# Patient Record
Sex: Male | Born: 1955 | Race: White | Hispanic: No | Marital: Married | Smoking: Former smoker
Health system: Southern US, Community
[De-identification: ages and names within clinical notes are randomized; demographics above are authoritative.]

## PROBLEM LIST (undated history)

## (undated) DIAGNOSIS — J449 Chronic obstructive pulmonary disease, unspecified: Secondary | ICD-10-CM

## (undated) DIAGNOSIS — C799 Secondary malignant neoplasm of unspecified site: Secondary | ICD-10-CM

## (undated) DIAGNOSIS — IMO0002 Reserved for concepts with insufficient information to code with codable children: Secondary | ICD-10-CM

## (undated) DIAGNOSIS — IMO0001 Reserved for inherently not codable concepts without codable children: Secondary | ICD-10-CM

## (undated) DIAGNOSIS — A4902 Methicillin resistant Staphylococcus aureus infection, unspecified site: Secondary | ICD-10-CM

## (undated) DIAGNOSIS — C801 Malignant (primary) neoplasm, unspecified: Secondary | ICD-10-CM

## (undated) DIAGNOSIS — N400 Enlarged prostate without lower urinary tract symptoms: Secondary | ICD-10-CM

## (undated) HISTORY — DX: Reserved for concepts with insufficient information to code with codable children: IMO0002

## (undated) HISTORY — PX: CYST EXCISION: SHX5701

## (undated) HISTORY — DX: Reserved for inherently not codable concepts without codable children: IMO0001

---

## 2003-07-27 ENCOUNTER — Emergency Department (HOSPITAL_COMMUNITY): Admission: EM | Admit: 2003-07-27 | Discharge: 2003-07-27 | Payer: Self-pay | Admitting: Emergency Medicine

## 2008-05-19 ENCOUNTER — Emergency Department (HOSPITAL_COMMUNITY): Admission: EM | Admit: 2008-05-19 | Discharge: 2008-05-19 | Payer: Self-pay | Admitting: Emergency Medicine

## 2011-03-01 LAB — GLUCOSE, CAPILLARY: Glucose-Capillary: 82 mg/dL (ref 70–99)

## 2012-11-07 ENCOUNTER — Encounter (HOSPITAL_COMMUNITY): Payer: Self-pay | Admitting: Emergency Medicine

## 2012-11-07 ENCOUNTER — Inpatient Hospital Stay (HOSPITAL_COMMUNITY)
Admission: AD | Admit: 2012-11-07 | Discharge: 2012-11-11 | DRG: 897 | Disposition: A | Discharge status: Home / Self Care | Payer: Federal, State, Local not specified - Other | Source: Intra-hospital | Attending: Psychiatry | Admitting: Psychiatry

## 2012-11-07 ENCOUNTER — Emergency Department (HOSPITAL_COMMUNITY)
Admission: EM | Admit: 2012-11-07 | Discharge: 2012-11-07 | Disposition: A | Discharge status: Other Acute Inpatient Hospital | Payer: Federal, State, Local not specified - Other | Attending: Emergency Medicine | Admitting: Emergency Medicine

## 2012-11-07 DIAGNOSIS — F1420 Cocaine dependence, uncomplicated: Secondary | ICD-10-CM | POA: Diagnosis present

## 2012-11-07 DIAGNOSIS — F1920 Other psychoactive substance dependence, uncomplicated: Secondary | ICD-10-CM

## 2012-11-07 DIAGNOSIS — F17200 Nicotine dependence, unspecified, uncomplicated: Secondary | ICD-10-CM | POA: Insufficient documentation

## 2012-11-07 DIAGNOSIS — F1020 Alcohol dependence, uncomplicated: Principal | ICD-10-CM | POA: Diagnosis present

## 2012-11-07 DIAGNOSIS — F10229 Alcohol dependence with intoxication, unspecified: Secondary | ICD-10-CM | POA: Insufficient documentation

## 2012-11-07 DIAGNOSIS — Z79899 Other long term (current) drug therapy: Secondary | ICD-10-CM

## 2012-11-07 LAB — CBC
HCT: 47.8 % (ref 39.0–52.0)
MCH: 33.5 pg (ref 26.0–34.0)
MCHC: 34.7 g/dL (ref 30.0–36.0)
MCV: 96.4 fL (ref 78.0–100.0)
Platelets: 154 10*3/uL (ref 150–400)
RDW: 13 % (ref 11.5–15.5)
WBC: 7.7 10*3/uL (ref 4.0–10.5)

## 2012-11-07 LAB — RAPID URINE DRUG SCREEN, HOSP PERFORMED
Amphetamines: NOT DETECTED
Cocaine: POSITIVE — AB
Opiates: NOT DETECTED

## 2012-11-07 LAB — COMPREHENSIVE METABOLIC PANEL
Albumin: 3.6 g/dL (ref 3.5–5.2)
BUN: 9 mg/dL (ref 6–23)
Calcium: 8.8 mg/dL (ref 8.4–10.5)
Chloride: 99 mEq/L (ref 96–112)
Creatinine, Ser: 0.9 mg/dL (ref 0.50–1.35)
Total Bilirubin: 0.8 mg/dL (ref 0.3–1.2)

## 2012-11-07 LAB — SALICYLATE LEVEL: Salicylate Lvl: <2 mg/dL — ABNORMAL LOW (ref 2.8–20.0)

## 2012-11-07 LAB — MAGNESIUM: Magnesium: 2.1 mg/dL (ref 1.5–2.5)

## 2012-11-07 LAB — ETHANOL: Alcohol, Ethyl (B): <11 mg/dL (ref 0–11)

## 2012-11-07 MED ORDER — LORAZEPAM 1 MG PO TABS
1.0000 mg | ORAL_TABLET | Freq: Three times a day (TID) | ORAL | Status: DC | PRN
  Administered 2012-11-07: 1 mg via ORAL
  Filled 2012-11-07: qty 1

## 2012-11-07 MED ORDER — ZOLPIDEM TARTRATE 5 MG PO TABS: 5.0000 mg | ORAL_TABLET | Freq: Every evening | ORAL | Status: DC | PRN

## 2012-11-07 MED ORDER — NICOTINE 21 MG/24HR TD PT24: 21.0000 mg | MEDICATED_PATCH | Freq: Every day | TRANSDERMAL | Status: DC

## 2012-11-07 MED ORDER — ALUM & MAG HYDROXIDE-SIMETH 200-200-20 MG/5ML PO SUSP: 30.0000 mL | ORAL | Status: DC | PRN

## 2012-11-07 MED ORDER — ACETAMINOPHEN 325 MG PO TABS: 650.0000 mg | ORAL_TABLET | ORAL | Status: DC | PRN

## 2012-11-07 MED ORDER — IBUPROFEN 600 MG PO TABS: 600.0000 mg | ORAL_TABLET | Freq: Three times a day (TID) | ORAL | Status: DC | PRN

## 2012-11-07 MED ORDER — ONDANSETRON HCL 4 MG PO TABS: 4.0000 mg | ORAL_TABLET | Freq: Three times a day (TID) | ORAL | Status: DC | PRN

## 2012-11-07 NOTE — Consult Note (Signed)
Reason for Consult:  Poly substance dependence Referring Physician:   Dr Cordelia Poche  Barry Cook is an 57 y.o. male.  HPI: 57 y.o.  Caucasian male with PMHx of polysubstance abuse presents today seeking inpatient behavioral assistance and detox from alcohol and cocaine. Pt states he last drank approx 12 eight-ounce beers yesterday from morning to evening time and last smoked about $200 plus worth of cocaine last night. He states he does drink about 12 eight-ounce beers every day especially when he is not working and uses cocaine about twice a week ("when I can afford it"). Pt denies SI/HI and auditory/visual hallucinations.  Pt states he was sober for about 5 years in the 1990s, quitting on his own, but went back to using because the desire to use was just too strong. Pt has been a polysubstance abuser for approx 40 years. Pt states his motivation for quitting now is because his substance abuse is affecting his relationship with his wife and son whom he live with in the same home.  Patient appears pleasant and cooperative and states he will like to continue treatment at a 30 days inpatient treatment facility.  He denies hx of psychiatric or medical illness.     History reviewed. No pertinent past medical history.  History reviewed. No pertinent past surgical history.  No family history on file.  Social History:  reports that he has been smoking Cigarettes.  He has been smoking about 1.00 pack per day. He does not have any smokeless tobacco history on file. He reports that he drinks about 7.2 ounces of alcohol per week. He reports that he uses illicit drugs (Cocaine).  Allergies: No Known Allergies  Medications: I have reviewed the patient's current medications.  Results for orders placed during the hospital encounter of 11/07/12 (from the past 48 hour(s))  CBC     Status: None   Collection Time    11/07/12  7:12 AM      Result Value Range   WBC 7.7  4.0 - 10.5 K/uL   RBC 4.96  4.22 -  5.81 MIL/uL   Hemoglobin 16.6  13.0 - 17.0 g/dL   HCT 11.9  14.7 - 82.9 %   MCV 96.4  78.0 - 100.0 fL   MCH 33.5  26.0 - 34.0 pg   MCHC 34.7  30.0 - 36.0 g/dL   RDW 56.2  13.0 - 86.5 %   Platelets 154  150 - 400 K/uL  COMPREHENSIVE METABOLIC PANEL     Status: Abnormal   Collection Time    11/07/12  7:12 AM      Result Value Range   Sodium 134 (*) 135 - 145 mEq/L   Potassium 4.1  3.5 - 5.1 mEq/L   Chloride 99  96 - 112 mEq/L   CO2 27  19 - 32 mEq/L   Glucose, Bld 89  70 - 99 mg/dL   BUN 9  6 - 23 mg/dL   Creatinine, Ser 7.84  0.50 - 1.35 mg/dL   Calcium 8.8  8.4 - 69.6 mg/dL   Total Protein 6.7  6.0 - 8.3 g/dL   Albumin 3.6  3.5 - 5.2 g/dL   AST 20  0 - 37 U/L   ALT 14  0 - 53 U/L   Alkaline Phosphatase 66  39 - 117 U/L   Total Bilirubin 0.8  0.3 - 1.2 mg/dL   GFR calc non Af Amer >90  >90 mL/min   GFR calc Af Amer >  90  >90 mL/min   Comment:            The eGFR has been calculated     using the CKD EPI equation.     This calculation has not been     validated in all clinical     situations.     eGFR's persistently     <90 mL/min signify     possible Chronic Kidney Disease.  ETHANOL     Status: None   Collection Time    11/07/12  7:12 AM      Result Value Range   Alcohol, Ethyl (B) <11  0 - 11 mg/dL   Comment:            LOWEST DETECTABLE LIMIT FOR     SERUM ALCOHOL IS 11 mg/dL     FOR MEDICAL PURPOSES ONLY  ACETAMINOPHEN LEVEL     Status: None   Collection Time    11/07/12  7:12 AM      Result Value Range   Acetaminophen (Tylenol), Serum <15.0  10 - 30 ug/mL   Comment:            THERAPEUTIC CONCENTRATIONS VARY     SIGNIFICANTLY. A RANGE OF 10-30     ug/mL MAY BE AN EFFECTIVE     CONCENTRATION FOR MANY PATIENTS.     HOWEVER, SOME ARE BEST TREATED     AT CONCENTRATIONS OUTSIDE THIS     RANGE.     ACETAMINOPHEN CONCENTRATIONS     >150 ug/mL AT 4 HOURS AFTER     INGESTION AND >50 ug/mL AT 12     HOURS AFTER INGESTION ARE     OFTEN ASSOCIATED WITH TOXIC      REACTIONS.  SALICYLATE LEVEL     Status: Abnormal   Collection Time    11/07/12  7:12 AM      Result Value Range   Salicylate Lvl <2.0 (*) 2.8 - 20.0 mg/dL  MAGNESIUM     Status: None   Collection Time    11/07/12  7:12 AM      Result Value Range   Magnesium 2.1  1.5 - 2.5 mg/dL  URINE RAPID DRUG SCREEN (HOSP PERFORMED)     Status: Abnormal   Collection Time    11/07/12  7:21 AM      Result Value Range   Opiates NONE DETECTED  NONE DETECTED   Cocaine POSITIVE (*) NONE DETECTED   Benzodiazepines NONE DETECTED  NONE DETECTED   Amphetamines NONE DETECTED  NONE DETECTED   Tetrahydrocannabinol NONE DETECTED  NONE DETECTED   Barbiturates NONE DETECTED  NONE DETECTED   Comment:            DRUG SCREEN FOR MEDICAL PURPOSES     ONLY.  IF CONFIRMATION IS NEEDED     FOR ANY PURPOSE, NOTIFY LAB     WITHIN 5 DAYS.                LOWEST DETECTABLE LIMITS     FOR URINE DRUG SCREEN     Drug Class       Cutoff (ng/mL)     Amphetamine      1000     Barbiturate      200     Benzodiazepine   200     Tricyclics       300     Opiates          300     Cocaine  300     THC              50    No results found.  Review of Systems  Constitutional: Negative.  Negative for fever, chills, weight loss, malaise/fatigue and diaphoresis.  HENT: Negative.  Negative for hearing loss, ear pain, nosebleeds, congestion, sore throat, neck pain, tinnitus and ear discharge.   Eyes: Negative.  Negative for blurred vision, double vision, photophobia, pain, discharge and redness.  Respiratory: Negative.  Negative for cough, hemoptysis, sputum production, shortness of breath, wheezing and stridor.   Cardiovascular: Negative.  Negative for chest pain, palpitations, orthopnea, leg swelling and PND.  Gastrointestinal: Negative.  Negative for heartburn, nausea, vomiting, abdominal pain, diarrhea, constipation, blood in stool and melena.  Genitourinary: Negative.  Negative for dysuria, urgency, frequency,  hematuria and flank pain.  Musculoskeletal: Negative.  Negative for myalgias, back pain, joint pain and falls.  Skin: Negative.  Negative for itching and rash.  Neurological: Negative.  Negative for dizziness, tingling, tremors, sensory change, speech change, focal weakness, seizures, loss of consciousness, weakness and headaches.  Endo/Heme/Allergies: Negative.  Negative for environmental allergies and polydipsia. Does not bruise/bleed easily.  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 Negative for suicidal ideas, hallucinations and memory loss. The patient is nervous/anxious (Rates his anxiety 6/10 due to finiancial difficulty and paying his bills yet uses his money to buy cocaine and alcohol) and has insomnia (Roports poor sleep but was medicated last night with [place]).    Blood pressure 143/91, pulse 67, temperature 97.8 F (36.6 C), temperature source Oral, resp. rate 17, height 6\' 1"  (1.854 m), weight 74.844 kg (165 lb), SpO2 97.00%. Physical Exam  Nursing note and vitals reviewed. Constitutional: He is oriented to person, place, and time. He appears well-developed and well-nourished. No distress.  HENT:  Head: Normocephalic and atraumatic.  Eyes: Conjunctivae and EOM are normal. Right eye exhibits no discharge. Left eye exhibits no discharge.  Neck: Normal range of motion. Neck supple. No JVD present. No tracheal deviation present. No thyromegaly present.  Cardiovascular: Normal rate, regular rhythm, normal heart sounds and intact distal pulses.  Exam reveals no friction rub.   Respiratory: Effort normal and breath sounds normal. No respiratory distress. He has no wheezes. He has no rales. He exhibits no tenderness.  GI: Soft. Bowel sounds are normal. He exhibits no distension and no mass. There is no tenderness. There is no rebound  and no guarding.  Musculoskeletal: Normal range of motion. He exhibits no edema and no tenderness.  Lymphadenopathy:    He has no cervical adenopathy.  Neurological: He is alert and oriented to person, place, and time. He has normal reflexes. He displays normal reflexes. He exhibits normal muscle tone. Coordination normal.  Skin: Skin is warm. No rash noted. He is diaphoretic. No erythema. No pallor.    Assessment/Plan:  Will consult with Dr Lucianne Muss A but meanwhile plan to admit patient to our inpatient Chemical dependency unit for alcohol detoxification. We will use librium detox protocol.   Dahlia Byes, C  PMHNP-BC 11/07/2012, 11:28 AM

## 2012-11-07 NOTE — ED Provider Notes (Signed)
History     CSN: [id number]  Arrival date & time 11/07/12  0703   First MD Initiated Contact with Patient 11/07/12 [phone]      Chief Complaint  Patient presents with  . Medical Clearance    (Consider location/radiation/quality/duration/timing/severity/associated sxs/prior treatment) HPI Comments: 57 y.o. Male with PMHx of polysubstance abuse presents today seeking inpatient behavioral assistance and detox from alcohol and cocaine. Pt states he last drank approx 12 eight-ounce beers yesterday from the hours of 7a - 7p and last smoked about $250 worth of cocaine last night. He states he does drink about 12 eight-ounce beers every day ("if I'm not workin, I'm drinkin) and uses cocaine about twice a week ("when I can afford it"). Pt denies SI/HI and audio/visual hallucinations.   Pt states he was sober for about 5 years in the 1990s, quitting on his own, but went back to using because the desire to use was just too strong. Pt has been a polysubstance abuser for approx 40 years. Pt states he wants to quit now because of what his habit is doing to his wife and 68 year old grandson, both of whom live with him. Pt becomes tearful when talking about how his polysubstance abuse is affecting his family.    History reviewed. No pertinent past medical history.  History reviewed. No pertinent past surgical history.  No family history on file.  History  Substance Use Topics  . Smoking status: Current Every Day Smoker -- 1.00 packs/day    Types: Cigarettes  . Smokeless tobacco: Not on file  . Alcohol Use: 7.2 oz/week    12 Cans of beer per week      Review of Systems  Constitutional: Negative for fever and diaphoresis.  HENT: Negative for neck pain and neck stiffness.   Eyes: Negative for visual disturbance.  Respiratory: Negative for apnea, chest tightness and shortness of breath.   Cardiovascular: Negative for chest pain and palpitations.  Gastrointestinal: Negative for nausea, vomiting,  diarrhea and constipation.  Genitourinary: Negative for dysuria.  Musculoskeletal: Negative for gait problem.  Skin: Negative for rash.  Neurological: Negative for dizziness, weakness, light-headedness, numbness and headaches.    Allergies  Review of patient's allergies indicates no known allergies.  Home Medications  No current outpatient prescriptions on file.  BP 153/88  Pulse 84  Temp(Src) 98.3 F (36.8 C) (Oral)  Resp 18  Ht 6\' 1"  (1.854 m)  Wt 165 lb (74.844 kg)  BMI 21.77 kg/m2  SpO2 96%  Physical Exam  Nursing note and vitals reviewed. Constitutional: He is oriented to person, place, and time. No distress.  disheveled   HENT:  Head: Normocephalic and atraumatic.  Eyes: Conjunctivae and EOM are normal.  Neck: Normal range of motion. Neck supple.  No meningeal signs  Cardiovascular: Normal rate, regular rhythm and normal heart sounds.  Exam reveals no gallop and no friction rub.   No murmur heard. Pulmonary/Chest: Effort normal.  Abdominal: Soft. Bowel sounds are normal. There is no tenderness.  Musculoskeletal: Normal range of motion. He exhibits no edema and no tenderness.  FROM to upper and lower extremities  Neurological: He is alert and oriented to person, place, and time. No cranial nerve deficit.  Speech is clear and goal oriented, follows commands Sensation normal to light touch and two point discrimination Moves extremities without ataxia, coordination intact Normal gait and balance Normal strength in upper and lower extremities bilaterally including dorsiflexion and plantar flexion, strong and equal grip strength   Skin: Skin  is warm and dry. He is not diaphoretic.  Psychiatric:  Tearful at times    ED Course  Procedures (including critical care time)  Labs Reviewed  COMPREHENSIVE METABOLIC PANEL - Abnormal; Notable for the following:    Sodium 134 (*)    All other components within normal limits  URINE RAPID DRUG SCREEN (HOSP PERFORMED) -  Abnormal; Notable for the following:    Cocaine POSITIVE (*)    All other components within normal limits  SALICYLATE LEVEL - Abnormal; Notable for the following:    Salicylate Lvl <2.0 (*)    All other components within normal limits  ETHANOL  ACETAMINOPHEN LEVEL  MAGNESIUM  CBC    Filed Vitals:   11/07/12 0710  BP: 153/88  Pulse: 84  Temp: 98.3 F (36.8 C)  Resp: 18    No results found.   No diagnosis found.    MDM  Pt presents to the ED by self for medical clearance seeking inpatient behavioral assistance and detox from alcohol and cocaine.  Pt is not currently having SI or HI ideations. The patient currently does not have any acute physical complaints and is in no acute distress.  PE was benign. Vitals are WNL. Psych hold orders and CIWA protocol placed. Labs have been ordered and are pending. Will move to Psych ED awaiting results and request ACT consult.   Pt did test positive for cocaine and <11 ETOH.   Glade Nurse, PA-C 11/07/12 [phone]

## 2012-11-07 NOTE — BH Assessment (Signed)
Assessment Note   Barry Cook is an 57 y.o. male.   Pt consumes approximately 150 to 200 oz of beer daily.  Pt works during the day and then drinks when gets home.  Pt family encouraging pt to get help.  Pt says "Its time to get help."  Pt denies HI, SI, AVH and reports no prior MH hx.  Pt also reports no prior SA tx.  Pt is cooperative and able to perform ADLs.  MSE by ACT:  Good eye contact, Ox3, judgment appeared intact, CIWA 3, anxious, affect within appropriate range, speech clear.  Recommendation:  Pt meets criteria for alcohol detox.  Pt referred to [facility] for review as well as [facility].  Pt wants to follow detox with 14 day rehab.    Axis I: Alcohol Dep. Axis II: Deferred Axis III: History reviewed. No pertinent past medical history. Axis IV: other psychosocial or environmental problems Axis V: 41-50 serious symptoms  Past Medical History: History reviewed. No pertinent past medical history.  History reviewed. No pertinent past surgical history.  Family History: No family history on file.  Social History:  reports that he has been smoking Cigarettes.  He has been smoking about 1.00 pack per day. He does not have any smokeless tobacco history on file. He reports that he drinks about 7.2 ounces of alcohol per week. He reports that he uses illicit drugs (Cocaine).  Additional Social History:  Alcohol / Drug Use Pain Medications: na Prescriptions: na Over the Counter: na History of alcohol / drug use?: Yes Longest period of sobriety (when/how long): none Substance #1 Name of Substance 1: alcohol 1 - Age of First Use: teen 1 - Amount (size/oz): varies; 200 oz 1 - Frequency: daily 1 - Duration: years 1 - Last Use / Amount: 11-06-12 Substance #2 Name of Substance 2: Cocaine 2 - Age of First Use: 60s 2 - Amount (size/oz): varies 2 - Frequency: 3x per yr 2 - Duration: ongoing 2 - Last Use / Amount: 11-05-12  CIWA: CIWA-Ar BP: 143/91 mmHg Pulse Rate: 67 Nausea and  Vomiting: no nausea and no vomiting Tactile Disturbances: none Tremor: no tremor Auditory Disturbances: not present Paroxysmal Sweats: no sweat visible Visual Disturbances: not present Anxiety: two Headache, Fullness in Head: very mild Agitation: normal activity Orientation and Clouding of Sensorium: oriented and can do serial additions CIWA-Ar Total: 3 COWS:    Allergies: No Known Allergies  Home Medications:  (Not in a hospital admission)  OB/GYN Status:  No LMP for male patient.  General Assessment Data Location of Assessment: [facility] ED Living Arrangements: Spouse/significant other Can pt return to current living arrangement?: Yes Admission Status: Voluntary Is patient capable of signing voluntary admission?: Yes Transfer from: Acute Hospital Referral Source: MD  Education Status Is patient currently in school?: No  Risk to self Suicidal Ideation: No Suicidal Intent: No Is patient at risk for suicide?: No Suicidal Plan?: No Access to Means: No What has been your use of drugs/alcohol within the last 12 months?: yes Previous Attempts/Gestures: No How many times?: 0 Other Self Harm Risks: na Triggers for Past Attempts: None known Intentional Self Injurious Behavior: None Family Suicide History: No Persecutory voices/beliefs?: No Depression: No Substance abuse history and/or treatment for substance abuse?: Yes Suicide prevention information given to non-admitted patients: Yes  Risk to Others Homicidal Ideation: No Thoughts of Harm to Others: No Current Homicidal Intent: No Current Homicidal Plan: No Access to Homicidal Means: No Identified Victim: na History of harm to  others?: No Assessment of Violence: None Noted Violent Behavior Description: cooperative Does patient have access to weapons?: No Criminal Charges Pending?: No Does patient have a court date: No  Psychosis Hallucinations: None noted Delusions: None noted  Mental Status  Report Appear/Hygiene: Disheveled Eye Contact: Good Motor Activity: Agitation;Tremors;Freedom of movement Speech: Logical/coherent Level of Consciousness: Alert Mood: Anxious Affect: Anxious Anxiety Level: Minimal Thought Processes: Coherent Judgement: Unimpaired Orientation: Person;Place;Situation;Appropriate for developmental age Obsessive Compulsive Thoughts/Behaviors: None  Cognitive Functioning Concentration: Decreased Memory: Recent Intact;Remote Intact IQ: Average Insight: Fair Impulse Control: Fair Appetite: Fair Weight Loss: 0 Weight Gain: 0 Sleep: No Change Total Hours of Sleep: 6 Vegetative Symptoms: None  ADLScreening [facility] Assessment Services) Patient's cognitive ability adequate to safely complete daily activities?: Yes Patient able to express need for assistance with ADLs?: Yes Independently performs ADLs?: Yes (appropriate for developmental age)  Abuse/Neglect [facility]) Physical Abuse: Denies Verbal Abuse: Denies Sexual Abuse: Denies  Prior Inpatient Therapy Prior Inpatient Therapy: No  Prior Outpatient Therapy Prior Outpatient Therapy: No  ADL Screening (condition at time of admission) Patient's cognitive ability adequate to safely complete daily activities?: Yes Patient able to express need for assistance with ADLs?: Yes Independently performs ADLs?: Yes (appropriate for developmental age) Weakness of Legs: None Weakness of Arms/Hands: None  Home Assistive Devices/Equipment Home Assistive Devices/Equipment: None  Therapy Consults (therapy consults require a physician order) PT Evaluation Needed: No OT Evalulation Needed: No SLP Evaluation Needed: No Abuse/Neglect Assessment (Assessment to be complete while patient is alone) Physical Abuse: Denies Verbal Abuse: Denies Sexual Abuse: Denies Exploitation of patient/patient's resources: Denies Self-Neglect: Denies Values / Beliefs Cultural Requests During Hospitalization: None Spiritual  Requests During Hospitalization: None Consults Spiritual Care Consult Needed: No Social Work Consult Needed: No Merchant navy officer (For Healthcare) Advance Directive: Patient does not have advance directive Pre-existing out of facility DNR order (yellow form or pink MOST form): No    Additional Information 1:1 In Past 12 Months?: No CIRT Risk: No Elopement Risk: No Does patient have medical clearance?: Yes     Disposition:  Disposition Initial Assessment Completed for this Encounter: Yes Disposition of Patient: Inpatient treatment program;Referred to ([facility] or [facility]) Type of inpatient treatment program: Adult Patient referred to: Other (Comment) ([facility] or [facility])  On Site Evaluation by:   Reviewed with Physician:     Macon Large 11/07/2012 9:14 AM

## 2012-11-07 NOTE — ED Notes (Signed)
Belongings placed in locker 36  

## 2012-11-07 NOTE — [facility] Counselor (Signed)
Pt has been accepted to [facility] by Welford Roche NP to [facility] bed 500-2. Signed support paperwork faxed to [facility] and originals placed in chart. Tresa Endo RN and EDP Silverio Lay notified. Per Tim MHT at [facility], pt is to come over as close as possible to 2100.  Evette Cristal, [place] Assessment Counselor

## 2012-11-07 NOTE — ED Notes (Signed)
Pt has been scrubbed/wanded, and put into scrubs.

## 2012-11-07 NOTE — ED Notes (Signed)
Pt states that he wants detox from cocaine and alcohol.  States that he last used cocaine and alcohol last night.  Drinks at least 10-12 beers every day.  Denies SI/HI.

## 2012-11-07 NOTE — ED Provider Notes (Signed)
Patient accepted at [facility] under Dr. Lucianne Muss. Stable for transfer   Richardean Canal, MD 11/07/12 [phone]

## 2012-11-07 NOTE — ED Notes (Signed)
Belongings (1bag) placed on locker 32

## 2012-11-08 ENCOUNTER — Encounter (HOSPITAL_COMMUNITY): Payer: Self-pay | Admitting: *Deleted

## 2012-11-08 DIAGNOSIS — F1420 Cocaine dependence, uncomplicated: Secondary | ICD-10-CM

## 2012-11-08 DIAGNOSIS — F1020 Alcohol dependence, uncomplicated: Principal | ICD-10-CM

## 2012-11-08 MED ORDER — NICOTINE 21 MG/24HR TD PT24
21.0000 mg | MEDICATED_PATCH | Freq: Every day | TRANSDERMAL | Status: DC
  Administered 2012-11-08 – 2012-11-11 (×4): 21 mg via TRANSDERMAL
  Filled 2012-11-08 (×2): qty 1
  Filled 2012-11-08: qty 14
  Filled 2012-11-08 (×5): qty 1

## 2012-11-08 MED ORDER — ALUM & MAG HYDROXIDE-SIMETH 200-200-20 MG/5ML PO SUSP
30.0000 mL | ORAL | Status: DC | PRN
  Administered 2012-11-10: 30 mL via ORAL

## 2012-11-08 MED ORDER — MAGNESIUM HYDROXIDE 400 MG/5ML PO SUSP: 30.0000 mL | Freq: Every day | ORAL | Status: DC | PRN

## 2012-11-08 MED ORDER — CHLORDIAZEPOXIDE HCL 25 MG PO CAPS
25.0000 mg | ORAL_CAPSULE | Freq: Four times a day (QID) | ORAL | Status: DC | PRN
  Administered 2012-11-08 (×2): 25 mg via ORAL
  Filled 2012-11-08 (×2): qty 1

## 2012-11-08 MED ORDER — ACETAMINOPHEN 325 MG PO TABS
650.0000 mg | ORAL_TABLET | Freq: Four times a day (QID) | ORAL | Status: DC | PRN
  Administered 2012-11-08 – 2012-11-11 (×2): 650 mg via ORAL

## 2012-11-08 MED ORDER — TRAZODONE HCL 50 MG PO TABS
50.0000 mg | ORAL_TABLET | Freq: Every evening | ORAL | Status: DC | PRN
  Administered 2012-11-08 – 2012-11-10 (×3): 50 mg via ORAL
  Filled 2012-11-08: qty 1
  Filled 2012-11-08: qty 6
  Filled 2012-11-08 (×2): qty 1

## 2012-11-08 NOTE — Progress Notes (Signed)
D. Pt has been up and has been active and has been participating in various milieu activities today, pt spoke about his drinking and how it has been affecting him and his relationships. Pt does endorse some cravings as well as some anxiety but states overall that he is feeling ok. Pt has endorsed some depression but has denied any SI. A. Support and encouragement provided, medication education completed. R. Pt verbalized understanding, will continue to monitor.

## 2012-11-08 NOTE — H&P (Signed)
Psychiatric Admission Assessment Adult  Patient Identification:  MCCALL LOMAX Date of Evaluation:  11/08/2012 Chief Complaint:  ETOH DEPENDENCE History of Present Illness: Mr. Link Burgeson is a 57 y/o male with a past psychiatric history significant for Alcohol dependence and Cocaine Dependence.  The patient requested detox as he reports it was affecting his relationship with his wife and son.  He reports his son and his relationship with his wife has been his biggest stress.   Elements:  Location:  Inpatient psychitric unit. Quality:  Report symptoms of withdrawals with continued craving.. Severity:  Causing significant stress and intefering with daily activities. Timing:  Daily and constant. Duration:  Over 40 years with recent exacerbation. Context:  Affecting all aspects of life particulary family life.. Associated Signs/Synptoms: Depression Symptoms:  depressed mood, anhedonia, insomnia, hopelessness, impaired memory, recurrent thoughts of death, (Hypo) Manic Symptoms: Patient denies Anxiety Symptoms:  Excessive Worry, Psychotic Symptoms: Patient denies PTSD Symptoms: Negative  Psychiatric Specialty Exam: Physical Exam  Nursing note and vitals reviewed. Constitutional: He appears well-developed and well-nourished. No distress.  Skin: He is not diaphoretic.    ROS  Blood pressure 130/87, pulse 90, temperature 98.7 F (37.1 C), temperature source Oral, resp. rate 16, height 5\' 11"  (1.803 m), weight 68.493 kg (151 lb).Body mass index is 21.07 kg/(m^2).  General Appearance: Disheveled  Eye Solicitor::  Fair  Speech:  Clear and Coherent and Normal Rate  Volume:  Normal  Mood:  Anxious  Affect:  Appropriate, Congruent and Full Range  Thought Process:  Coherent, Goal Directed and Intact  Orientation:  Full (Time, Place, and Person)  Thought Content:  WDL and Hallucinations: None  Suicidal Thoughts:  No  Homicidal Thoughts:  No  Memory:  Immediate;   Good Recent;    Good Remote;   Good  Judgement:  Impaired  Insight:  Fair  Psychomotor Activity:  Normal  Concentration:  Fair  Recall:  Good  Akathisia:  No  Handed:  Right  AIMS (if indicated):   Not indicated  Assets:  Community education officer  Sleep:  Number of Hours: 4.5    Past Psychiatric History: Diagnosis: Polysubstance Abuse  Hospitalizations: Patient denies  Outpatient Care: Patient denies  Substance Abuse Care:Patient denies  Self-Mutilation:Patient denies  Suicidal Attempts: Patient denies.  Violent Behaviors: Patient denies.   Past Medical History:  History reviewed. No pertinent past medical history. Loss of Consciousness:  Yes from intoxication Seizure History:  Patient denies Cardiac History:  Patient denies Traumatic Brain Injury:  Patient denies Allergies:  No Known Allergies PTA Medications: No prescriptions prior to admission    Previous Psychotropic Medications:  Medication/Dose  Patient denies.   Substance Abuse History in the last 12 months:  yes  Consequences of Substance Abuse: Medical Consequences:  Patient denies Legal Consequences:  Patient denies Family Consequences:  Yes-family problems  Social History:  reports that he has been smoking Cigarettes.  He has been smoking about 1.00 pack per day. He does not have any smokeless tobacco history on file. He reports that he drinks about 7.2 ounces of alcohol per week. He reports that he uses illicit drugs (Cocaine). Additional Social History:  Current Place of Residence:  [place], [place] Place of Birth:  [place], [place] Family Members:Lives with wife and grandson Marital Status:  Married Children:4  Sons:2  Daughters:2 Relationships: The patient reports his oldest daughter is his main source of emtional support. Education:  GED Educational Problems/Performance: Patient reports difficulty in school Religious Beliefs/Practices: Prays History  of Abuse: Patient denies Occupational  Experiences: Catering manager History:  None. Legal History: Patient denies Hobbies/Interests: Patient denies.  Family History:  History reviewed. No pertinent family history.  Results for orders placed during the hospital encounter of 11/07/12 (from the past 72 hour(s))  CBC     Status: None   Collection Time    11/07/12  7:12 AM      Result Value Range   WBC 7.7  4.0 - 10.5 K/uL   RBC 4.96  4.22 - 5.81 MIL/uL   Hemoglobin 16.6  13.0 - 17.0 g/dL   HCT 16.1  09.6 - 04.5 %   MCV 96.4  78.0 - 100.0 fL   MCH 33.5  26.0 - 34.0 pg   MCHC 34.7  30.0 - 36.0 g/dL   RDW 40.9  81.1 - 91.4 %   Platelets 154  150 - 400 K/uL  COMPREHENSIVE METABOLIC PANEL     Status: Abnormal   Collection Time    11/07/12  7:12 AM      Result Value Range   Sodium 134 (*) 135 - 145 mEq/L   Potassium 4.1  3.5 - 5.1 mEq/L   Chloride 99  96 - 112 mEq/L   CO2 27  19 - 32 mEq/L   Glucose, Bld 89  70 - 99 mg/dL   BUN 9  6 - 23 mg/dL   Creatinine, Ser 7.82  0.50 - 1.35 mg/dL   Calcium 8.8  8.4 - 95.6 mg/dL   Total Protein 6.7  6.0 - 8.3 g/dL   Albumin 3.6  3.5 - 5.2 g/dL   AST 20  0 - 37 U/L   ALT 14  0 - 53 U/L   Alkaline Phosphatase 66  39 - 117 U/L   Total Bilirubin 0.8  0.3 - 1.2 mg/dL   GFR calc non Af Amer >90  >90 mL/min   GFR calc Af Amer >90  >90 mL/min   Comment:            The eGFR has been calculated     using the CKD EPI equation.     This calculation has not been     validated in all clinical     situations.     eGFR's persistently     <90 mL/min signify     possible Chronic Kidney Disease.  ETHANOL     Status: None   Collection Time    11/07/12  7:12 AM      Result Value Range   Alcohol, Ethyl (B) <11  0 - 11 mg/dL   Comment:            LOWEST DETECTABLE LIMIT FOR     SERUM ALCOHOL IS 11 mg/dL     FOR MEDICAL PURPOSES ONLY  ACETAMINOPHEN LEVEL     Status: None   Collection Time    11/07/12  7:12 AM      Result Value Range   Acetaminophen (Tylenol), Serum <15.0  10 - 30 ug/mL    Comment:            THERAPEUTIC CONCENTRATIONS VARY     SIGNIFICANTLY. A RANGE OF 10-30     ug/mL MAY BE AN EFFECTIVE     CONCENTRATION FOR MANY PATIENTS.     HOWEVER, SOME ARE BEST TREATED     AT CONCENTRATIONS OUTSIDE THIS     RANGE.     ACETAMINOPHEN CONCENTRATIONS     >150 ug/mL AT 4 HOURS AFTER  INGESTION AND >50 ug/mL AT 12     HOURS AFTER INGESTION ARE     OFTEN ASSOCIATED WITH TOXIC     REACTIONS.  SALICYLATE LEVEL     Status: Abnormal   Collection Time    11/07/12  7:12 AM      Result Value Range   Salicylate Lvl <2.0 (*) 2.8 - 20.0 mg/dL  MAGNESIUM     Status: None   Collection Time    11/07/12  7:12 AM      Result Value Range   Magnesium 2.1  1.5 - 2.5 mg/dL  URINE RAPID DRUG SCREEN (HOSP PERFORMED)     Status: Abnormal   Collection Time    11/07/12  7:21 AM      Result Value Range   Opiates NONE DETECTED  NONE DETECTED   Cocaine POSITIVE (*) NONE DETECTED   Benzodiazepines NONE DETECTED  NONE DETECTED   Amphetamines NONE DETECTED  NONE DETECTED   Tetrahydrocannabinol NONE DETECTED  NONE DETECTED   Barbiturates NONE DETECTED  NONE DETECTED   Comment:            DRUG SCREEN FOR MEDICAL PURPOSES     ONLY.  IF CONFIRMATION IS NEEDED     FOR ANY PURPOSE, NOTIFY LAB     WITHIN 5 DAYS.                LOWEST DETECTABLE LIMITS     FOR URINE DRUG SCREEN     Drug Class       Cutoff (ng/mL)     Amphetamine      1000     Barbiturate      200     Benzodiazepine   200     Tricyclics       300     Opiates          300     Cocaine          300     THC              50   Psychological Evaluations:  Assessment:   AXIS I:  Alcohol dependence and Cocaine Dependence AXIS II:  No diagnosis AXIS III:  History reviewed. No pertinent past medical history. AXIS IV:  other psychosocial or environmental problems and problems with primary support group AXIS V:  21-30 behavior considerably influenced by delusions or hallucinations OR serious impairment in judgment,  communication OR inability to function in almost all areas  Treatment Plan/Recommendations:   As noted below.  Treatment Plan Summary: Daily contact with patient to assess and evaluate symptoms and progress in treatment Medication management Current Medications:  Current Facility-Administered Medications  Medication Dose Route Frequency Provider Last Rate Last Dose  . acetaminophen (TYLENOL) tablet 650 mg  650 mg Oral Q6H PRN Jorje Guild, PA-C   650 mg at 11/08/12 0843  . alum & mag hydroxide-simeth (MAALOX/MYLANTA) 200-200-20 MG/5ML suspension 30 mL  30 mL Oral Q4H PRN Jorje Guild, PA-C      . chlordiazePOXIDE (LIBRIUM) capsule 25 mg  25 mg Oral Q6H PRN Jorje Guild, PA-C   25 mg at 11/08/12 0843  . magnesium hydroxide (MILK OF MAGNESIA) suspension 30 mL  30 mL Oral Daily PRN Jorje Guild, PA-C      . nicotine (NICODERM CQ - dosed in mg/24 hours) patch 21 mg  21 mg Transdermal Daily Nehemiah Settle, MD   21 mg at 11/08/12 0900    Observation Level/Precautions:  Detox  Laboratory:  Reviewed  Psychotherapy:  Patient will go to group and individual meetings and be allowed to go to AA  Medications:  Continue detox program.  Consultations:  None  Discharge Concerns:  Relapse, ability to go to AA due to financial stressors as wife is on disability.  Estimated LOS: 5-7 days  Other:  Referral to AA and outpatient program.   I certify that inpatient services furnished can reasonably be expected to improve the patient's condition.   Shaili Donalson 6/15/20141:10 PM

## 2012-11-08 NOTE — [facility] Group Notes (Signed)
[facility] LCSW Group Therapy  11/08/2012  10:00  Type of Therapy:  Group Therapy  Participation Level:  Active  Participation Quality:  Attentive  Affect:  Appropriate  Cognitive:  Oriented  Insight:  Limited  Engagement in Therapy:  Engaged  Modes of Intervention:  Discussion, Exploration and Socialization  Summary of Progress/Problems:   The main focus of today's process group was to identify the patient's current support system and decide on other supports that can be put in place. Four definitions/levels of support were discussed and an exercise was utilized to show how much stronger we become with additional supports. An emphasis was placed on using counselor, doctor, therapy groups, 12-step groups, and problem-specific support groups to expand supports, as well as doing something different than has been done before. Barry Cook admitted to having no supports, and is not sure how to go about finding any.  Group members pointed out that his willingness to go to AA or to rehab will net him the support he needs to fight his addiction.  [name], LCSW 11/08/2012 2:55 PM
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
## 2012-11-08 NOTE — Progress Notes (Signed)
Psychoeducational Group Note  Date:  11/08/2012 Time:  1015  Group Topic/Focus:  Making Healthy Choices:   The focus of this group is to help patients identify negative/unhealthy choices they were using prior to admission and identify positive/healthier coping strategies to replace them upon discharge.  Participation Level:  Active  Participation Quality:  Appropriate  Affect:  Depressed  Cognitive:  Alert  Insight:  Improving  Engagement in Group:  Improving  Additional Comments:    Maleki Hippe A 11/08/2012 

## 2012-11-08 NOTE — Tx Team (Signed)
Initial Interdisciplinary Treatment Plan  PATIENT STRENGTHS: (choose at least two) Active sense of humor Average or above average intelligence Capable of independent living General fund of knowledge Supportive family/friends  PATIENT STRESSORS: Substance abuse   PROBLEM LIST: Problem List/Patient Goals Date to be addressed Date deferred Reason deferred Estimated date of resolution  Substance abuse  11/08/12                                                       DISCHARGE CRITERIA:  Withdrawal symptoms are absent or subacute and managed without 24-hour nursing intervention  PRELIMINARY DISCHARGE PLAN: Attend 12-step recovery group Return to previous living arrangement Return to previous work or school arrangements  PATIENT/FAMIILY INVOLVEMENT: This treatment plan has been presented to and reviewed with the patient, Bailey, Kolbe 11/08/2012, 1:02 AM

## 2012-11-08 NOTE — [facility] Suicide Risk Assessment (Signed)
Suicide Risk Assessment  Admission Assessment     Nursing information obtained from:  Patient Demographic factors:  Male;Caucasian Current Mental Status:  NA Loss Factors:  NA Historical Factors:  NA Risk Reduction Factors:  Living with another person, especially a relative  CLINICAL FACTORS:   Alcohol/Substance Abuse/Dependencies  COGNITIVE FEATURES THAT CONTRIBUTE TO RISK:  Thought constriction (tunnel vision)    SUICIDE RISK:   Minimal: No identifiable suicidal ideation.  Patients presenting with no risk factors but with morbid ruminations; may be classified as minimal risk based on the severity of the depressive symptoms  PLAN OF CARE: Assessment:   AXIS I:  Alcohol dependence and Cocaine Dependence AXIS II:  No diagnosis AXIS III:  History reviewed. No pertinent past medical history. AXIS IV:  other psychosocial or environmental problems and problems with primary support group AXIS V:  21-30 behavior considerably influenced by delusions or hallucinations OR serious impairment in judgment, communication OR inability to function in almost all areas  Treatment Plan/Recommendations:   As noted below.  Treatment Plan Summary: Daily contact with patient to assess and evaluate symptoms and progress in treatment Medication management Current Medications:  Current Facility-Administered Medications  Medication Dose Route Frequency Provider Last Rate Last Dose  . acetaminophen (TYLENOL) tablet 650 mg  650 mg Oral Q6H PRN Jorje Guild, PA-C   650 mg at 11/08/12 0843  . alum & mag hydroxide-simeth (MAALOX/MYLANTA) 200-200-20 MG/5ML suspension 30 mL  30 mL Oral Q4H PRN Jorje Guild, PA-C      . chlordiazePOXIDE (LIBRIUM) capsule 25 mg  25 mg Oral Q6H PRN Jorje Guild, PA-C   25 mg at 11/08/12 0843  . magnesium hydroxide (MILK OF MAGNESIA) suspension 30 mL  30 mL Oral Daily PRN Jorje Guild, PA-C      . nicotine (NICODERM CQ - dosed in mg/24 hours) patch 21 mg  21 mg Transdermal Daily Nehemiah Settle, MD   21 mg at 11/08/12 0900    Observation Level/Precautions:  Detox  Laboratory:  Reviewed  Psychotherapy:  Patient will go to group and individual meetings and be allowed to go to AA  Medications:  Continue detox program.  Consultations:  None  Discharge Concerns:  Relapse, ability to go to AA due to financial stressors as wife is on disability.  Estimated LOS: 5-7 days  Other:  Referral to AA and outpatient program.    I certify that inpatient services furnished can reasonably be expected to improve the patient's condition.  Rodert Hinch 11/08/2012, 1:09 PM

## 2012-11-08 NOTE — Progress Notes (Signed)
Patient ID: DEANGLEO PASSAGE, male   DOB: [birth year], 57 y.o.   MRN: [id number] This is a voluntary admission of a 57 y.o. M/W/M with a Dx of Polysubstance Abuse. The patient reports he drinks 10-12 beers daily and smokes crack cocaine about twice a week if he can afford it. He would like to go to a thirty day rehab program upon discharge. He is employed as a Armed forces logistics/support/administrative officer and lives with his wife, son and grandchildren. States that drinking he wants to get clean and sober for his family. Denies any suicidal/homicidal ideation. Denies any a/v hallucinations. Denies any significant medical history except that he had MRSA four years ago when he was in prison. Fall risk plan reviewed with the patient. Denies h/o falling.

## 2012-11-08 NOTE — ED Provider Notes (Signed)
Medical screening examination/treatment/procedure(s) were performed by non-physician practitioner and as supervising physician I was immediately available for consultation/collaboration.   Gwyneth Sprout, MD 11/08/12 1005

## 2012-11-09 NOTE — Progress Notes (Signed)
[facility] MD Progress Note  11/09/2012 2:29 PM Barry Cook  MRN:  [id number] Subjective:  Patient is admitted voluntarily for alcohol dependence and cocaine abuse. He has been struggling to stop by himself due to serious withdrawal symptoms and he is worried about his family relationship with his wife and son. He was initially wants to get into residential treatment center and now wants to get into out patient or IOP treatment. He is able to tolerate protocol without additional medications. He has mild stomach upset, sweating and tremors. He stated that he is eating better now compare with last one week.   Diagnosis:  Axis I: Substance Abuse, Substance Induced Mood Disorder and Alcohol dependence and cocaine abuse  ADL's:  Intact  Sleep: Fair  Appetite:  Good  Suicidal Ideation:  denied Homicidal Ideation:  Denied AEB (as evidenced by):  Psychiatric Specialty Exam: ROS  Blood pressure 105/71, pulse 76, temperature 97.6 F (36.4 C), temperature source Oral, resp. rate 18, height 5\' 11"  (1.803 m), weight 68.493 kg (151 lb).Body mass index is 21.07 kg/(m^2).  General Appearance: Disheveled and Guarded  Patent attorney::  Fair  Speech:  Clear and Coherent  Volume:  Normal  Mood:  Anxious and Depressed  Affect:  Constricted and Depressed  Thought Process:  Goal Directed and Linear  Orientation:  Full (Time, Place, and Person)  Thought Content:  Rumination  Suicidal Thoughts:  No  Homicidal Thoughts:  No  Memory:  Immediate;   Fair  Judgement:  Intact  Insight:  Fair  Psychomotor Activity:  Normal  Concentration:  Good  Recall:  Good  Akathisia:  NA  Handed:  Right  AIMS (if indicated):     Assets:  Communication Skills Desire for Improvement Financial Resources/Insurance Housing Physical Health Resilience Social Support Transportation  Sleep:  Number of Hours: 6.75   Current Medications: Current Facility-Administered Medications  Medication Dose Route Frequency Provider  Last Rate Last Dose  . acetaminophen (TYLENOL) tablet 650 mg  650 mg Oral Q6H PRN Jorje Guild, PA-C   650 mg at 11/08/12 0843  . alum & mag hydroxide-simeth (MAALOX/MYLANTA) 200-200-20 MG/5ML suspension 30 mL  30 mL Oral Q4H PRN Jorje Guild, PA-C      . chlordiazePOXIDE (LIBRIUM) capsule 25 mg  25 mg Oral Q6H PRN Jorje Guild, PA-C   25 mg at 11/08/12 2109  . magnesium hydroxide (MILK OF MAGNESIA) suspension 30 mL  30 mL Oral Daily PRN Jorje Guild, PA-C      . nicotine (NICODERM CQ - dosed in mg/24 hours) patch 21 mg  21 mg Transdermal Daily Nehemiah Settle, MD   21 mg at 11/09/12 0758  . traZODone (DESYREL) tablet 50 mg  50 mg Oral QHS PRN,MR X 1 Larena Sox, MD   50 mg at 11/08/12 2109    Lab Results: No results found for this or any previous visit (from the past 48 hour(s)).  Physical Findings: AIMS: Facial and Oral Movements Muscles of Facial Expression: None, normal Lips and Perioral Area: None, normal Jaw: None, normal Tongue: None, normal,Extremity Movements Upper (arms, wrists, hands, fingers): None, normal Lower (legs, knees, ankles, toes): None, normal, Trunk Movements Neck, shoulders, hips: None, normal, Overall Severity Severity of abnormal movements (highest score from questions above): None, normal Incapacitation due to abnormal movements: None, normal Patient's awareness of abnormal movements (rate only patient's report): No Awareness, Dental Status Current problems with teeth and/or dentures?: No Does patient usually wear dentures?: No  CIWA:  CIWA-Ar Total: 1  COWS:  COWS Total Score: 1  Treatment Plan Summary: Daily contact with patient to assess and evaluate symptoms and progress in treatment Medication management  Plan: 1. Continue current treatment plan 2. May consider SSRI  3. Encourage milieu, substance abuse counseling and group therapy 4. Refer to out patient substance abuse program 5. Disposition plans in progress  Medical Decision Making Problem  Points:  Established problem, worsening (2) and Review of psycho-social stressors (1) Data Points:  Review or order clinical lab tests (1) Review of medication regiment & side effects (2)  I certify that inpatient services furnished can reasonably be expected to improve the patient's condition.   Adisen Bennion,JANARDHAHA R. 11/09/2012, 2:29 PM

## 2012-11-09 NOTE — Progress Notes (Signed)
D:  Patient's self inventory sheet, patient has poor sleep, good appetite, normal energy level, good attention span.  Has felt agitation from withdrawals.  Denied SI.  Denied physical problems.  Worst pain #2.  "No alcohol, drugs, cigarettes.  I know it is going to be a real challenge, but I think I can do it." A:  Medications administered per MD orders.  Emotional support and encouragement given patient. R:  Denied SI and HI.   Denied A/V hallucinations.   Denied pain.  Will continue to monitor patient every 15 minutes for safety.  Safety maintained.

## 2012-11-09 NOTE — Progress Notes (Signed)
Adult Psychoeducational Group Note  Date:  11/09/2012 Time:  6:41 PM  Group Topic/Focus:  Self Care:   The focus of this group is to help patients understand the importance of self-care in order to improve or restore emotional, physical, spiritual, interpersonal, and financial health.  Participation Level:  Minimal  Participation Quality:  Appropriate and Attentive  Affect:  Appropriate  Cognitive:  Disorganized  Insight: Improving  Engagement in Group:  Developing/Improving  Modes of Intervention:  Activity, Discussion, Education, Socialization and Support  Additional Comments:  Barry Cook attended group and shared during group. Patient defined self care in his own term. Patient was given a self care assessment and completed form and gave incite at times during group of what the weakness and strengths of physical, psychological, spiritual, emotional, relationship care, based on how the patient rated the areas of assessment. Patient concluded with making a goal on how to improve in the areas that need attention. Patient was having trouble at times, verbalizing information that he shared from assessment, and not understanding questions given to him throughout group.   [name] 11/09/2012, 6:41 PM

## 2012-11-09 NOTE — [facility] Group Notes (Signed)
[facility] LCSW Group Therapy          Overcoming Obstacles       1:15 -2:30        11/09/2012   1:22 PM     Type of Therapy:  Group Therapy  Participation Level:  Appropriate  Participation Quality:  Appropriate  Affect:  Appropriate  Cognitive:  Attentive Appropriate  Insight:  Engaged  Engagement in Therapy:  Engaged  Modes of Intervention:  Discussion Exploration Problem-Solving Supportive  Summary of Progress/Problems:  Patient advised the obstacle he needs to overcome is alcohol.  He endorses a 30 year history with five years of sobriety in the 1990's.  Patient shared he is willing to attend AA and go to outcome treatment to overcome his abuse of alcohol.  Wynn Banker 11/09/2012    1:22 PM

## 2012-11-09 NOTE — Tx Team (Signed)
Interdisciplinary Treatment Plan Update   Date Reviewed:  11/09/2012  Time Reviewed:  10:03 AM  Progress in Treatment:   Attending groups: Yes Participating in groups: Yes Taking medication as prescribed: Yes  Tolerating medication: Yes Family/Significant other contact made:  No, counselor to assess for collateral contact Patient understands diagnosis: Yes  Discussing patient identified problems/goals with staff: Yes Medical problems stabilized or resolved: Yes Denies suicidal/homicidal ideation: Yes Patient has not harmed self or others: Yes  For review of initial/current patient goals, please see plan of care.  Estimated Length of Stay:  2-4 days  Reasons for Continued Hospitalization:  Anxiety Depression Medication stabilization Suicidal ideation  New Problems/Goals identified:    Discharge Plan or Barriers:   Home with outpatient follow up  Additional Comments:  Patient to complete detox protocol and follow up with outpatient treatment.  Attendees:  Patient:  11/09/2012 10:03 AM   Signature: Mervyn Gay, MD 11/09/2012 10:03 AM  Signature: 11/09/2012 10:03 AM  Signature:  11/09/2012 10:03 AM  Signature:Beverly Terrilee Croak, RN 11/09/2012 10:03 AM  Signature:  Neill Loft RN 11/09/2012 10:03 AM  Signature:  Juline Patch, LCSW 11/09/2012 10:03 AM  Signature:   11/09/2012 10:03 AM  Signature:  Maseta Dorley,Care Coordinator 11/09/2012 10:03 AM  Signature: Fransisca Kaufmann, [facility] 11/09/2012 10:03 AM  Signature:    Signature:    Signature:      Scribe for Treatment Team:   Juline Patch,  11/09/2012 10:03 AM

## 2012-11-09 NOTE — Progress Notes (Signed)
D: Patient in his room reading on first approach.  Patient states he is feeling ok today.  Patient states he is going to stop drinking alcohol.  Patient states he has a new outlook on life.  Patient states this is his first attempt to stop drinking.  Patient states he needs to get his life together for his family because his drinking is ruining his family life.  Patient denies SI/HI and denies AVH.   A: Staff to monitor Q 15 mins for safety.  Encouragement and support offered.  Scheduled medications administered per orders.  Trazodone administered prn for sleep. R: Patient remains safe on the unit.  Patient attended group tonight.  Patient visible on the unit and interacting with peers.  Patient calm, cooperative and taking administered medications.

## 2012-11-09 NOTE — Progress Notes (Signed)
Pt is resting in bed with eyes closed. RR WNL, even and unlabored. Level III obs in place for safety and pt remains safe. Lawrence Marseilles

## 2012-11-10 DIAGNOSIS — F1994 Other psychoactive substance use, unspecified with psychoactive substance-induced mood disorder: Secondary | ICD-10-CM

## 2012-11-10 DIAGNOSIS — F1910 Other psychoactive substance abuse, uncomplicated: Secondary | ICD-10-CM

## 2012-11-10 DIAGNOSIS — F1410 Cocaine abuse, uncomplicated: Secondary | ICD-10-CM

## 2012-11-10 NOTE — Progress Notes (Signed)
D:  Patient in his room reading on approach.  Patient states he had a good day to day.  Patient states he went to groups all day.  Patient states he has been thinking about what is going to happen when he goes home.  Patient states he wants a new life that he is in control of.  Patient denies SI/HI and denies AVH. A: Staff to monitor Q 15 mins for safety.  Encouragement and support offered.  No scheduled medications administered per orders. R: Patient remains safe on the unit.  Patient attended group tonight.  Patient calm cooperative and taking administered medications.

## 2012-11-10 NOTE — Progress Notes (Signed)
Grief and Loss Group ° °Group members discussed significant losses in their life and shared their grief and loss experiences.  ° °Pt was present and attentive in group. Pt did not participate verbally.  ° °Barry Cook °Counselor Intern °[employer] °

## 2012-11-10 NOTE — Progress Notes (Signed)
Adult Psychoeducational Group Note  Date:  11/10/2012 Time:  1:21 PM  Group Topic/Focus:  Recovery Goals:   The focus of this group is to identify appropriate goals for recovery and establish a plan to achieve them.  Participation Level:  Minimal  Participation Quality:  Appropriate and Attentive  Affect:  Appropriate  Cognitive:  Alert and Appropriate  Insight: Good  Engagement in Group:  Improving  Modes of Intervention:  Activity, Discussion, Socialization and Support  Additional Comments:   Patient came to group and participated. The purpose of group was to define, educate and discuss recovery and how to make recovery goals. Patient made two realistic goals for recovery and discussed with the group how the goals were going to be achieved and how to make positive changes towards recovery.  Barry Cook 11/10/2012, 1:21 PM

## 2012-11-10 NOTE — Progress Notes (Signed)
Recreation Therapy Notes  Date: 06.17.2014 Time: 2:45pm Location: [address] Day[address]      Group Topic/Focus: Musician (AAA/T)  Participation Level: Did not attend.   Marykay Lex Tavon Magnussen, LRT/CTRS  Jearl Klinefelter 11/10/2012 4:31 PM

## 2012-11-10 NOTE — Progress Notes (Signed)
D:  Patient sleeps well, has good appetite, normal energy level, good attention span.  Denied SI.  Denied physical problems.  Plans to quit drinking.  Does have discharge plans.  No problems taking meds after discharge. A:  Medications administered per MD orders.  Emotional support and encouragement given patient. R:  Denied SI and HI.   Denied A/V hallucinations.  Denied pain.  Will continue to monitor for safety with 15 minute checks.  Safety maintained.

## 2012-11-10 NOTE — Progress Notes (Signed)
Patient ID: Barry Cook, male   DOB: 10/22/1955, 57 y.o.   MRN: [id number] [facility] MD Progress Note  11/10/2012 2:13 PM Barry Cook  MRN:  [id number] Subjective:  Patient reports that he is not having any withdrawal symptoms today. Rates his depression at seven on admission but is now zero. When asked what has changed patient states "I got insight into my problems by coming here. I need to start attending AA, get back in church, and get a sponsor." Patient talked about his behavior prior to admission "I would drink all weekend and then start using cocaine. I was out driving drunk all the time." The patient appeared to be minimizing his symptoms and not being fully aware of the consequences of his actions such as harming himself or someone else indirectly. Patient also reports that his cocaine use had gradually gotten worse over the last year. He does not want now to go to a residential treatment program because "I'm the only one working. I can't go away to just take care of myself."  Diagnosis:  Axis I: Substance Abuse, Substance Induced Mood Disorder and Alcohol dependence and cocaine abuse  ADL's:  Intact  Sleep: Fair  Appetite:  Good  Suicidal Ideation:  denied Homicidal Ideation:  Denied AEB (as evidenced by):  Psychiatric Specialty Exam: Review of Systems  Constitutional: Negative.   HENT: Negative.   Eyes: Negative.   Respiratory: Negative.   Cardiovascular: Negative.   Gastrointestinal: Negative.   Genitourinary: Negative.   Musculoskeletal: Negative.   Skin: Negative.   Neurological: Negative.   Endo/Heme/Allergies: Negative.   Psychiatric/Behavioral: Positive for depression and substance abuse. Negative for suicidal ideas, hallucinations and memory loss. The patient has insomnia. The patient is not nervous/anxious.     Blood pressure 132/86, pulse 62, temperature 98.3 F (36.8 C), temperature source Oral, resp. rate 20, height 5\' 11"  (1.803 m), weight 68.493 kg  (151 lb).Body mass index is 21.07 kg/(m^2).  General Appearance: Disheveled and Guarded  Patent attorney::  Fair  Speech:  Clear and Coherent  Volume:  Normal  Mood:  Anxious and Depressed  Affect:  Constricted and Depressed  Thought Process:  Goal Directed and Linear  Orientation:  Full (Time, Place, and Person)  Thought Content:  Rumination  Suicidal Thoughts:  No  Homicidal Thoughts:  No  Memory:  Immediate;   Fair  Judgement:  Intact  Insight:  Fair  Psychomotor Activity:  Normal  Concentration:  Good  Recall:  Good  Akathisia:  NA  Handed:  Right  AIMS (if indicated):     Assets:  Communication Skills Desire for Improvement Financial Resources/Insurance Housing Physical Health Resilience Social Support Transportation  Sleep:  Number of Hours: 6.5   Current Medications: Current Facility-Administered Medications  Medication Dose Route Frequency Provider Last Rate Last Dose  . acetaminophen (TYLENOL) tablet 650 mg  650 mg Oral Q6H PRN Jorje Guild, PA-C   650 mg at 11/08/12 0843  . alum & mag hydroxide-simeth (MAALOX/MYLANTA) 200-200-20 MG/5ML suspension 30 mL  30 mL Oral Q4H PRN Jorje Guild, PA-C      . chlordiazePOXIDE (LIBRIUM) capsule 25 mg  25 mg Oral Q6H PRN Jorje Guild, PA-C   25 mg at 11/08/12 2109  . magnesium hydroxide (MILK OF MAGNESIA) suspension 30 mL  30 mL Oral Daily PRN Jorje Guild, PA-C      . nicotine (NICODERM CQ - dosed in mg/24 hours) patch 21 mg  21 mg Transdermal Daily Nehemiah Settle, MD  21 mg at 11/10/12 0651  . traZODone (DESYREL) tablet 50 mg  50 mg Oral QHS PRN,MR X 1 Larena Sox, MD   50 mg at 11/09/12 2126    Lab Results: No results found for this or any previous visit (from the past 48 hour(s)).  Physical Findings: AIMS: Facial and Oral Movements Muscles of Facial Expression: None, normal Lips and Perioral Area: None, normal Jaw: None, normal Tongue: None, normal,Extremity Movements Upper (arms, wrists, hands, fingers): None,  normal Lower (legs, knees, ankles, toes): None, normal, Trunk Movements Neck, shoulders, hips: None, normal, Overall Severity Severity of abnormal movements (highest score from questions above): None, normal Incapacitation due to abnormal movements: None, normal Patient's awareness of abnormal movements (rate only patient's report): No Awareness, Dental Status Current problems with teeth and/or dentures?: No Does patient usually wear dentures?: No  CIWA:  CIWA-Ar Total: 1 COWS:  COWS Total Score: 0  Treatment Plan Summary: Daily contact with patient to assess and evaluate symptoms and progress in treatment Medication management  Plan: Continue crisis management and stabilization.  Medication management: Patient reports that Trazodone 50 mg is effective for sleep. Patient may request librium every six hours as needed for withdrawal symptoms.  Encouraged patient to attend groups and participate in group counseling sessions and activities.  Discharge plan in progress.  Address health issues: Vitals reviewed and stable.  Continue current treatment plan.   Medical Decision Making Problem Points:  Established problem, worsening (2) and Review of psycho-social stressors (1) Data Points:  Review or order clinical lab tests (1) Review of medication regiment & side effects (2)  I certify that inpatient services furnished can reasonably be expected to improve the patient's condition.   Gregg Winchell NP-C 11/10/2012, 2:13 PM

## 2012-11-10 NOTE — [facility] Group Notes (Signed)
[facility] LCSW Group Therapy      Feelings About Diagnosis 1:15 - 2:30 PM         11/10/2012  3:44 PM    Type of Therapy:  Group Therapy  Participation Level:  Minimal  Participation Quality:  Appropriate  Affect:  Appropriate  Cognitive:  Alert and Appropriate  Insight:  Developing/Improving and Engaged  Engagement in Therapy:  Developing/Improving and Engaged  Modes of Intervention:  Discussion, Education, Exploration, Problem-Solving, Rapport Building, Support  Summary of Progress/Problems:  Patient actively participated in group.  He listened attentively and nodded in agreement but did not engage in the discussion.  Wynn Banker 11/10/2012  3:44 PM

## 2012-11-11 DIAGNOSIS — F1010 Alcohol abuse, uncomplicated: Secondary | ICD-10-CM

## 2012-11-11 DIAGNOSIS — F1910 Other psychoactive substance abuse, uncomplicated: Secondary | ICD-10-CM

## 2012-11-11 DIAGNOSIS — F1994 Other psychoactive substance use, unspecified with psychoactive substance-induced mood disorder: Secondary | ICD-10-CM

## 2012-11-11 MED ORDER — TRAZODONE HCL 50 MG PO TABS: 50.0000 mg | ORAL_TABLET | Freq: Every evening | ORAL | Status: DC | PRN

## 2012-11-11 NOTE — Progress Notes (Signed)
D: Pt denies SI,HI, & AVH.A: Pt was given samples, script, & all d/c instructions. R: Pt verbalized understanding of all d/c instructions. Supported & encouraged. Pt. D/C,d home with family.

## 2012-11-11 NOTE — Progress Notes (Signed)
D:Pt is preparing for discharge. Pt reports that his sister will be picking him up from the hospital. He is requesting nicotine patch with desire to stop smoking. A:Contacted pharmacy with nicotine patch request. Offered support and 15 minute checks. R:Pt denies si and hi. Safety maintained on the unit.

## 2012-11-11 NOTE — Progress Notes (Signed)
Recreation Therapy Notes  Date: 06.18.2014 Time: 3:00pm Location: [address] Day[address]      Group Topic/Focus: Communication, Journalist, newspaper, Team Work  Participation Level: Active  Participation Quality: Appropriate  Affect: Euhtymic  Cognitive: Appropriate   Additional Comments: Activity: Landing Pad ; Explanation: Patients in groups of 4-5 were given 10 drinking straws and a length of medical tape. Using the straws and the medical tape patients were asked create a landing pad that would catch a golf ball dropped from approximately 6 feet.    Patient actively participated in group activity. Patient assisted peers with building team landing pad. Patient team successful at building a landing pad that captured golf ball. Patient contributed to wrap up discussion about the importance of using good communication skills, problem solving skills, and team work to Engineer, petroleum. Patient stated the skills used in group were easy to use because the activity was a group activity. Patient stated he does not have a team to help him at home. LRT encouraged patient to identify people he could add to his team. LRT related the concept of a team to his support system. Patient verbalized understanding of how the two are related, as well as how he can use communication and problem solving skills to build his support sytem.   Marykay Lex Versie Fleener, LRT/CTRS  Jearl Klinefelter 11/11/2012 5:23 PM

## 2012-11-11 NOTE — Discharge Summary (Signed)
Physician Discharge Summary Note  Patient:  Barry Cook is an 57 y.o., male MRN:  [id number] DOB:  11/10/1955 Patient phone:  [phone] (home)  Patient address:   [address] [facility] [place] [address],   Date of Admission:  11/07/2012 Date of Discharge: 11/11/12  Reason for Admission:  Alcohol abuse  Discharge Diagnoses: Active Problems:   * No active hospital problems. *  Review of Systems  Constitutional: Negative.   HENT: Negative.   Eyes: Negative.   Respiratory: Negative.   Cardiovascular: Negative.   Gastrointestinal: Negative.   Genitourinary: Negative.   Musculoskeletal: Negative.   Skin: Negative.   Neurological: Negative.   Endo/Heme/Allergies: Negative.   Psychiatric/Behavioral: Positive for substance abuse. Negative for depression, suicidal ideas, hallucinations and memory loss. The patient is nervous/anxious. The patient does not have insomnia.    Axis Diagnosis:   AXIS I:  Alcohol Abuse, Substance Abuse and Substance Induced Mood Disorder AXIS II:  Deferred AXIS III:  History reviewed. No pertinent past medical history. AXIS IV:  economic problems, housing problems, occupational problems, other psychosocial or environmental problems and problems related to social environment AXIS V:  61-70 mild symptoms  Level of Care:  OP  Hospital Course:  Barry Cook is an 57 y.o. male. Pt consumes approximately 150 to 200 oz of beer daily. Pt works during the day and then drinks when gets home. Pt family encouraging pt to get help. Pt says "Its time to get help." Pt denies HI, SI, AVH and reports no prior MH hx. Pt also reports no prior SA tx.       The duration of stay was four days. The patient was seen and evaluated by the Treatment team consisting of Psychiatrist, NP-C, RN, Case Manager, and Therapist for evaluation and treatment plan with goal of stabilization upon discharge. The patient's physical and mental health problems were identified and  treated appropriately.      Multiple modalities of treatment were used including medication, individual and group therapies, unit programming, improved nutrition, physical activity, and family sessions as needed. On admission his alcohol level was less than eleven and his urine drug screen was positive for cocaine. He was not taking any prescribed medications prior to entering the hospital. The patient was ordered librium 25 mg every six hours as needed for symptoms of withdrawal. He was started on Trazodone 50 mg at hs prn to help improve his quality of sleep.      The symptoms of alcohol withdrawal were monitored daily by evaluation by clinical provider.  The patient's mental and emotional status was evaluated by a daily self inventory completed by the patient. Improvement was demonstrated by declining numbers on the self assessment, improving vital signs, increased cognition, and improvement in mood, sleep, appetite as well as a reduction in physical symptoms. Patient talked about how his drinking had gotten out of control for some time and impaired his judgment, which would lead to use of cocaine. He admitted to driving drunk but denied any legal charges. Patient developed insight during his admission about the potential consequences that could result medically and legally.      The patient was evaluated and found to be stable enough for discharge and was released to home per the initial plan of treatment. [facility] for [facility] treatment of his substance abuse. The patient is motivated to get help for himself and his family. Patient was given a prescription for Trazodone and sample  supply of medications.   Mental Status Exam:  For mental status exam please see mental status exam and  suicide risk assessment completed by attending physician prior to discharge.  Consults:  None  Significant Diagnostic Studies:  labs: Chem profile, CBC, UDS positive for cocaine  Discharge  Vitals:   Blood pressure 110/81, pulse 80, temperature 97.2 F (36.2 C), temperature source Oral, resp. rate 20, height 5\' 11"  (1.803 m), weight 68.493 kg (151 lb). Body mass index is 21.07 kg/(m^2). Lab Results:   No results found for this or any previous visit (from the past 72 hour(s)).  Physical Findings: AIMS: Facial and Oral Movements Muscles of Facial Expression: None, normal Lips and Perioral Area: None, normal Jaw: None, normal Tongue: None, normal,Extremity Movements Upper (arms, wrists, hands, fingers): None, normal Lower (legs, knees, ankles, toes): None, normal, Trunk Movements Neck, shoulders, hips: None, normal, Overall Severity Severity of abnormal movements (highest score from questions above): None, normal Incapacitation due to abnormal movements: None, normal Patient's awareness of abnormal movements (rate only patient's report): No Awareness, Dental Status Current problems with teeth and/or dentures?: No Does patient usually wear dentures?: No  CIWA:  CIWA-Ar Total: 1 COWS:  COWS Total Score: 0  Psychiatric Specialty Exam: See Psychiatric Specialty Exam and Suicide Risk Assessment completed by Attending Physician prior to discharge.  Discharge destination:  Home  Is patient on multiple antipsychotic therapies at discharge:  No   Has Patient had three or more failed trials of antipsychotic monotherapy by history:  No  Recommended Plan for Multiple Antipsychotic Therapies: N/A     Medication List    TAKE these medications     Indication   traZODone 50 MG tablet  Commonly known as:  DESYREL  Take 1 tablet (50 mg total) by mouth at bedtime as needed and may repeat dose one time if needed for sleep.   Indication:  Excessive Use of Alcohol, Trouble Sleeping           Follow-up Information   [facility] with [facility]. (Please go to [facility]'s walk in clinic Friday, November 13, 2012 or any weekda y 8AM-12PM for substance abuse treatment.)     Contact information:   [address] [place], [place]   [address]  [phone]      Follow-up recommendations:  Activity:  As tolerated Diet:  Regular  Comments:   Take all your medications as prescribed by your mental healthcare provider.  Report any adverse effects and or reactions from your medicines to your outpatient provider promptly.  Patient is instructed and cautioned to not engage in alcohol and or illegal drug use while on prescription medicines.  In the event of worsening symptoms, patient is instructed to call the crisis hotline, 911 and or go to the nearest ED for appropriate evaluation and treatment of symptoms.  Follow-up with your primary care provider for your other medical issues, concerns and or health care needs.   Total Discharge Time:  Greater than 30 minutes.  SignedFransisca Kaufmann NP-C 11/11/2012, 10:47 AM  Patient was personally met and examined and made appropriate disposition treatment plans. Case discussed with her physician extender. Reviewed the information documented and agree with the treatment plan.  Carlei Huang,JANARDHAHA R. 11/11/2012 9:05 PM

## 2012-11-11 NOTE — [facility] Suicide Risk Assessment (Signed)
Suicide Risk Assessment  Discharge Assessment     Demographic Factors:  Male, Adolescent or young adult, Caucasian, Low socioeconomic status and Unemployed  Mental Status Per Nursing Assessment::   On Admission:  NA  Current Mental Status by Physician: Mental Status Examination: Patient appeared as per his stated age, casually dressed, and fairly groomed, and maintaining good eye contact. Patient has good mood and his affect was constricted. He has normal rate, rhythm, and volume of speech. His thought process is linear and goal directed. Patient has denied suicidal, homicidal ideations, intentions or plans. Patient has no evidence of auditory or visual hallucinations, delusions, and paranoia. Patient has fair insight judgment and impulse control.  Loss Factors: Financial problems/change in socioeconomic status  Historical Factors: NA  Risk Reduction Factors:   Sense of responsibility to family, Religious beliefs about death, Living with another person, especially a relative, Positive social support and Positive coping skills or problem solving skills  Continued Clinical Symptoms:  Depression:   Comorbid alcohol abuse/dependence Hopelessness Alcohol/Substance Abuse/Dependencies Previous Psychiatric Diagnoses and Treatments Medical Diagnoses and Treatments/Surgeries  Cognitive Features That Contribute To Risk:  Polarized thinking    Suicide Risk:  Minimal: No identifiable suicidal ideation.  Patients presenting with no risk factors but with morbid ruminations; may be classified as minimal risk based on the severity of the depressive symptoms  Discharge Diagnoses:   AXIS I:  Substance Abuse, Substance Induced Mood Disorder and Alcohol dependence AXIS II:  Deferred AXIS III:  History reviewed. No pertinent past medical history. AXIS IV:  economic problems, housing problems, occupational problems, other psychosocial or environmental problems and problems related to social  environment AXIS V:  51-60 moderate symptoms  Plan Of Care/Follow-up recommendations:  Activity:  as tolerated Diet:  regular  Is patient on multiple antipsychotic therapies at discharge:  No   Has Patient had three or more failed trials of antipsychotic monotherapy by history:  No  Recommended Plan for Multiple Antipsychotic Therapies: Not applicable  Rhenda [place],JANARDHAHA R. 11/11/2012, 2:01 PM

## 2012-11-11 NOTE — Progress Notes (Signed)
Adult Psychoeducational Group Note  Date:  11/11/2012 Time:  12:35 PM  Group Topic/Focus:  Personal Choices and Values:   The focus of this group is to help patients assess and explore the importance of values in their lives, how their values affect their decisions, how they express their values and what opposes their expression.  Participation Level:  Active  Participation Quality:  Appropriate and Attentive  Affect:  Appropriate  Cognitive:  Alert and Appropriate  Insight: Good  Engagement in Group:  Engaged  Modes of Intervention:  Activity, Discussion, Socialization and Support  Additional Comments:  Patient participated well in group. The purpose of this group was for the patient to identify what four values they found most important to them and how to incorporate them into their lives. The patient identified a change or a choice for how they can support that particular value in their life. The patient shared a value with the group and an example of a choice to take responsibility for that value. There was an emphasis on choices and how choices impact and support values.   Cathlean Cower 11/11/2012, 12:35 PM

## 2012-11-16 NOTE — Progress Notes (Signed)
Patient Discharge Instructions:  After Visit Summary (AVS):   Faxed to:  11/16/12 Discharge Summary Note:   Faxed to:  11/16/12 Psychiatric Admission Assessment Note:   Faxed to:  11/16/12 Suicide Risk Assessment - Discharge Assessment:   Faxed to:  11/16/12 Faxed/Sent to the Next Level Care provider:  11/16/12 Faxed to [facility] @ [phone]  Jerelene Redden, 11/16/2012, 4:33 PM

## 2013-01-01 NOTE — Consult Note (Signed)
Agree with the plan. Case discussed

## 2015-03-07 ENCOUNTER — Emergency Department (HOSPITAL_COMMUNITY): Payer: Self-pay

## 2015-03-07 ENCOUNTER — Emergency Department (HOSPITAL_COMMUNITY)
Admission: EM | Admit: 2015-03-07 | Discharge: 2015-03-08 | Disposition: A | Discharge status: Home / Self Care | Payer: Self-pay | Attending: Emergency Medicine | Admitting: Emergency Medicine

## 2015-03-07 ENCOUNTER — Encounter (HOSPITAL_COMMUNITY): Payer: Self-pay | Admitting: Emergency Medicine

## 2015-03-07 DIAGNOSIS — Y998 Other external cause status: Secondary | ICD-10-CM | POA: Insufficient documentation

## 2015-03-07 DIAGNOSIS — R221 Localized swelling, mass and lump, neck: Secondary | ICD-10-CM | POA: Insufficient documentation

## 2015-03-07 DIAGNOSIS — Y9289 Other specified places as the place of occurrence of the external cause: Secondary | ICD-10-CM | POA: Insufficient documentation

## 2015-03-07 DIAGNOSIS — S4991XA Unspecified injury of right shoulder and upper arm, initial encounter: Secondary | ICD-10-CM | POA: Insufficient documentation

## 2015-03-07 DIAGNOSIS — Z720 Tobacco use: Secondary | ICD-10-CM | POA: Insufficient documentation

## 2015-03-07 DIAGNOSIS — S12590A Other displaced fracture of sixth cervical vertebra, initial encounter for closed fracture: Secondary | ICD-10-CM | POA: Insufficient documentation

## 2015-03-07 DIAGNOSIS — X58XXXA Exposure to other specified factors, initial encounter: Secondary | ICD-10-CM | POA: Insufficient documentation

## 2015-03-07 DIAGNOSIS — IMO0002 Reserved for concepts with insufficient information to code with codable children: Secondary | ICD-10-CM

## 2015-03-07 DIAGNOSIS — J984 Other disorders of lung: Secondary | ICD-10-CM

## 2015-03-07 DIAGNOSIS — R229 Localized swelling, mass and lump, unspecified: Secondary | ICD-10-CM

## 2015-03-07 DIAGNOSIS — Y9389 Activity, other specified: Secondary | ICD-10-CM | POA: Insufficient documentation

## 2015-03-07 LAB — I-STAT CHEM 8, ED
BUN: 4 mg/dL — AB (ref 6–20)
CALCIUM ION: 1.14 mmol/L (ref 1.13–1.30)
CREATININE: 0.7 mg/dL (ref 0.61–1.24)
Chloride: 102 mmol/L (ref 101–111)
GLUCOSE: 96 mg/dL (ref 65–99)
HEMATOCRIT: 41 % (ref 39.0–52.0)
HEMOGLOBIN: 13.9 g/dL (ref 13.0–17.0)
Potassium: 3.9 mmol/L (ref 3.5–5.1)
Sodium: 138 mmol/L (ref 135–145)
TCO2: 23 mmol/L (ref 0–100)

## 2015-03-07 MED ORDER — HYDROCODONE-ACETAMINOPHEN 5-325 MG PO TABS
1.0000 | ORAL_TABLET | Freq: Once | ORAL | Status: AC
  Administered 2015-03-07: 1 via ORAL
  Filled 2015-03-07: qty 1

## 2015-03-07 MED ORDER — GADOBENATE DIMEGLUMINE 529 MG/ML IV SOLN
14.0000 mL | Freq: Once | INTRAVENOUS | Status: AC | PRN
  Administered 2015-03-07: 14 mL via INTRAVENOUS

## 2015-03-07 NOTE — ED Notes (Signed)
Pt from home for eval of neck pain and back pain, pt denies any injury. States was getting relief from [facility] powder but now they are not helping anymore. Pt ambulatory in triage, alert and oriented./

## 2015-03-07 NOTE — ED Provider Notes (Signed)
CSN: [id number]     Arrival date & time 03/07/15  1435 History  By signing my name below, I, Stephania Fragmin, attest that this documentation has been prepared under the direction and in the presence of Debroah Baller, NP. Electronically Signed: Stephania Fragmin, ED Scribe. 03/07/2015. 3:22 PM.    Chief Complaint  Patient presents with  . Back Pain   The history is provided by the patient. No language interpreter was used.    HPI Comments: Barry Cook is a 59 y.o. male who presents to the Emergency Department complaining of constant, worsening, aching, occasionally burning neck pain radiating to his right shoulder that began 1 month ago. Patient had tried [employer] BC Powder which helped dull his pain initially, but it has not alleviated his pain in the past week. He denies a history of any chronic medical conditions. Patient states he does not have a PCP. Patient denies otalgia, sore throat, or eye pain. Patient is a PPD smoker for many years.  History reviewed. No pertinent past medical history. History reviewed. No pertinent past surgical history. No family history on file. Social History  Substance Use Topics  . Smoking status: Current Every Day Smoker -- 1.00 packs/day    Types: Cigarettes  . Smokeless tobacco: None  . Alcohol Use: 7.2 oz/week    12 Cans of beer per week    Review of Systems  HENT: Negative for ear pain and sore throat.   Eyes: Negative for pain.  Musculoskeletal: Positive for arthralgias (right shoulder) and neck pain.  All other systems reviewed and are negative.  Allergies  Review of patient's allergies indicates no known allergies.  Home Medications   Prior to Admission medications   Medication Sig Start Date End Date Taking? Authorizing Provider  oxyCODONE-acetaminophen (ROXICET) 5-325 MG tablet Take 1 tablet by mouth every 4 (four) hours as needed for severe pain. 03/08/15   Kennesha Brewbaker Bunnie Pion, NP  traZODone (DESYREL) 50 MG tablet Take 1 tablet (50 mg total)  by mouth at bedtime as needed and may repeat dose one time if needed for sleep. 11/11/12   Niel Hummer, NP   BP 114/80 mmHg  Pulse 63  Temp(Src) 97.8 F (36.6 C) (Oral)  Resp 16  SpO2 98% Physical Exam  Constitutional: He is oriented to person, place, and time. He appears well-developed and well-nourished. No distress.  HENT:  Head: Normocephalic and atraumatic.  Eyes: EOM are normal. Pupils are equal, round, and reactive to light.  Neck: Trachea normal. Neck supple. Spinous process tenderness and muscular tenderness present.  Cardiovascular: Normal rate and regular rhythm.   Pulmonary/Chest: Effort normal. No respiratory distress. He has decreased breath sounds in the right lower field. He has wheezes. He has rhonchi.  Abdominal: Soft. Bowel sounds are normal. There is no tenderness.  Musculoskeletal: Normal range of motion. He exhibits no edema.       Cervical back: He exhibits tenderness and spasm (on the right). He exhibits normal range of motion, no swelling, no laceration and normal pulse.  Neurological: He is alert and oriented to person, place, and time. He has normal strength. No cranial nerve deficit or sensory deficit. Coordination and gait normal.  Reflex Scores:      Bicep reflexes are 2+ on the right side and 2+ on the left side.      Brachioradialis reflexes are 2+ on the right side and 2+ on the left side.      Patellar reflexes are 2+ on the  right side and 2+ on the left side.      Achilles reflexes are 2+ on the right side and 2+ on the left side. Skin: Skin is warm and dry.  Psychiatric: He has a normal mood and affect. His behavior is normal.  Nursing note and vitals reviewed.   ED Course  Procedures (including critical care time)  DIAGNOSTIC STUDIES: Oxygen Saturation is 98% on RA, normal by my interpretation.    COORDINATION OF CARE: 3:19 PM - Discussed treatment plan with pt at bedside which includes CT cervical spine. If negative, will treat with muscle  relaxants and anti-inflammatories. Pt verbalized understanding and agreed to plan.   Imaging Review Dg Chest 2 View  03/07/2015   CLINICAL DATA:  59 year old male with chest pain for 1 week. Destructive mass of the C6 vertebral body. Initial encounter.  EXAM: CHEST  2 VIEW  COMPARISON:  Neck CT from today reported separately.  FINDINGS: There is a cavitary appearing mass like opacity in the anterior or medial segment of the right lower lobe measuring about 8 cm diameter. There is thickening of the right peritracheal density suggesting mediastinal lymphadenopathy. There is no associated pleural effusion or pneumothorax. Large lung volumes. Mild reticulonodular density in the apices as seen on the earlier CT. Chronic left ninth rib fracture. Degenerative changes in the spine with no destructive osseous lesion identified in the thorax radiographically.  IMPRESSION: Large cavitary mass like opacity in the right lower lobe. Evidence of right peritracheal lymphadenopathy.  In conjunction with the earlier neck CT findings today, favor metastatic pulmonary squamous cell carcinoma.   Electronically Signed   By: Genevie Ann M.D.   On: 03/07/2015 19:16   Ct Cervical Spine Wo Contrast  03/07/2015   CLINICAL DATA:  59 year old male with worsening chronic cervical spine pain radiating into right arm and shoulder.  EXAM: CT CERVICAL SPINE WITHOUT CONTRAST  TECHNIQUE: Multidetector CT imaging of the cervical spine was performed without intravenous contrast. Multiplanar CT image reconstructions were also generated.  COMPARISON:  07/27/2003 radiographs  FINDINGS: A lytic mass involving the right C6 vertebral body/pedicle/articular mass is identified with nondisplaced fracture involving the C6 right articular process. This is suspicious for malignancy/metastasis.  There is no evidence of acute subluxation or prevertebral soft tissue swelling.  Moderate degenerative disc disease and spondylosis from C4-C7 identified contributing  to mild to moderate central spinal and bony foraminal narrowing at several levels.  No other bony abnormalities are identified.  Mild centrilobular and paraseptal emphysema in the lung apices noted.  IMPRESSION: Destructive mass involving the right C6 vertebral body/pedicle/articular mass with nondisplaced fracture involving the C6 right articular process. This is suspicious for malignancy/metastasis. MRI with and without contrast is recommended for further evaluation.  Moderate degenerative changes from C4-C7 contributing to mild to moderate central spinal and bony foraminal narrowing.   Electronically Signed   By: Margarette Canada M.D.   On: 03/07/2015 17:47   I have personally reviewed and evaluated these images and lab results as part of my medical decision-making.  6:28 PM - Discussed pt's case with oncologist, who recommends consult with neurosurgeon to clear pt for discharge tonight due to the C-6 fracture. Will obtain MRI of cervical spine, CXR to r/o lesions in the lungs. If there are lesions in the lungs, will refer pt to Dr. Earlie Server for oncological care.   6:43 PM - Discussed treatment plan with pt, who verbalized understanding and agreed to plan.  9:30 PM - Spoke with neurosurgeon Dr.  Pool, who stated that pt may be placed in a soft cervical collar and f/u with both him and oncology.    MDM  59 y.o. male with pain to the neck and right upper back and shoulder that radiates to the right arm. Discussed with the patient findings of C-6 fracture and mass of the neck and right lung. Discussed need for follow up as soon as possible with the oncologist and neurosurgeon. Patient voices understanding and agrees with plan. Stable for d/c without focal neuro deficits.   Final diagnoses:  Cavitating mass in right lower lung lobe  Mass of neck  Fracture of C6 vertebra, closed, initial encounter    I personally performed the services described in this documentation, which was scribed in my presence. The  recorded information has been reviewed and is accurate.    [place], [place] 03/08/15 2221  Dorie Rank, MD 03/09/15 2207

## 2015-03-07 NOTE — ED Notes (Signed)
C/o neck pain to right shoulder. States has had neck pain for "a long time". Usually able to manage it with BC powders. The past 1 month, pain worse. No known injury.

## 2015-03-07 NOTE — ED Notes (Signed)
Xray to transport pt to xray and pt not in room, scribe at nurses station states pt left to go to his car. Xray informed they would be called if pt returned.

## 2015-03-08 ENCOUNTER — Telehealth: Payer: Self-pay | Admitting: *Deleted

## 2015-03-08 DIAGNOSIS — R918 Other nonspecific abnormal finding of lung field: Secondary | ICD-10-CM | POA: Insufficient documentation

## 2015-03-08 MED ORDER — OXYCODONE-ACETAMINOPHEN 5-325 MG PO TABS
ORAL_TABLET | ORAL | Status: AC
  Filled 2015-03-08: qty 1

## 2015-03-08 MED ORDER — OXYCODONE-ACETAMINOPHEN 5-325 MG PO TABS: 1.0000 | ORAL_TABLET | ORAL | Status: DC | PRN

## 2015-03-08 NOTE — Telephone Encounter (Signed)
Oncology Nurse Navigator Documentation  Oncology Nurse Navigator Flowsheets 03/08/2015  Referral date to RadOnc/MedOnc 03/08/2015  Navigator Encounter Type Telephone/recieved referral on Barry Cook today.  I called him and scheduled him to be seen at clinic on 03/16/15 arrive at 1:30.  Patient verbalized understanding of appt time and place.   Patient Visit Type Initial  Treatment Phase Abnormal Scans  Interventions Coordination of Care  Coordination of Care MD Appointments  Time Spent with Patient 15

## 2015-03-08 NOTE — Discharge Instructions (Signed)
Call tomorrow and make an appointment with Dr. Marchelle Folks office for the neck fracture.   Call tomorrow to the [facility] and tell them you were in the ED and Dx with a mass in you lung and in your neck and the doctor we spoke with wanted you to follow up with Dr.Mohammad.

## 2015-03-15 ENCOUNTER — Telehealth: Payer: Self-pay | Admitting: *Deleted

## 2015-03-15 NOTE — Telephone Encounter (Signed)
Called pt and spoke w/ his wife [name] and confirmed 03/16/15 clinic appt w/ her.

## 2015-03-16 ENCOUNTER — Encounter: Payer: Self-pay | Admitting: Internal Medicine

## 2015-03-16 ENCOUNTER — Ambulatory Visit (HOSPITAL_BASED_OUTPATIENT_CLINIC_OR_DEPARTMENT_OTHER): Payer: Medicaid Other | Admitting: Internal Medicine

## 2015-03-16 ENCOUNTER — Ambulatory Visit (HOSPITAL_COMMUNITY)
Admission: RE | Admit: 2015-03-16 | Discharge: 2015-03-16 | Disposition: A | Discharge status: Home / Self Care | Payer: Self-pay | Source: Ambulatory Visit | Attending: Internal Medicine | Admitting: Internal Medicine

## 2015-03-16 ENCOUNTER — Other Ambulatory Visit: Payer: Self-pay | Admitting: Medical Oncology

## 2015-03-16 ENCOUNTER — Encounter: Payer: Self-pay | Admitting: *Deleted

## 2015-03-16 ENCOUNTER — Ambulatory Visit: Payer: Self-pay | Admitting: Physical Therapy

## 2015-03-16 ENCOUNTER — Telehealth: Payer: Self-pay | Admitting: Internal Medicine

## 2015-03-16 ENCOUNTER — Encounter (HOSPITAL_COMMUNITY): Payer: Self-pay

## 2015-03-16 ENCOUNTER — Ambulatory Visit
Admission: RE | Admit: 2015-03-16 | Discharge: 2015-03-16 | Disposition: A | Discharge status: Home / Self Care | Payer: Self-pay | Source: Ambulatory Visit | Attending: Radiation Oncology | Admitting: Radiation Oncology

## 2015-03-16 VITALS — BP 111/67 | HR 87 | Temp 98.1°F | Resp 18 | Ht 72.0 in | Wt 156.1 lb

## 2015-03-16 DIAGNOSIS — C801 Malignant (primary) neoplasm, unspecified: Secondary | ICD-10-CM | POA: Insufficient documentation

## 2015-03-16 DIAGNOSIS — M899 Disorder of bone, unspecified: Secondary | ICD-10-CM | POA: Diagnosis not present

## 2015-03-16 DIAGNOSIS — C7972 Secondary malignant neoplasm of left adrenal gland: Secondary | ICD-10-CM | POA: Insufficient documentation

## 2015-03-16 DIAGNOSIS — R590 Localized enlarged lymph nodes: Secondary | ICD-10-CM | POA: Insufficient documentation

## 2015-03-16 DIAGNOSIS — Z720 Tobacco use: Secondary | ICD-10-CM | POA: Diagnosis not present

## 2015-03-16 DIAGNOSIS — M25511 Pain in right shoulder: Secondary | ICD-10-CM | POA: Diagnosis not present

## 2015-03-16 DIAGNOSIS — J439 Emphysema, unspecified: Secondary | ICD-10-CM | POA: Insufficient documentation

## 2015-03-16 DIAGNOSIS — C786 Secondary malignant neoplasm of retroperitoneum and peritoneum: Secondary | ICD-10-CM | POA: Insufficient documentation

## 2015-03-16 DIAGNOSIS — C7971 Secondary malignant neoplasm of right adrenal gland: Secondary | ICD-10-CM | POA: Insufficient documentation

## 2015-03-16 DIAGNOSIS — R918 Other nonspecific abnormal finding of lung field: Secondary | ICD-10-CM | POA: Diagnosis not present

## 2015-03-16 DIAGNOSIS — M5134 Other intervertebral disc degeneration, thoracic region: Secondary | ICD-10-CM | POA: Insufficient documentation

## 2015-03-16 DIAGNOSIS — D7389 Other diseases of spleen: Secondary | ICD-10-CM | POA: Insufficient documentation

## 2015-03-16 MED ORDER — IOHEXOL 300 MG/ML  SOLN
75.0000 mL | Freq: Once | INTRAMUSCULAR | Status: AC | PRN
  Administered 2015-03-16: 75 mL via INTRAVENOUS

## 2015-03-16 MED ORDER — TRAMADOL HCL 50 MG PO TABS: 50.0000 mg | ORAL_TABLET | Freq: Four times a day (QID) | ORAL | Status: DC | PRN

## 2015-03-16 NOTE — Progress Notes (Signed)
[place] [phone]   [phone] Multidisciplinary thoracic oncology clinic  CONSULT NOTE  REFERRING PHYSICIAN: Debroah Baller, NP  REASON FOR CONSULTATION:  59 years old white male with questionable lung cancer.  HPI Barry Cook is a 59 y.o. male with no significant past medical history and the patient has not seen physician for more than 20 years but he has a long history of smoking. Has been complaining of pain on the right shoulder, right neck area and right arm for 6 weeks with increasing numbness. Has been trying over-the-counter medication with no improvement in his condition. The patient presented to the emergency department at [facility] for evaluation. He had CT scan of the cervical spine performed on 03/07/2015 and it showed destructive mass involving the right C6 vertebral body/pedicle/articular mass with non-displacement fracture involving the C6 the right articular process. This is suspicious for malignant/metastasis. MRI of the cervical spine performed on the next day showed abnormal enhancement within the right C6 vertebral body extending to the facet corresponding to the destructive bony changes seen on the CT scan. Chest x-ray performed on 03/07/2015 showed large cavitary mass like opacity in the right lower lobe with evidence of right peritracheal lymphadenopathy. The patient was referred to me today for evaluation and recommendation regarding these abnormalities. When seen today he continues to complain of significant pain in the right shoulder, right arm and neck area. He also has numbness in both arms that has been going on for several years. He has occasional right-sided chest pain but no significant shortness of breath. He has cough with occasional hemoptysis. The patient also has a weight loss of around 15 pounds over the last few months. He denied having any headache or visual changes. He has occasional nausea. For pain  management he is currently on Percocet with no improvement in his condition.  HPI  PAST MEDICAL HISTORY: Unremarkable for any significant abnormality except for MRSA skin infection years ago as well as resection of sebaceous cyst . He denied having any history of hypertension, diabetes mellitus, coronary artery disease, stroke or dyslipidemia.   FAMILY HISTORY: Mother had COPD, father had heart attack and brother with throat cancer.   SOCIAL HISTORY: The patient is married and has 4 children. He was accompanied by his wife Barry Cook. He he does Arboriculturist. He has a history of smoking 1 pack per day for around 45 years and unfortunately he continues to smoke. He also drinks alcohol on daily basis was no history of drug abuse.   Social History Social History  Substance Use Topics  . Smoking status: Current Every Day Smoker -- 1.00 packs/day    Types: Cigarettes  . Smokeless tobacco: None  . Alcohol Use: 7.2 oz/week    12 Cans of beer per week    No Known Allergies  Current Outpatient Prescriptions  Medication Sig Dispense Refill  . oxyCODONE-acetaminophen (ROXICET) 5-325 MG tablet Take 1 tablet by mouth every 4 (four) hours as needed for severe pain. 30 tablet 0  . traZODone (DESYREL) 50 MG tablet Take 1 tablet (50 mg total) by mouth at bedtime as needed and may repeat dose one time if needed for sleep. 60 tablet 0  . traMADol (ULTRAM) 50 MG tablet Take 1 tablet (50 mg total) by mouth every 6 (six) hours as needed. 60 tablet 0   No current facility-administered medications for this visit.    Review of Systems  Constitutional: positive for anorexia, fatigue and  weight loss Eyes: negative Ears, nose, mouth, throat, and face: negative Respiratory: positive for cough, dyspnea on exertion, hemoptysis, sputum and wheezing Cardiovascular: negative Gastrointestinal: negative Genitourinary:negative Integument/breast: negative Hematologic/lymphatic:  negative Musculoskeletal:positive for bone pain and neck pain Neurological: negative Behavioral/Psych: negative Endocrine: negative Allergic/Immunologic: negative  Physical Exam  ZOX:WRUEA, healthy, no distress, well developed and malnourished SKIN: skin color, texture, turgor are normal, no rashes or significant lesions HEAD: Normocephalic, No masses, lesions, tenderness or abnormalities EYES: normal, PERRLA, Conjunctiva are pink and non-injected EARS: External ears normal, Canals clear OROPHARYNX:no exudate, no erythema and lips, buccal mucosa, and tongue normal  NECK: supple, no adenopathy, no JVD LYMPH:  no palpable lymphadenopathy, no hepatosplenomegaly LUNGS: expiratory wheezes bilaterally, scattered rhonchi bilaterally HEART: regular rate & rhythm, no murmurs and no gallops ABDOMEN:abdomen soft, non-tender, normal bowel sounds and no masses or organomegaly BACK: Back symmetric, no curvature., No CVA tenderness EXTREMITIES:no joint deformities, effusion, or inflammation, no edema, no skin discoloration  NEURO: alert & oriented x 3 with fluent speech, no focal motor/sensory deficits  PERFORMANCE STATUS: ECOG 1  LABORATORY DATA: Lab Results  Component Value Date   WBC 7.7 11/07/2012   HGB 13.9 03/07/2015   HCT 41.0 03/07/2015   MCV 96.4 11/07/2012   PLT 154 11/07/2012      Chemistry      Component Value Date/Time   NA 138 03/07/2015 2152   K 3.9 03/07/2015 2152   CL 102 03/07/2015 2152   CO2 27 11/07/2012 0712   BUN 4* 03/07/2015 2152   CREATININE 0.70 03/07/2015 2152      Component Value Date/Time   CALCIUM 8.8 11/07/2012 0712   ALKPHOS 66 11/07/2012 0712   AST 20 11/07/2012 0712   ALT 14 11/07/2012 0712   BILITOT 0.8 11/07/2012 0712       RADIOGRAPHIC STUDIES: Dg Chest 2 View  03/07/2015  CLINICAL DATA:  59 year old male with chest pain for 1 week. Destructive mass of the C6 vertebral body. Initial encounter. EXAM: CHEST  2 VIEW COMPARISON:  Neck CT  from today reported separately. FINDINGS: There is a cavitary appearing mass like opacity in the anterior or medial segment of the right lower lobe measuring about 8 cm diameter. There is thickening of the right peritracheal density suggesting mediastinal lymphadenopathy. There is no associated pleural effusion or pneumothorax. Large lung volumes. Mild reticulonodular density in the apices as seen on the earlier CT. Chronic left ninth rib fracture. Degenerative changes in the spine with no destructive osseous lesion identified in the thorax radiographically. IMPRESSION: Large cavitary mass like opacity in the right lower lobe. Evidence of right peritracheal lymphadenopathy. In conjunction with the earlier neck CT findings today, favor metastatic pulmonary squamous cell carcinoma. Electronically Signed   By: Genevie Ann M.D.   On: 03/07/2015 19:16   Ct Cervical Spine Wo Contrast  03/07/2015  CLINICAL DATA:  59 year old male with worsening chronic cervical spine pain radiating into right arm and shoulder. EXAM: CT CERVICAL SPINE WITHOUT CONTRAST TECHNIQUE: Multidetector CT imaging of the cervical spine was performed without intravenous contrast. Multiplanar CT image reconstructions were also generated. COMPARISON:  07/27/2003 radiographs FINDINGS: A lytic mass involving the right C6 vertebral body/pedicle/articular mass is identified with nondisplaced fracture involving the C6 right articular process. This is suspicious for malignancy/metastasis. There is no evidence of acute subluxation or prevertebral soft tissue swelling. Moderate degenerative disc disease and spondylosis from C4-C7 identified contributing to mild to moderate central spinal and bony foraminal narrowing at several levels. No other  bony abnormalities are identified. Mild centrilobular and paraseptal emphysema in the lung apices noted. IMPRESSION: Destructive mass involving the right C6 vertebral body/pedicle/articular mass with nondisplaced fracture  involving the C6 right articular process. This is suspicious for malignancy/metastasis. MRI with and without contrast is recommended for further evaluation. Moderate degenerative changes from C4-C7 contributing to mild to moderate central spinal and bony foraminal narrowing. Electronically Signed   By: Margarette Canada M.D.   On: 03/07/2015 17:47   Mr Cervical Spine W Wo Contrast  03/08/2015  CLINICAL DATA:  Constant worsening aching burning neck pain radiating to RIGHT shoulder beginning 1 month ago. Follow-up mass. EXAM: MRI CERVICAL SPINE WITHOUT AND WITH CONTRAST TECHNIQUE: Multiplanar and multiecho pulse sequences of the cervical spine, to include the craniocervical junction and cervicothoracic junction, were obtained without and with intravenous contrast. CONTRAST:  50m MULTIHANCE GADOBENATE DIMEGLUMINE 529 MG/ML IV SOLN COMPARISON:  CT cervical spine March 07, 2015 FINDINGS: Motion degraded examination. Cervical vertebral bodies and posterior elements are intact and aligned, maintenance of the cervical lordosis. Low T1, bright T2 signal within the C6 vertebral body associated with abnormal enhancement. Abnormal bright STIR signal enhancement extends into the RIGHT C6 fist sec corresponding to known nondisplaced fracture. Moderate to severe C4-5, C5-6 and moderate C6-7 disc height loss, associated subacute on chronic discogenic endplate changes. Focal smooth slight dural enhancement from C4-5 to C6, without discrete extradural mass. Spinal cord is normal morphology and signal characteristics though motion degrades sensitivity for subtle potential cord signal abnormality. No syrinx. No abnormal cord enhancement. Enhancing bright STIR signal within the RIGHT paraspinal muscles from C5-C7 partially imaged. Vertebral artery flow voids present. Level by level evaluation (Axial T2 gradient sequence, axial T1 pre sequences are severely motion degraded): C2-3: No significant disc bulge. Moderate facet arthropathy  without canal stenosis or neural foraminal narrowing. C3-4: Annular bulging, uncovertebral hypertrophy and mild facet arthropathy. Mild canal stenosis and moderate neural foraminal narrowing. C4-5: Moderate broad-based disc bulge, uncovertebral hypertrophy and mild facet arthropathy result in moderate canal stenosis and severe neural foraminal narrowing. C5-6: Small to moderate broad-based disc bulge, uncovertebral hypertrophy and moderate to severe facet arthropathy result in moderate canal stenosis, severe RIGHT greater than LEFT neural foraminal narrowing. Amorphous abnormal enhancement effacing the RIGHT foramen transversarium, there is delayed entry of the RIGHT vertebral artery, which enters at upper aspect of C5-6, predominately above the abnormal enhancement. C6-7: Moderate broad-based disc bulge, uncovertebral hypertrophy and mild facet arthropathy result in moderate canal stenosis and severe neural foraminal narrowing. C7-T1: No disc bulge, canal stenosis nor neural foraminal narrowing. IMPRESSION: Motion degraded examination. Abnormal enhancement within the RIGHT C6 vertebral body extending to the facet corresponding to destructive bony changes on today's CT, differential diagnosis includes infectious/ inflammatory changes, less likely metastasis. Associated RIGHT paraspinal muscle strain versus myositis. Recommend correlation with ESR/ CRP, histopathologic sampling could be considered. Degenerative changes cervical spine resulting in moderate canal stenosis C4-5 through C6-7. Neural foraminal narrowing C3-4 thru C6-7: Severe from C4-5 through C6-7. Electronically Signed   By: CElon AlasM.D.   On: 03/08/2015 03:08    ASSESSMENT: This is a very pleasant 59years old white male was highly suspicious metastatic lung cancer presented with cavitary mass in the right lung in addition to metastatic bone lesion. The only imaging studies available for this patient are the CT scan and MRI of the neck in  addition to chest x-ray.   PLAN: I had a lengthy discussion with the patient and his wife today  about his current condition and further investigation to confirm his diagnosis. I will send the patient today to have CT scan of the chest for further evaluation of his disease in the chest. I will also order a PET scan as well as MRI of the brain to complete the staging workup. Once these imaging studies are available, I will arrange for the patient to have a biopsy for tissue diagnosis either with CT guided core biopsy or bronchoscopy and endobronchial ultrasound biopsy. For pain management, I started the patient on tramadol 50 mg by mouth every 6 hours as needed for pain. The patient would also see Dr. Sondra Come from radiation oncology for consideration of palliative radiotherapy to the metastatic disease in the neck area. The patient was seen during the multidisciplinary thoracic oncology clinic today by medical oncology, radiation oncology, thoracic navigator, social worker and physical therapist. I will arrange for the patient to come back for follow-up visit in less than 2 weeks for reevaluation and more detailed discussion of his treatment options based on the final pathology and imaging studies. The patient was advised to call immediately if he has any concerning symptoms in the interval. The patient voices understanding of current disease status and treatment options and is in agreement with the current care plan.  All questions were answered. The patient knows to call the clinic with any problems, questions or concerns. We can certainly see the patient much sooner if necessary.  Thank you so much for allowing me to participate in the care of Barry Cook. I will continue to follow up the patient with you and assist in his care.  I spent 40 minutes counseling the patient face to face. The total time spent in the appointment was 60 minutes.  Disclaimer: This note was dictated with voice  recognition software. Similar sounding words can inadvertently be transcribed and may not be corrected upon review.   , K. March 16, 2015, 3:02 PM

## 2015-03-16 NOTE — Progress Notes (Signed)
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 male who presented to the ED on 03/08/15 for neck and right shoulder pain that persisted for a month. CT of the cervical spine that day found a destructive mass on the right of the C6 vertebral body with a nondisplaced fracture involving the C6 right articular process. This was suspicious for malignancy. A subsequent chest X-ray found a large cavitary mass like opacity in the right lower lobe that was indicative of right peritracheal lymphadenopathy. MRI of the cervical spine later showed degenerative changes to the cervical spine of C4-C7 and abnormal enhancement within the right of the C6 vertebral body. The patient presents to lung clinic for the consideration of radiotherapy for the management of his disease.  PREVIOUS RADIATION THERAPY: No  PAST MEDICAL HISTORY:  has no past medical history on file.  no prescription medications   PAST SURGICAL HISTORY:No past surgical history on file. No prior surgeries  FAMILY HISTORY: family history is not on file.  SOCIAL HISTORY:  reports that he has been smoking Cigarettes.  He has been smoking about 1.00 pack per day. He does not have any smokeless tobacco history on file. He reports that he drinks about 7.2 oz of alcohol per week. He reports that he uses illicit drugs (Cocaine).  ALLERGIES: Review of patient's allergies indicates no known allergies.  MEDICATIONS:  Current Outpatient Prescriptions  Medication Sig Dispense Refill  . oxyCODONE-acetaminophen (ROXICET) 5-325 MG tablet Take 1 tablet by mouth every 4 (four) hours as needed for  severe pain. 30 tablet 0  . traMADol (ULTRAM) 50 MG tablet Take 1 tablet (50 mg total) by mouth every 6 (six) hours as needed. 60 tablet 0  . traZODone (DESYREL) 50 MG tablet Take 1 tablet (50 mg total) by mouth at bedtime as needed and may repeat dose one time if needed for sleep. 60 tablet 0   No current facility-administered medications for this encounter.    REVIEW OF SYSTEMS:  A 15 point review of systems is documented in the electronic medical record. This was obtained by the nursing staff. However, I reviewed this with the patient to discuss relevant findings and make appropriate changes.  He reports neck and right shoulder pain that has been gradually becoming worse since diagnosis. He also reports some right arm weakness, hemoptysis, and fatigue. He denies leg weakness, headaches, or blurred vision. He reports a 15 lb weight loss over the past couple of months and attributes this to a loss of appetite. He denies any medical surgeries. He is still working as a Games developer. The patient presents to the clinic with his wife.  He reports fatigue   PHYSICAL EXAM:  Vitals with BMI 03/16/2015  Height 6' 0"   Weight 156 lbs 2 oz  BMI 54.0  Systolic 086  Diastolic 67  Pulse 87  Respirations 18  General: Alert and oriented, in no acute distress HEENT: Head is normocephalic. Extraocular movements are intact. Oropharynx is clear. The patient is edentulous with top dentures. Neck: Neck is supple, no palpable cervical or supraclavicular lymphadenopathy. Heart: Regular in rate and rhythm with no murmurs, rubs, or gallops. Chest: Some noted  wheezing along the right side. Extremities: No cyanosis or edema. Lymphatics: see Neck Exam Skin: No concerning lesions. Musculoskeletal: symmetric strength and muscle tone throughout. Neurologic: Cranial nerves II through XII are grossly intact. No obvious focalities. Speech is fluent. Coordination is intact. Psychiatric: Judgment and insight are intact.  Affect is appropriate.  ECOG = 1  LABORATORY DATA:  Lab Results  Component Value Date   WBC 7.7 11/07/2012   HGB 13.9 03/07/2015   HCT 41.0 03/07/2015   MCV 96.4 11/07/2012   PLT 154 11/07/2012   Lab Results  Component Value Date   NA 138 03/07/2015   K 3.9 03/07/2015   CL 102 03/07/2015   CO2 27 11/07/2012   GLUCOSE 96 03/07/2015   CREATININE 0.70 03/07/2015   CALCIUM 8.8 11/07/2012      RADIOGRAPHY: Dg Chest 2 View  03/07/2015  CLINICAL DATA:  59 year old male with chest pain for 1 week. Destructive mass of the C6 vertebral body. Initial encounter. EXAM: CHEST  2 VIEW COMPARISON:  Neck CT from today reported separately. FINDINGS: There is a cavitary appearing mass like opacity in the anterior or medial segment of the right lower lobe measuring about 8 cm diameter. There is thickening of the right peritracheal density suggesting mediastinal lymphadenopathy. There is no associated pleural effusion or pneumothorax. Large lung volumes. Mild reticulonodular density in the apices as seen on the earlier CT. Chronic left ninth rib fracture. Degenerative changes in the spine with no destructive osseous lesion identified in the thorax radiographically. IMPRESSION: Large cavitary mass like opacity in the right lower lobe. Evidence of right peritracheal lymphadenopathy. In conjunction with the earlier neck CT findings today, favor metastatic pulmonary squamous cell carcinoma. Electronically Signed   By: Genevie Ann M.D.   On: 03/07/2015 19:16   Ct Cervical Spine Wo Contrast  03/07/2015  CLINICAL DATA:  59 year old male with worsening chronic cervical spine pain radiating into right arm and shoulder. EXAM: CT CERVICAL SPINE WITHOUT CONTRAST TECHNIQUE: Multidetector CT imaging of the cervical spine was performed without intravenous contrast. Multiplanar CT image reconstructions were also generated. COMPARISON:  07/27/2003 radiographs FINDINGS: A lytic mass involving the right C6 vertebral  body/pedicle/articular mass is identified with nondisplaced fracture involving the C6 right articular process. This is suspicious for malignancy/metastasis. There is no evidence of acute subluxation or prevertebral soft tissue swelling. Moderate degenerative disc disease and spondylosis from C4-C7 identified contributing to mild to moderate central spinal and bony foraminal narrowing at several levels. No other bony abnormalities are identified. Mild centrilobular and paraseptal emphysema in the lung apices noted. IMPRESSION: Destructive mass involving the right C6 vertebral body/pedicle/articular mass with nondisplaced fracture involving the C6 right articular process. This is suspicious for malignancy/metastasis. MRI with and without contrast is recommended for further evaluation. Moderate degenerative changes from C4-C7 contributing to mild to moderate central spinal and bony foraminal narrowing. Electronically Signed   By: Margarette Canada M.D.   On: 03/07/2015 17:47   Mr Cervical Spine W Wo Contrast  03/08/2015  CLINICAL DATA:  Constant worsening aching burning neck pain radiating to RIGHT shoulder beginning 1 month ago. Follow-up mass. EXAM: MRI CERVICAL SPINE WITHOUT AND WITH CONTRAST TECHNIQUE: Multiplanar and multiecho pulse sequences of the cervical spine, to include the craniocervical junction and cervicothoracic junction, were obtained without and with intravenous contrast. CONTRAST:  72m MULTIHANCE GADOBENATE DIMEGLUMINE 529 MG/ML IV SOLN COMPARISON:  CT cervical spine March 07, 2015 FINDINGS: Motion degraded examination. Cervical vertebral bodies and posterior elements are intact and aligned,  maintenance of the cervical lordosis. Low T1, bright T2 signal within the C6 vertebral body associated with abnormal enhancement. Abnormal bright STIR signal enhancement extends into the RIGHT C6 fist sec corresponding to known nondisplaced fracture. Moderate to severe C4-5, C5-6 and moderate C6-7 disc height  loss, associated subacute on chronic discogenic endplate changes. Focal smooth slight dural enhancement from C4-5 to C6, without discrete extradural mass. Spinal cord is normal morphology and signal characteristics though motion degrades sensitivity for subtle potential cord signal abnormality. No syrinx. No abnormal cord enhancement. Enhancing bright STIR signal within the RIGHT paraspinal muscles from C5-C7 partially imaged. Vertebral artery flow voids present. Level by level evaluation (Axial T2 gradient sequence, axial T1 pre sequences are severely motion degraded): C2-3: No significant disc bulge. Moderate facet arthropathy without canal stenosis or neural foraminal narrowing. C3-4: Annular bulging, uncovertebral hypertrophy and mild facet arthropathy. Mild canal stenosis and moderate neural foraminal narrowing. C4-5: Moderate broad-based disc bulge, uncovertebral hypertrophy and mild facet arthropathy result in moderate canal stenosis and severe neural foraminal narrowing. C5-6: Small to moderate broad-based disc bulge, uncovertebral hypertrophy and moderate to severe facet arthropathy result in moderate canal stenosis, severe RIGHT greater than LEFT neural foraminal narrowing. Amorphous abnormal enhancement effacing the RIGHT foramen transversarium, there is delayed entry of the RIGHT vertebral artery, which enters at upper aspect of C5-6, predominately above the abnormal enhancement. C6-7: Moderate broad-based disc bulge, uncovertebral hypertrophy and mild facet arthropathy result in moderate canal stenosis and severe neural foraminal narrowing. C7-T1: No disc bulge, canal stenosis nor neural foraminal narrowing. IMPRESSION: Motion degraded examination. Abnormal enhancement within the RIGHT C6 vertebral body extending to the facet corresponding to destructive bony changes on today's CT, differential diagnosis includes infectious/ inflammatory changes, less likely metastasis. Associated RIGHT paraspinal  muscle strain versus myositis. Recommend correlation with ESR/ CRP, histopathologic sampling could be considered. Degenerative changes cervical spine resulting in moderate canal stenosis C4-5 through C6-7. Neural foraminal narrowing C3-4 thru C6-7: Severe from C4-5 through C6-7. Electronically Signed   By: Elon Alas M.D.   On: 03/08/2015 03:08      IMPRESSION: Probable Stage IV Lung Cancer with Cervical Spine Metastasis  PLAN: The patient will proceed with a chest CT today and a lung biopsy next week. He will be scheduled for CT simulation next week for palliative radiation treatment to his cervical spine and he will start treatment after his lung biopsy results are available.    This document serves as a record of services personally performed by Gery Pray, MD. It was created on his behalf by Darcus Austin, a trained medical scribe. The creation of this record is based on the scribe's personal observations and the provider's statements to them. This document has been checked and approved by the attending provider.  ------------------------------------------------  Blair Promise, PhD, MD

## 2015-03-16 NOTE — Patient Instructions (Addendum)

## 2015-03-16 NOTE — Telephone Encounter (Signed)
s.w. pt wife and advised on OCT appt....ok and aware °

## 2015-03-16 NOTE — Progress Notes (Signed)
[place] Clinical Social Work  Clinical Social Work met with patient/family and Futures trader at [facility] appointment to offer support and assess for psychosocial needs.  Medical oncologist reviewed patient's scan and need for additional testing to determine diagnosis with patient/family.  Barry Cook reported his main symptom is uncontrollable pain in right neck/shoulder area.  He also reported coughing blood.  Patient was accompanied by his wife.  The patient works Engineer, maintenance.  CSW and patient/family discussed that patient may not be able to participate in physical labor at this time.  Patient/family plan to contact CSW once obtain diagnosis to discuss applying for social security disability.  The patient does not have insurance.  He indicated finances are a concern for them.  Patient/family plan to see financial advocate today.  Clinical Social Work briefly discussed Clinical Social Work role and [employer] support programs/services.  Clinical Social Work encouraged patient to call with any additional questions or concerns.   Barry Cook, MSW, LCSW, OSW-C Clinical Social Worker [facility] [phone]

## 2015-03-17 ENCOUNTER — Telehealth: Payer: Self-pay | Admitting: *Deleted

## 2015-03-17 NOTE — Telephone Encounter (Signed)
Oncology Nurse Navigator Documentation  Oncology Nurse Navigator Flowsheets 03/17/2015  Navigator Encounter Type Telephone/I followed up to see if patient had PET or MRI Brain scheduled.  They have not been yet.  I called central scheduling to schedule.  I called patient to update on Nov 1st appt arrive at [facility] at 10:30.  Also gave him instructions of NPO 6 hours prior to PET scan.  He verbalized understanding of appt time, place and pre scan instructions.    Patient Visit Type Follow-up  Treatment Phase Abnormal Scans  Interventions Coordination of Care  Coordination of Care Radiology  Time Spent with Patient 30

## 2015-03-20 ENCOUNTER — Telehealth: Payer: Self-pay | Admitting: *Deleted

## 2015-03-20 NOTE — Telephone Encounter (Signed)
TC from pt's wife stating that Mr. fukuda is in significant pain, rating it 8-10, in his right should and between his shoulder blades. Pain/numbness radiate down his arms. He was started on tramadol 50 mg every 4-6 hours for this but it is not controlling the pain. Requesting something stronger for pain control. Please call when prescription is available for pick up today.

## 2015-03-20 NOTE — Telephone Encounter (Signed)
He is scheduled to receive radiation. This will help.

## 2015-03-23 ENCOUNTER — Ambulatory Visit
Admission: RE | Admit: 2015-03-23 | Discharge: 2015-03-23 | Disposition: A | Discharge status: Home / Self Care | Payer: Medicaid Other | Source: Ambulatory Visit | Attending: Radiation Oncology | Admitting: Radiation Oncology

## 2015-03-23 DIAGNOSIS — C7970 Secondary malignant neoplasm of unspecified adrenal gland: Secondary | ICD-10-CM | POA: Insufficient documentation

## 2015-03-23 DIAGNOSIS — R918 Other nonspecific abnormal finding of lung field: Secondary | ICD-10-CM

## 2015-03-23 DIAGNOSIS — Z510 Encounter for antineoplastic radiation therapy: Secondary | ICD-10-CM | POA: Diagnosis not present

## 2015-03-23 DIAGNOSIS — C7951 Secondary malignant neoplasm of bone: Secondary | ICD-10-CM | POA: Diagnosis not present

## 2015-03-23 DIAGNOSIS — C801 Malignant (primary) neoplasm, unspecified: Secondary | ICD-10-CM | POA: Diagnosis not present

## 2015-03-23 NOTE — Progress Notes (Signed)
  Radiation Oncology         [phone] ________________________________  Name: Barry Cook MRN: [id number]  Date: 03/23/2015  DOB: [birth year]  SIMULATION AND TREATMENT PLANNING NOTE    ICD-9-CM ICD-10-CM   1. Lung mass 786.6 R91.8    DIAGNOSIS: Probable Stage IV Lung Cancer with Cervical Spine Metastasis   NARRATIVE:  The patient was brought to the [place] suite.  Identity was confirmed.  All relevant records and images related to the planned course of therapy were reviewed.  The patient freely provided informed written consent to proceed with treatment after reviewing the details related to the planned course of therapy. The consent form was witnessed and verified by the simulation staff.  Then, the patient was set-up in a stable reproducible  supine position for radiation therapy.  CT images were obtained.  Surface markings were placed.  The CT images were loaded into the planning software.  Then the target and avoidance structures were contoured.  Treatment planning then occurred.  The radiation prescription was entered and confirmed.  Then, I designed and supervised the construction of a total of 4 medically necessary complex treatment devices.  I have requested : 3D Simulation  I have requested a DVH of the following structures: GTV, brachial plexus, larynx, spinal cord.  I have ordered:dose calc.  PLAN:  The patient will receive 30 Gy in 10 fractions. Today I discussed with the patient that we do not have a tissue diagnosis but given the patient's pain severity he wishes to proceed with palliative radiation therapy without tissue diagnosis.  he will proceed with additional staging and biopsy as soon as possible to confirm diagnosis.  ________________________________  -----------------------------------  Blair Promise, PhD, MD

## 2015-03-27 ENCOUNTER — Other Ambulatory Visit (HOSPITAL_BASED_OUTPATIENT_CLINIC_OR_DEPARTMENT_OTHER): Payer: Medicaid Other

## 2015-03-27 ENCOUNTER — Ambulatory Visit (HOSPITAL_BASED_OUTPATIENT_CLINIC_OR_DEPARTMENT_OTHER): Payer: Medicaid Other | Admitting: Physician Assistant

## 2015-03-27 ENCOUNTER — Encounter: Payer: Self-pay | Admitting: Internal Medicine

## 2015-03-27 VITALS — BP 120/72 | HR 39 | Temp 97.7°F | Resp 18 | Ht 72.0 in | Wt 158.4 lb

## 2015-03-27 DIAGNOSIS — R918 Other nonspecific abnormal finding of lung field: Secondary | ICD-10-CM

## 2015-03-27 DIAGNOSIS — K5900 Constipation, unspecified: Secondary | ICD-10-CM | POA: Insufficient documentation

## 2015-03-27 DIAGNOSIS — G893 Neoplasm related pain (acute) (chronic): Secondary | ICD-10-CM

## 2015-03-27 DIAGNOSIS — C7970 Secondary malignant neoplasm of unspecified adrenal gland: Secondary | ICD-10-CM | POA: Insufficient documentation

## 2015-03-27 LAB — CBC WITH DIFFERENTIAL/PLATELET
BASO%: 0.3 % (ref 0.0–2.0)
Basophils Absolute: 0 10*3/uL (ref 0.0–0.1)
EOS ABS: 0.2 10*3/uL (ref 0.0–0.5)
EOS%: 3.1 % (ref 0.0–7.0)
HEMATOCRIT: 41.6 % (ref 38.4–49.9)
HEMOGLOBIN: 13.7 g/dL (ref 13.0–17.1)
LYMPH#: 1.6 10*3/uL (ref 0.9–3.3)
LYMPH%: 21.5 % (ref 14.0–49.0)
MCH: 30.8 pg (ref 27.2–33.4)
MCHC: 32.9 g/dL (ref 32.0–36.0)
MCV: 93.5 fL (ref 79.3–98.0)
MONO#: 0.6 10*3/uL (ref 0.1–0.9)
MONO%: 7.4 % (ref 0.0–14.0)
NEUT%: 67.7 % (ref 39.0–75.0)
NEUTROS ABS: 5.1 10*3/uL (ref 1.5–6.5)
PLATELETS: 207 10*3/uL (ref 140–400)
RBC: 4.45 10*6/uL (ref 4.20–5.82)
RDW: 12.6 % (ref 11.0–14.6)
WBC: 7.5 10*3/uL (ref 4.0–10.3)

## 2015-03-27 LAB — COMPREHENSIVE METABOLIC PANEL (CC13)
ALBUMIN: 2.9 g/dL — AB (ref 3.5–5.0)
ALK PHOS: 75 U/L (ref 40–150)
ANION GAP: 7 meq/L (ref 3–11)
AST: 11 U/L (ref 5–34)
BILIRUBIN TOTAL: 0.4 mg/dL (ref 0.20–1.20)
BUN: 8.2 mg/dL (ref 7.0–26.0)
CALCIUM: 9.2 mg/dL (ref 8.4–10.4)
CO2: 26 mEq/L (ref 22–29)
CREATININE: 0.7 mg/dL (ref 0.7–1.3)
Chloride: 103 mEq/L (ref 98–109)
Glucose: 100 mg/dl (ref 70–140)
Potassium: 4.1 mEq/L (ref 3.5–5.1)
Sodium: 136 mEq/L (ref 136–145)
TOTAL PROTEIN: 6.4 g/dL (ref 6.4–8.3)

## 2015-03-27 NOTE — Progress Notes (Signed)
Pt' spouse came by to inquire about financial assistance. She states she was told she could get vouchers for food and gas and he has a medication that he didn't get filled because they have no money and no insurance. Called Meredith(Fin Adv) in Radiation because that is who pt said she was suppose to meet with. Ailene Ravel agreed to meet. Escorted her downstairs.

## 2015-03-27 NOTE — Progress Notes (Signed)
Hematology and Oncology Follow Up Visit  Barry Cook [id number] [birth year] 59 y.o. 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 Clabe Seal guidance   History:  Mr. Barry Cook is a 59 year old male referred to the [facility] for the treatment of probable Metastatic Lung Cancer to the bone. He has had his first simulation on 10/27 and is eager to start these radiation treatments as soon as possible. Pain is 4/10 at this time with the current regimen. He denies any worsening shortness of breath, cough or hemoptysis. He denies any chest pain or palpitations. He has experienced mild constipation due to this pain meds for which he has began taking laxative support. He denies any numbness or tingling. He denies any headaches, or vision changes.  He denies any bleeding issues. He continues to work. Ambulating without difficulty.  Medications: reviewed.   Allergies: No Known Allergies  Past Medical History, Surgical history, Social history, and Family History were reviewed and updated.  Review of Systems: ROS  Remarkable for constipation and bone pain Remaining ROS negative.  Physical Exam: Blood pressure 120/72, pulse 39, temperature 97.7 F (36.5 C), temperature source Oral, resp. rate 18, height 6' (1.829 m), weight 158 lb 6.4 oz (71.85 kg), SpO2 98 %.  Physical Exam  LABS: CBC Latest Ref Rng 03/27/2015 03/07/2015 11/07/2012  WBC 4.0 - 10.3 10e3/uL 7.5 - 7.7  Hemoglobin 13.0 - 17.1 g/dL 13.7 13.9 16.6  Hematocrit 38.4 - 49.9 % 41.6 41.0 47.8  Platelets 140 - 400 10e3/uL 207 - 154   CMP Latest Ref Rng 03/27/2015 03/07/2015 11/07/2012  Glucose 70 - 140 mg/dl 100 96 89  BUN 7.0 - 26.0 mg/dL 8.2 4(L) 9  Creatinine 0.7 - 1.3 mg/dL 0.7 0.70 0.90  Sodium 136 - 145 mEq/L 136 138 134(L)  Potassium  3.5 - 5.1 mEq/L 4.1 3.9 4.1  Chloride 101 - 111 mmol/L - 102 99  CO2 22 - 29 mEq/L 26 - 27  Calcium 8.4 - 10.4 mg/dL 9.2 - 8.8  Total Protein 6.4 - 8.3 g/dL 6.4 - 6.7  Total Bilirubin 0.20 - 1.20 mg/dL 0.40 - 0.8  Alkaline Phos 40 - 150 U/L 75 - 66  AST 5 - 34 U/L 11 - 20  ALT 0 - 55 U/L <9 - 14    Radiological Studies: No new images since last visit  Impression and Plan:  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 He is scheduled for MRI brain, PET scan on 03/30/15. He will be scheduled for biopsy of the adrenal gland by IR as soon as possible for tissue diagnosis Simulation has been initiated on 10/27 and he is to continue radiation treatments as per Dr. Sondra Come Patient will return in 1 week for discussion of the results with Dr. Julien Nordmann  Pain due to bone metastases For pain management, he was started on tramadol 50 mg by mouth every 6 hours as needed for pain, and oxycodone 1-2 by mouth as needed for pain Pain should improve with radiation  Constipation Likely due to pain meds He was instructed to take bowel support with laxatives while on pain medications  The patient was advised to call immediately if he has any concerning symptoms in the interval. The patient voices understanding of current disease status and treatment options and  is in agreement with the current care plan.   This is a shared visit. Plan discussed with Dr. Julien Nordmann.    [name] E, PA-C 10/31/20169:11 AM  ADDENDUM: Hematology/Oncology Attending: I had a face to face encounter with the patient today. I recommended his care plan. 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 Unfortunately his PET scan and MRI of the brain were not performed and scheduled to be done tomorrow. The patient was seen by Dr. Sondra Come from radiation oncology and expected to start palliative radiotherapy soon to the painful lesion at the cervical spine. I will arrange for the patient to have CT-guided core biopsy of one of the metastatic adrenal masses by interventional radiology. I will see the patient back for follow-up visit in one week for reevaluation and more discussion of his systemic treatment options based on the final pathology and imaging studies. For pain management the patient will continue on oxycodone as prescribed by Dr. Sondra Come for now. He was advised to call immediately if he has any concerning symptoms in the interval.  Disclaimer: This note was dictated with voice recognition software. Similar sounding words can inadvertently be transcribed and may be missed upon review. [name]., MD 03/27/2015

## 2015-03-28 ENCOUNTER — Ambulatory Visit (HOSPITAL_COMMUNITY)
Admission: RE | Admit: 2015-03-28 | Discharge: 2015-03-28 | Disposition: A | Discharge status: Home / Self Care | Payer: Medicaid Other | Source: Ambulatory Visit | Attending: Internal Medicine | Admitting: Internal Medicine

## 2015-03-28 DIAGNOSIS — C801 Malignant (primary) neoplasm, unspecified: Secondary | ICD-10-CM | POA: Insufficient documentation

## 2015-03-28 DIAGNOSIS — C7971 Secondary malignant neoplasm of right adrenal gland: Secondary | ICD-10-CM | POA: Insufficient documentation

## 2015-03-28 DIAGNOSIS — R918 Other nonspecific abnormal finding of lung field: Secondary | ICD-10-CM | POA: Insufficient documentation

## 2015-03-28 DIAGNOSIS — J90 Pleural effusion, not elsewhere classified: Secondary | ICD-10-CM | POA: Insufficient documentation

## 2015-03-28 DIAGNOSIS — C7989 Secondary malignant neoplasm of other specified sites: Secondary | ICD-10-CM | POA: Diagnosis not present

## 2015-03-28 DIAGNOSIS — I700 Atherosclerosis of aorta: Secondary | ICD-10-CM | POA: Insufficient documentation

## 2015-03-28 DIAGNOSIS — C7951 Secondary malignant neoplasm of bone: Secondary | ICD-10-CM | POA: Insufficient documentation

## 2015-03-28 DIAGNOSIS — C7972 Secondary malignant neoplasm of left adrenal gland: Secondary | ICD-10-CM | POA: Diagnosis not present

## 2015-03-28 DIAGNOSIS — C7801 Secondary malignant neoplasm of right lung: Secondary | ICD-10-CM | POA: Insufficient documentation

## 2015-03-28 DIAGNOSIS — R188 Other ascites: Secondary | ICD-10-CM | POA: Insufficient documentation

## 2015-03-28 DIAGNOSIS — C786 Secondary malignant neoplasm of retroperitoneum and peritoneum: Secondary | ICD-10-CM | POA: Diagnosis not present

## 2015-03-28 DIAGNOSIS — C778 Secondary and unspecified malignant neoplasm of lymph nodes of multiple regions: Secondary | ICD-10-CM | POA: Diagnosis not present

## 2015-03-28 LAB — GLUCOSE, CAPILLARY: GLUCOSE-CAPILLARY: 92 mg/dL (ref 65–99)

## 2015-03-28 MED ORDER — FLUDEOXYGLUCOSE F - 18 (FDG) INJECTION
7.8000 | Freq: Once | INTRAVENOUS | Status: DC | PRN
  Administered 2015-03-28: 7.8 via INTRAVENOUS
  Filled 2015-03-28: qty 7.8

## 2015-03-28 MED ORDER — GADOBENATE DIMEGLUMINE 529 MG/ML IV SOLN
15.0000 mL | Freq: Once | INTRAVENOUS | Status: AC | PRN
  Administered 2015-03-28: 15 mL via INTRAVENOUS

## 2015-03-29 ENCOUNTER — Telehealth: Payer: Self-pay | Admitting: *Deleted

## 2015-03-29 ENCOUNTER — Telehealth: Payer: Self-pay | Admitting: Internal Medicine

## 2015-03-29 ENCOUNTER — Ambulatory Visit
Admission: RE | Admit: 2015-03-29 | Discharge: 2015-03-29 | Disposition: A | Discharge status: Home / Self Care | Payer: Medicaid Other | Source: Ambulatory Visit | Attending: Radiation Oncology | Admitting: Radiation Oncology

## 2015-03-29 ENCOUNTER — Encounter: Payer: Self-pay | Admitting: *Deleted

## 2015-03-29 DIAGNOSIS — R918 Other nonspecific abnormal finding of lung field: Secondary | ICD-10-CM

## 2015-03-29 DIAGNOSIS — C7970 Secondary malignant neoplasm of unspecified adrenal gland: Secondary | ICD-10-CM

## 2015-03-29 DIAGNOSIS — Z510 Encounter for antineoplastic radiation therapy: Secondary | ICD-10-CM | POA: Diagnosis not present

## 2015-03-29 NOTE — Telephone Encounter (Signed)
Missed call from [place].  Asking what this is in reference to.  Central scheduling number provided.  Pending biopsy needed.

## 2015-03-29 NOTE — Telephone Encounter (Signed)
Oncology Nurse Navigator Documentation  Oncology Nurse Navigator Flowsheets 03/29/2015  Navigator Encounter Type Telephone/called to follow up with patient.  His wife answered the phone.  I asked if they had an opportunity to speak with FA.  Wife stated yes and will be getting forms for them this week from patients work.  I asked if she was aware of appt for biopsy.  She stated yes.  I stated I will update scheduling for follow up appt with Dr. Julien Nordmann.  Questions and concerns addressed.   Patient Visit Type Follow-up  Treatment Phase Abnormal Scans  Interventions Coordination of Care  Coordination of Care MD Appointments  Time Spent with Patient 15

## 2015-03-29 NOTE — Progress Notes (Signed)
  Radiation Oncology         [phone] ________________________________  Name: Barry Cook MRN: [id number]  Date: 03/29/2015  DOB: 03-07-1956  Simulation Verification Note    ICD-9-CM ICD-10-CM   1. Lung mass 786.6 R91.8   2. 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 The clinical setup was verified. Then port films were obtained and uploaded to the radiation oncology medical record software.  The treatment beams were carefully compared against the planned radiation fields. The position location and shape of the radiation fields was reviewed. They targeted volume of tissue appears to be appropriately covered by the radiation beams. Organs at risk appear to be excluded as planned.  Based on my personal review, I approved the simulation verification. The patient's treatment will proceed as planned.  -----------------------------------  Blair Promise, PhD, MD

## 2015-03-29 NOTE — Telephone Encounter (Signed)
s.w. pt wife and advised on NOV appt....ok and aware

## 2015-03-30 ENCOUNTER — Ambulatory Visit
Admission: RE | Admit: 2015-03-30 | Discharge: 2015-03-30 | Disposition: A | Discharge status: Home / Self Care | Payer: Medicaid Other | Source: Ambulatory Visit | Attending: Radiation Oncology | Admitting: Radiation Oncology

## 2015-03-30 DIAGNOSIS — Z510 Encounter for antineoplastic radiation therapy: Secondary | ICD-10-CM | POA: Diagnosis not present

## 2015-03-31 ENCOUNTER — Telehealth: Payer: Self-pay | Admitting: Oncology

## 2015-03-31 ENCOUNTER — Encounter: Payer: Self-pay | Admitting: *Deleted

## 2015-03-31 ENCOUNTER — Telehealth: Payer: Self-pay | Admitting: Internal Medicine

## 2015-03-31 ENCOUNTER — Other Ambulatory Visit: Payer: Self-pay | Admitting: Radiation Oncology

## 2015-03-31 ENCOUNTER — Ambulatory Visit
Admission: RE | Admit: 2015-03-31 | Discharge: 2015-03-31 | Disposition: A | Discharge status: Home / Self Care | Payer: Medicaid Other | Source: Ambulatory Visit | Attending: Radiation Oncology | Admitting: Radiation Oncology

## 2015-03-31 DIAGNOSIS — Z510 Encounter for antineoplastic radiation therapy: Secondary | ICD-10-CM | POA: Diagnosis not present

## 2015-03-31 MED ORDER — OXYCODONE-ACETAMINOPHEN 5-325 MG PO TABS: 1.0000 | ORAL_TABLET | ORAL | Status: DC | PRN

## 2015-03-31 NOTE — Telephone Encounter (Signed)
returned call and lvm for pt that 11.8 md visit cx bx not sched until 11.9..Marland KitchenMarland KitchenMarland Kitchenpt already had appt on 11.21....advised pt to call back to ck if MD has any cancellations

## 2015-03-31 NOTE — Progress Notes (Addendum)
Received patient in the clinic following second radiation treatment. Oriented patient to staff and routine of the clinic. Educated patient reference potential side effects and management such as fatigue, skin changes, mouth changes, throat changes and hoarseness. Provided patient with RADIATION THERAPY AND YOU handbook then, reviewed pertinent information. Afforded the patient the opportunity to ask questions and answered those to the best of my ability. Patient verbalized understanding of all reviewed.

## 2015-03-31 NOTE — Progress Notes (Signed)
[place] Clinical Social Work  Clinical Social Work referred patient to [employer] for disability assistance.  Polo Riley, MSW, LCSW, OSW-C Clinical Social Worker [facility] [phone]

## 2015-03-31 NOTE — Progress Notes (Signed)
Oriented patient to staff and routine of the clinic

## 2015-03-31 NOTE — Telephone Encounter (Signed)
Barry Cook regarding his pain medication.  He said he has contacted Dr. Irven [place] office to refill the medication.  He will call back if he is not able to have the medication filled.

## 2015-04-03 ENCOUNTER — Telehealth: Payer: Self-pay | Admitting: Internal Medicine

## 2015-04-03 ENCOUNTER — Ambulatory Visit
Admission: RE | Admit: 2015-04-03 | Discharge: 2015-04-03 | Disposition: A | Discharge status: Home / Self Care | Payer: Medicaid Other | Source: Ambulatory Visit | Attending: Radiation Oncology | Admitting: Radiation Oncology

## 2015-04-03 ENCOUNTER — Ambulatory Visit: Payer: Self-pay | Admitting: Nutrition

## 2015-04-03 DIAGNOSIS — Z510 Encounter for antineoplastic radiation therapy: Secondary | ICD-10-CM | POA: Diagnosis not present

## 2015-04-03 NOTE — Telephone Encounter (Signed)
returned call and advised that there are no sooner appt available at this time

## 2015-04-03 NOTE — Progress Notes (Signed)
1 diagnosed with stage IV lung cancer with cervical and spinal metastases receiving palliative radiation therapy.  Past medical history was not significant.  Medications include MiraLAXl and pain medications.  Labs include albumin 2.9 on October 31.  Height: 6 feet 0 inches. Weight: 158.4 pounds. Usual body weight: 165 pounds. BMI: 21.48.  Patient reports taking the maximum pain medication recommend and pain is controlled. Patient has begun MiraLAX, but still reports constipation. He has a poor appetite and occasional nausea. Patient does endorse weight loss cannot define how much weight he has lost  Nutrition diagnosis:  Unintended weight loss related to metastatic lung cancer and associated treatments as evidenced by 7 pounds weight loss from usual body weight.  Intervention:  Educated patient to consume frequent meals and snacks incorporating high-calorie high-protein foods. Provided a fact sheet on increasing calories and protein. Educated patient on strategies for increasing fiber and fluids in his diet to improve constipation.  Provided a fact sheet. Recommended patient begin oral nutrition supplements 2 times a day and provided samples and coupons. Questions were answered.  Teach back method was used.  Contact information was provided.  Monitoring, evaluation, goals: Patient will tolerate increased calories and protein to minimize weight loss.  He will increase fiber content and fluid content to improve constipation.  Next visit: Patient will contact the with questions or concerns.  **Disclaimer: This note was dictated with voice recognition software. Similar sounding words can inadvertently be transcribed and this note may contain transcription errors which may not have been corrected upon publication of note.**

## 2015-04-04 ENCOUNTER — Ambulatory Visit
Admission: RE | Admit: 2015-04-04 | Discharge: 2015-04-04 | Disposition: A | Discharge status: Home / Self Care | Payer: Medicaid Other | Source: Ambulatory Visit | Attending: Radiation Oncology | Admitting: Radiation Oncology

## 2015-04-04 ENCOUNTER — Other Ambulatory Visit: Payer: Self-pay | Admitting: Radiology

## 2015-04-04 ENCOUNTER — Ambulatory Visit
Admission: RE | Admit: 2015-04-04 | Discharge: 2015-04-04 | Disposition: A | Discharge status: Home / Self Care | Payer: Self-pay | Source: Ambulatory Visit | Attending: Radiation Oncology | Admitting: Radiation Oncology

## 2015-04-04 ENCOUNTER — Ambulatory Visit: Payer: Self-pay | Admitting: Internal Medicine

## 2015-04-04 DIAGNOSIS — Z510 Encounter for antineoplastic radiation therapy: Secondary | ICD-10-CM | POA: Diagnosis not present

## 2015-04-05 ENCOUNTER — Encounter (HOSPITAL_COMMUNITY): Payer: Self-pay

## 2015-04-05 ENCOUNTER — Ambulatory Visit: Payer: Medicaid Other

## 2015-04-05 ENCOUNTER — Encounter: Payer: Self-pay | Admitting: Radiation Oncology

## 2015-04-05 ENCOUNTER — Ambulatory Visit
Admission: RE | Admit: 2015-04-05 | Discharge: 2015-04-05 | Disposition: A | Discharge status: Home / Self Care | Payer: Medicaid Other | Source: Ambulatory Visit | Attending: Radiation Oncology | Admitting: Radiation Oncology

## 2015-04-05 ENCOUNTER — Ambulatory Visit (HOSPITAL_COMMUNITY)
Admission: RE | Admit: 2015-04-05 | Discharge: 2015-04-05 | Disposition: A | Discharge status: Home / Self Care | Payer: Medicaid Other | Source: Ambulatory Visit | Attending: Internal Medicine | Admitting: Internal Medicine

## 2015-04-05 VITALS — BP 93/60 | HR 68 | Temp 97.8°F | Ht 72.0 in | Wt 155.5 lb

## 2015-04-05 DIAGNOSIS — C782 Secondary malignant neoplasm of pleura: Secondary | ICD-10-CM | POA: Diagnosis not present

## 2015-04-05 DIAGNOSIS — C3490 Malignant neoplasm of unspecified part of unspecified bronchus or lung: Secondary | ICD-10-CM | POA: Insufficient documentation

## 2015-04-05 DIAGNOSIS — C7970 Secondary malignant neoplasm of unspecified adrenal gland: Secondary | ICD-10-CM

## 2015-04-05 DIAGNOSIS — C770 Secondary and unspecified malignant neoplasm of lymph nodes of head, face and neck: Secondary | ICD-10-CM | POA: Diagnosis not present

## 2015-04-05 DIAGNOSIS — Z510 Encounter for antineoplastic radiation therapy: Secondary | ICD-10-CM | POA: Diagnosis not present

## 2015-04-05 DIAGNOSIS — C7951 Secondary malignant neoplasm of bone: Secondary | ICD-10-CM | POA: Insufficient documentation

## 2015-04-05 DIAGNOSIS — F17210 Nicotine dependence, cigarettes, uncomplicated: Secondary | ICD-10-CM | POA: Insufficient documentation

## 2015-04-05 DIAGNOSIS — C801 Malignant (primary) neoplasm, unspecified: Secondary | ICD-10-CM | POA: Insufficient documentation

## 2015-04-05 DIAGNOSIS — R918 Other nonspecific abnormal finding of lung field: Secondary | ICD-10-CM

## 2015-04-05 DIAGNOSIS — R590 Localized enlarged lymph nodes: Secondary | ICD-10-CM | POA: Diagnosis present

## 2015-04-05 HISTORY — DX: Malignant (primary) neoplasm, unspecified: C80.1

## 2015-04-05 LAB — CBC
HEMATOCRIT: 37.2 % — AB (ref 39.0–52.0)
HEMOGLOBIN: 12.1 g/dL — AB (ref 13.0–17.0)
MCH: 29.9 pg (ref 26.0–34.0)
MCHC: 32.5 g/dL (ref 30.0–36.0)
MCV: 91.9 fL (ref 78.0–100.0)
Platelets: 178 10*3/uL (ref 150–400)
RBC: 4.05 MIL/uL — AB (ref 4.22–5.81)
RDW: 12.6 % (ref 11.5–15.5)
WBC: 5.9 10*3/uL (ref 4.0–10.5)

## 2015-04-05 LAB — APTT: aPTT: 39 seconds — ABNORMAL HIGH (ref 24–37)

## 2015-04-05 LAB — PROTIME-INR
INR: 1.14 (ref 0.00–1.49)
PROTHROMBIN TIME: 14.8 s (ref 11.6–15.2)

## 2015-04-05 MED ORDER — HYDROCODONE-ACETAMINOPHEN 5-325 MG PO TABS: 1.0000 | ORAL_TABLET | ORAL | Status: DC | PRN

## 2015-04-05 MED ORDER — FENTANYL CITRATE (PF) 100 MCG/2ML IJ SOLN
INTRAMUSCULAR | Status: AC | PRN
  Administered 2015-04-05: 50 ug via INTRAVENOUS

## 2015-04-05 MED ORDER — MIDAZOLAM HCL 2 MG/2ML IJ SOLN
INTRAMUSCULAR | Status: AC | PRN
  Administered 2015-04-05: 1 mg via INTRAVENOUS

## 2015-04-05 MED ORDER — FENTANYL CITRATE (PF) 100 MCG/2ML IJ SOLN
INTRAMUSCULAR | Status: AC
  Filled 2015-04-05: qty 2

## 2015-04-05 MED ORDER — OXYCODONE HCL 15 MG PO TABS: 15.0000 mg | ORAL_TABLET | ORAL | Status: DC | PRN

## 2015-04-05 MED ORDER — SODIUM CHLORIDE 0.9 % IV SOLN: Freq: Once | INTRAVENOUS | Status: DC

## 2015-04-05 MED ORDER — MIDAZOLAM HCL 2 MG/2ML IJ SOLN
INTRAMUSCULAR | Status: AC
  Filled 2015-04-05: qty 2

## 2015-04-05 NOTE — Progress Notes (Signed)
Barry Cook has completed 5 fractions to his C-Spine.  He continues to have pain in his neck that radiates down his right arm.  He is taking 3 tablets of oxycodone/acetaminophen q 4 hours.  He reports he also has had pains in his upper chest area and under his ribs.  He reports having heartburn after a few bites and has trouble getting food to go down.  He reports having shortness of breath when he lays down at night.  He said this has been going on for a while.  He reports having to sleep with a fan on.  He also reports having constipation.  He had a small bowel movement this morning but has not had a large one since last week.  He started taking miralax yesterday.  He reports his skin is itching.  He has been given sonafine lotion to use twice a day.  BP 93/60 mmHg  Pulse 68  Temp(Src) 97.8 F (36.6 C) (Oral)  Ht 6' (1.829 m)  Wt 155 lb 8 oz (70.534 kg)  BMI 21.08 kg/m2  SpO2 99%

## 2015-04-05 NOTE — Procedures (Signed)
Interventional Radiology Procedure Note  Procedure:  Ultrasound guided core biopsy of right cervical/supraclavicular lymph node  Complications:  None  Estimated Blood Loss: < 10 mL  18 G core biopsy x 4 of right cervical lymph node.  Barry Cook. Kathlene Cote, M.D Pager:  [phone]

## 2015-04-05 NOTE — H&P (Signed)
Chief Complaint: Patient was seen in consultation today for Right supraclavicular lymph node biopsy at the request of [facility]  Referring Physician(s): Mohamed,Mohamed  History of Present Illness: Barry Cook is a 59 y.o. male   Pt with recent diagnosis stage 4 lung cancer Metastasis to bone Noted adrenal involvement and abnormal cervical/supraclavicular lymphadenopathy +PET IMPRESSION: 1. Hypermetabolic cavitary 8.4 cm right lower lobe lung mass, in keeping with a primary bronchogenic carcinoma. 2. Hypermetabolic ipsilateral peribronchial and ipsilateral and contralateral mediastinal nodal metastases. 3. Hypermetabolic right axillary, right cervical and upper retroperitoneal nodal metastases. 4. Hypermetabolic ipsilateral right upper lobe pulmonary metastasis. Tiny left upper lobe pulmonary nodule, below PET resolution, cannot exclude contralateral pulmonary metastasis. 5. New small layering right pleural effusion and hypermetabolic pleural-based anterior right nodule, favor a pleural metastasis. 6. Hypermetabolic bilateral adrenal metastases. 7. Multifocal hypermetabolic soft tissue metastases throughout the peritoneal cavity, bilateral retroperitoneum and left gluteal musculature. 8. Multifocal hypermetabolic lytic osseous metastases in the spine and pelvis. Now scheduled for Right SCLN biopsy per Dr Julien Nordmann  Past Medical History  Diagnosis Date  . Cancer [facility])     Lung    History reviewed. No pertinent past surgical history.  Allergies: Review of patient's allergies indicates no known allergies.  Medications: Prior to Admission medications   Medication Sig Start Date End Date Taking? Authorizing Provider  oxyCODONE-acetaminophen (ROXICET) 5-325 MG tablet Take 1-2 tablets by mouth every 4 (four) hours as needed for severe pain. 03/31/15  Yes Kyung Rudd, MD  traZODone (DESYREL) 50 MG tablet Take 1 tablet (50 mg total) by mouth at bedtime as needed  and may repeat dose one time if needed for sleep. 11/11/12  Yes Niel Hummer, NP  traMADol (ULTRAM) 50 MG tablet Take 1 tablet (50 mg total) by mouth every 6 (six) hours as needed. Patient not taking: Reported on 04/04/2015 03/16/15   Curt Bears, MD     History reviewed. No pertinent family history.  Social History   Social History  . Marital Status: Married    Spouse Name: N/A  . Number of Children: N/A  . Years of Education: N/A   Social History Main Topics  . Smoking status: Current Every Day Smoker -- 1.00 packs/day    Types: Cigarettes  . Smokeless tobacco: None  . Alcohol Use: 7.2 oz/week    12 Cans of beer per week  . Drug Use: Yes    Special: Cocaine  . Sexual Activity: Yes   Other Topics Concern  . None   Social History Narrative    Review of Systems: A 12 point ROS discussed and pertinent positives are indicated in the HPI above.  All other systems are negative.  Review of Systems  Constitutional: Positive for appetite change and fatigue. Negative for fever and activity change.  Respiratory: Negative for cough and shortness of breath.   Gastrointestinal: Negative for abdominal pain.  Genitourinary: Negative for difficulty urinating.  Musculoskeletal: Negative for back pain.  Neurological: Negative for weakness.  Psychiatric/Behavioral: Negative for behavioral problems and confusion.    Vital Signs: BP 101/75 mmHg  Pulse 92  Temp(Src) 97.4 F (36.3 C)  Resp 20  Ht 6' (1.829 m)  Wt 168 lb (76.204 kg)  BMI 22.78 kg/m2  SpO2 96%  Physical Exam  Constitutional: He is oriented to person, place, and time.  Cardiovascular: Normal rate, regular rhythm and normal heart sounds.   Pulmonary/Chest: Effort normal and breath sounds normal. He has no wheezes.  Abdominal: Soft. Bowel sounds  are normal. There is no tenderness.  Musculoskeletal: Normal range of motion.  Neurological: He is alert and oriented to person, place, and time.  Skin: Skin is warm and  dry.  Psychiatric: He has a normal mood and affect. His behavior is normal. Judgment and thought content normal.  Nursing note and vitals reviewed.   Mallampati Score:  MD Evaluation Airway: WNL Heart: WNL Abdomen: WNL Chest/ Lungs: WNL ASA  Classification: 3 Mallampati/Airway Score: One  Imaging: Dg Chest 2 View  03/07/2015  CLINICAL DATA:  59 year old male with chest pain for 1 week. Destructive mass of the C6 vertebral body. Initial encounter. EXAM: CHEST  2 VIEW COMPARISON:  Neck CT from today reported separately. FINDINGS: There is a cavitary appearing mass like opacity in the anterior or medial segment of the right lower lobe measuring about 8 cm diameter. There is thickening of the right peritracheal density suggesting mediastinal lymphadenopathy. There is no associated pleural effusion or pneumothorax. Large lung volumes. Mild reticulonodular density in the apices as seen on the earlier CT. Chronic left ninth rib fracture. Degenerative changes in the spine with no destructive osseous lesion identified in the thorax radiographically. IMPRESSION: Large cavitary mass like opacity in the right lower lobe. Evidence of right peritracheal lymphadenopathy. In conjunction with the earlier neck CT findings today, favor metastatic pulmonary squamous cell carcinoma. Electronically Signed   By: Genevie Ann M.D.   On: 03/07/2015 19:16   Ct Chest W Contrast  03/16/2015  CLINICAL DATA:  New diagnosis lung cancer. EXAM: CT CHEST WITH CONTRAST TECHNIQUE: Multidetector CT imaging of the chest was performed during intravenous contrast administration. CONTRAST:  53m OMNIPAQUE IOHEXOL 300 MG/ML  SOLN COMPARISON:  03/07/2015 FINDINGS: Mediastinum/Nodes: Paratracheal, right hilar, subcarinal, and right infrahilar pathologic adenopathy. The lower right paratracheal lymph node has a short axis diameter of 2.7 cm (image 20, series 2) in the subcarinal node has a short axis diameter of 4.3 cm (Image 32, series 2).  Posterior right hilar lymph node short axis diameter 1.6 cm (image 29, series 2). Lungs/Pleura: Dominant 7.9 by 7.1 cm cavitary right lower lobe mass, thick irregular walls, confluent with the right infrahilar adenopathy. Below this there is another potential partially cavitary mass measuring 3.2 by 2.3 cm on image 54 series 5, and surrounding pneumonitis or pneumonia. 5 mm right upper lobe nodule, image 22 series 5. 3 mm nodule, left upper lobe, image 17 series 5. Underlying emphysema noted. Bilateral airway thickening is present. No pleural effusion. Upper abdomen: 6.2 by 4.4 cm right adrenal mass, internal density 26 Hounsfield units. The multilobular left adrenal mass measuring 3.7 by 2.7 cm on image 69 of series 2, internal density 44 Hounsfield units. Gastrohepatic ligament lymph node 1.4 cm on image 62 series 2. Hypodense splenic lesion concerning for possible metastasis, 1.2 cm on image 62 series 2. Irregular rim enhancing metastatic lesion in the right perirenal space, 1.7 by 1.7 cm on image 74 series 2. Musculoskeletal: Degenerative disc disease in the thoracic spine at multiple levels. IMPRESSION: 1. Cavitary 7.9 by 7.1 cm right lower lobe mass with extensive paratracheal, right hilar, subcarinal, gastrohepatic, and infrahilar adenopathy. There several additional pulmonary nodules, including a 3 mm on mild nodule in the left upper lobe, and evidence of metastatic disease to both adrenal glands, the right perirenal space, and probably the spleen. 2. There is also an adjacent potentially partially cavitary mass below the dominant cavitary mass. This smaller lesion measures 3.2 by 2.3 cm with surrounding pneumonitis or pneumonia. 3.  Emphysema and bilateral airway thickening. 4. 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 Electronically Signed   By: Van Clines M.D.   On: 03/16/2015 16:45   Ct Cervical Spine Wo Contrast  03/07/2015  CLINICAL DATA:  59 year old male with worsening  chronic cervical spine pain radiating into right arm and shoulder. EXAM: CT CERVICAL SPINE WITHOUT CONTRAST TECHNIQUE: Multidetector CT imaging of the cervical spine was performed without intravenous contrast. Multiplanar CT image reconstructions were also generated. COMPARISON:  07/27/2003 radiographs FINDINGS: A lytic mass involving the right C6 vertebral body/pedicle/articular mass is identified with nondisplaced fracture involving the C6 right articular process. This is suspicious for malignancy/metastasis. There is no evidence of acute subluxation or prevertebral soft tissue swelling. Moderate degenerative disc disease and spondylosis from C4-C7 identified contributing to mild to moderate central spinal and bony foraminal narrowing at several levels. No other bony abnormalities are identified. Mild centrilobular and paraseptal emphysema in the lung apices noted. IMPRESSION: Destructive mass involving the right C6 vertebral body/pedicle/articular mass with nondisplaced fracture involving the C6 right articular process. This is suspicious for malignancy/metastasis. MRI with and without contrast is recommended for further evaluation. Moderate degenerative changes from C4-C7 contributing to mild to moderate central spinal and bony foraminal narrowing. Electronically Signed   By: Margarette Canada M.D.   On: 03/07/2015 17:47   Mr Jeri Cos LY Contrast  03/28/2015  CLINICAL DATA:  59 year old male with stage IV lung cancer initial staging. Subsequent encounter. Destructive C6 metastasis. EXAM: MRI HEAD WITHOUT AND WITH CONTRAST TECHNIQUE: Multiplanar, multiecho pulse sequences of the brain and surrounding structures were obtained without and with intravenous contrast. CONTRAST:  58m MULTIHANCE GADOBENATE DIMEGLUMINE 529 MG/ML IV SOLN COMPARISON:  Cervical spine MRI 03/07/2015. PET-CT from today reported separately. FINDINGS: No enhancing brain lesion identified. No midline shift, mass effect, or evidence of intracranial  mass lesion. No dural thickening identified. Bone marrow signal in the skull is within normal limits. No restricted diffusion to suggest acute infarction. No ventriculomegaly, extra-axial collection or acute intracranial hemorrhage. Cervicomedullary junction and pituitary are within normal limits. Major intracranial vascular flow voids are preserved, dominant appearing distal left vertebral artery. Scattered small cerebral white matter foci of T2 and FLAIR hyperintensity in a nonspecific configuration. Extent is mild for age. Deep gray matter nuclei and brainstem are within normal limits. There is a small chronic lacunar infarct in the left cerebellum on series 6, image 7. Visible internal auditory structures appear normal. Trace mastoid fluid. Trace retained secretions in the visible pharynx. Trace paranasal sinus mucosal thickening. Orbit and scalp soft tissues are within normal limits IMPRESSION: 1.  No acute or metastatic intracranial abnormality. 2. Mild for age signal changes most compatible with chronic small vessel disease. Electronically Signed   By: HGenevie AnnM.D.   On: 03/28/2015 15:16   Mr Cervical Spine W Wo Contrast  03/08/2015  CLINICAL DATA:  Constant worsening aching burning neck pain radiating to RIGHT shoulder beginning 1 month ago. Follow-up mass. EXAM: MRI CERVICAL SPINE WITHOUT AND WITH CONTRAST TECHNIQUE: Multiplanar and multiecho pulse sequences of the cervical spine, to include the craniocervical junction and cervicothoracic junction, were obtained without and with intravenous contrast. CONTRAST:  143mMULTIHANCE GADOBENATE DIMEGLUMINE 529 MG/ML IV SOLN COMPARISON:  CT cervical spine March 07, 2015 FINDINGS: Motion degraded examination. Cervical vertebral bodies and posterior elements are intact and aligned, maintenance of the cervical lordosis. Low T1, bright T2 signal within the C6 vertebral body associated with abnormal enhancement. Abnormal bright STIR signal enhancement  extends into  the RIGHT C6 fist sec corresponding to known nondisplaced fracture. Moderate to severe C4-5, C5-6 and moderate C6-7 disc height loss, associated subacute on chronic discogenic endplate changes. Focal smooth slight dural enhancement from C4-5 to C6, without discrete extradural mass. Spinal cord is normal morphology and signal characteristics though motion degrades sensitivity for subtle potential cord signal abnormality. No syrinx. No abnormal cord enhancement. Enhancing bright STIR signal within the RIGHT paraspinal muscles from C5-C7 partially imaged. Vertebral artery flow voids present. Level by level evaluation (Axial T2 gradient sequence, axial T1 pre sequences are severely motion degraded): C2-3: No significant disc bulge. Moderate facet arthropathy without canal stenosis or neural foraminal narrowing. C3-4: Annular bulging, uncovertebral hypertrophy and mild facet arthropathy. Mild canal stenosis and moderate neural foraminal narrowing. C4-5: Moderate broad-based disc bulge, uncovertebral hypertrophy and mild facet arthropathy result in moderate canal stenosis and severe neural foraminal narrowing. C5-6: Small to moderate broad-based disc bulge, uncovertebral hypertrophy and moderate to severe facet arthropathy result in moderate canal stenosis, severe RIGHT greater than LEFT neural foraminal narrowing. Amorphous abnormal enhancement effacing the RIGHT foramen transversarium, there is delayed entry of the RIGHT vertebral artery, which enters at upper aspect of C5-6, predominately above the abnormal enhancement. C6-7: Moderate broad-based disc bulge, uncovertebral hypertrophy and mild facet arthropathy result in moderate canal stenosis and severe neural foraminal narrowing. C7-T1: No disc bulge, canal stenosis nor neural foraminal narrowing. IMPRESSION: Motion degraded examination. Abnormal enhancement within the RIGHT C6 vertebral body extending to the facet corresponding to destructive bony changes on today's  CT, differential diagnosis includes infectious/ inflammatory changes, less likely metastasis. Associated RIGHT paraspinal muscle strain versus myositis. Recommend correlation with ESR/ CRP, histopathologic sampling could be considered. Degenerative changes cervical spine resulting in moderate canal stenosis C4-5 through C6-7. Neural foraminal narrowing C3-4 thru C6-7: Severe from C4-5 through C6-7. Electronically Signed   By: Elon Alas M.D.   On: 03/08/2015 03:08   Nm Pet Image Initial (pi) Skull Base To Thigh  03/28/2015  CLINICAL DATA:  Initial treatment strategy for cavitary right lung mass seen on chest CT. Patient initially presented with neck pain and destructive lytic lesion in the right C6 pedicle on cervical spine CT. EXAM: NUCLEAR MEDICINE PET SKULL BASE TO THIGH TECHNIQUE: 7.8 mCi F-18 FDG was injected intravenously. Full-ring PET imaging was performed from the skull base to thigh after the radiotracer. CT data was obtained and used for attenuation correction and anatomic localization. FASTING BLOOD GLUCOSE:  Value: 92 mg/dl COMPARISON:  03/16/2015 chest CT. FINDINGS: NECK Hypermetabolic mildly enlarged 1.3 cm right level 3 cervical nodal metastasis (series 4/ image 47) with a max SUV 16.1. CHEST Hypermetabolic mildly enlarged 1.0 cm right axillary node (4/63) with max SUV 7.2. Bulky hypermetabolic right paratracheal, left upper mediastinal, subcarinal and lower paraesophageal mediastinal adenopathy. A representative enlarged hypermetabolic 3.5 cm right paratracheal node (4/73) demonstrates max SUV 20.2. A mildly enlarged hypermetabolic 1.2 cm left upper mediastinal prevascular node (4/65) demonstrates max SUV 10.6. A bulky partially necrotic 4.5 cm hypermetabolic subcarinal node (4/91) demonstrates max SUV 17.1. An enlarged 1.9 cm hypermetabolic right paraesophageal lower mediastinal node (4/100) demonstrates max SUV 16.4. There is a hypermetabolic cavitary 8.4 x 8.0 cm right lower lobe lung  mass (series 6/image 55) with max SUV 26.7, in keeping with a primary bronchogenic carcinoma. There is patchy ground-glass opacity surrounding the right lower lobe lung mass, in keeping with postobstructive pneumonitis/ atelectasis. There is an FDG avid 0.7 cm right upper lobe pulmonary nodule (  6/27) with max SUV 2.1, in keeping with an ipsilateral pulmonary metastasis. There is a 4 mm left upper lobe pulmonary nodule (6/23), below PET resolution. There is hypermetabolic ipsilateral peribronchial metastatic adenopathy, for example a 2.1 cm right peribronchial hypermetabolic node (1/61) with max SUV 13.4. There is a new small layering right pleural effusion. There is a pleural-based hypermetabolic 1.6 cm nodule in the anterior right hemithorax (6/36), favor a pleural metastasis. There is minimal pericardial fluid/thickening. There is atherosclerosis of the thoracic aorta, the great vessels of the mediastinum and the coronary arteries, including calcified atherosclerotic plaque in the left main, left anterior descending and left circumflex coronary arteries. ABDOMEN/PELVIS There is a hypermetabolic 1.9 cm hypodense splenic nodule (4/118) with max SUV 6.4, in keeping with a splenic metastasis. There are hypermetabolic bilateral adrenal masses in keeping with bilateral adrenal metastases, measuring 6.0 cm on the right (4/123) with max SUV 14.9 and 3.0 cm on the left (4/124) with max SUV 14.1. There is a hypermetabolic 1.9 cm gastrohepatic ligament nodal metastasis (4/124) with max SUV 22.5. There are multiple bilateral hypermetabolic scattered peritoneal and retroperitoneal soft tissue metastases. For example a hypermetabolic 2.0 cm right retroperitoneal metastasis adjacent to the lateral interpolar right kidney (4/145) with max SUV 10.6 and a hypermetabolic 2.2 cm anterior midline peritoneal cavity metastasis (4/147) with max SUV 17.5. There is a 1.7 cm hypermetabolic soft tissue metastasis within the left gluteus  maximus muscle (4/177) with max SUV 12.4. No abnormal hypermetabolic activity within the liver or pancreas. Atherosclerotic nonaneurysmal abdominal aorta. Trace pelvic ascites. Moderate prostatomegaly. SKELETON There are multifocal hypermetabolic osseous metastases in the cervical spine, thoracic spine, sacrum and right posterior acetabulum. For example, a lytic right C6 posterior element metastasis demonstrates a max SUV of 22.3, a left posterior T9 vertebral body lytic metastasis demonstrates max SUV 10.9, a midline S1 lytic osseous metastasis demonstrates max SUV 11.6 and a right posterior acetabular lytic osseous metastasis demonstrates max SUV 11.7. IMPRESSION: 1. Hypermetabolic cavitary 8.4 cm right lower lobe lung mass, in keeping with a primary bronchogenic carcinoma. 2. Hypermetabolic ipsilateral peribronchial and ipsilateral and contralateral mediastinal nodal metastases. 3. Hypermetabolic right axillary, right cervical and upper retroperitoneal nodal metastases. 4. Hypermetabolic ipsilateral right upper lobe pulmonary metastasis. Tiny left upper lobe pulmonary nodule, below PET resolution, cannot exclude contralateral pulmonary metastasis. 5. New small layering right pleural effusion and hypermetabolic pleural-based anterior right nodule, favor a pleural metastasis. 6. Hypermetabolic bilateral adrenal metastases. 7. Multifocal hypermetabolic soft tissue metastases throughout the peritoneal cavity, bilateral retroperitoneum and left gluteal musculature. 8. Multifocal hypermetabolic lytic osseous metastases in the spine and pelvis. Electronically Signed   By: Ilona Sorrel M.D.   On: 03/28/2015 14:13    Labs:  CBC:  Recent Labs  03/07/15 2152 03/27/15 0757  WBC  --  7.5  HGB 13.9 13.7  HCT 41.0 41.6  PLT  --  207    COAGS: No results for input(s): INR, APTT in the last 8760 hours.  BMP:  Recent Labs  03/07/15 2152 03/27/15 0757  NA 138 136  K 3.9 4.1  CL 102  --   CO2  --  26    GLUCOSE 96 100  BUN 4* 8.2  CALCIUM  --  9.2  CREATININE 0.70 0.7    LIVER FUNCTION TESTS:  Recent Labs  03/27/15 0757  BILITOT 0.40  AST 11  ALT <9  ALKPHOS 75  PROT 6.4  ALBUMIN 2.9*    TUMOR MARKERS: No results for input(s): AFPTM,  CEA, CA199, CHROMGRNA in the last 8760 hours.  Assessment and Plan:  Stage 4 lung cancer Bony metastasis Adrenal and LAN involvement Now scheduled for Right supraclavicular lymph node biopsy Risks and Benefits discussed with the patient including, but not limited to bleeding, infection, damage to adjacent structures or low yield requiring additional tests. All of the patient's questions were answered, patient is agreeable to proceed. Consent signed and in chart.     Thank you for this interesting consult.  I greatly enjoyed meeting PERLIE SCHEURING and look forward to participating in their care.  A copy of this report was sent to the requesting provider on this date.  Signed: Carlee Vonderhaar A 04/05/2015, 9:12 AM   I spent a total of  30 Minutes   in face to face in clinical consultation, greater than 50% of which was counseling/coordinating care for R SCLN bx

## 2015-04-05 NOTE — Discharge Instructions (Signed)
Needle Biopsy, Care After °These instructions give you information about caring for yourself after your procedure. Your doctor may also give you more specific instructions. Call your doctor if you have any problems or questions after your procedure. °HOME CARE °· Rest as told by your doctor. °· Take medicines only as told by your doctor. °· There are many different ways to close and cover the biopsy site, including stitches (sutures), skin glue, and adhesive strips. Follow instructions from your doctor about: °¨ How to take care of your biopsy site. °¨ When and how you should change your bandage (dressing). °¨ When you should remove your dressing. °¨ Removing whatever was used to close your biopsy site. °· Check your biopsy site every day for signs of infection. Watch for: °¨ Redness, swelling, or pain. °¨ Fluid, blood, or pus. °GET HELP IF: °· You have a fever. °· You have redness, swelling, or pain at the biopsy site, and it lasts longer than a few days. °· You have fluid, blood, or pus coming from the biopsy site. °· You feel sick to your stomach (nauseous). °· You throw up (vomit). °GET HELP RIGHT AWAY IF: °· You are short of breath. °· You have trouble breathing. °· Your chest hurts. °· You feel dizzy or you pass out (faint). °· You have bleeding that does not stop with pressure or a bandage. °· You cough up blood. °· Your belly (abdomen) hurts. °  °This information is not intended to replace advice given to you by your health care provider. Make sure you discuss any questions you have with your health care provider. °  °Document Released: 04/25/2008 Document Revised: 09/27/2014 Document Reviewed: 05/09/2014 °Elsevier Interactive Patient Education ©2016 [employer] ° °

## 2015-04-05 NOTE — Progress Notes (Signed)
  Radiation Oncology         [phone] ________________________________  Name: Barry Cook MRN: [id number]  Date: 04/05/2015  DOB: 23-Dec-1955  Weekly Radiation Therapy Management    ICD-9-CM ICD-10-CM   1. 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 . . . . . . . The patient presents for routine under treatment assessment.                                   The patient is without complaint.                                 Set-up films were reviewed.                                 The chart was checked.   Barry Cook has completed 5 fractions to his C-Spine. He continues to have pain in his neck that radiates down his right arm. He is taking 3 tablets of oxycodone/acetaminophen q 4 hours. He reports he also has had pains in his upper chest area and under his ribs. He reports having heartburn after a few bites and has trouble getting food to go down. He reports having shortness of breath when he lays down at night. He said this has been going on for a while. He reports having to sleep with a fan on. He also reports having constipation. He had a small bowel movement this morning but has not had a large one since last week. He started taking miralax yesterday. He reports his skin is itching. He has been given sonafine lotion to use twice a day.   Physical Findings. . .  height is 6' (1.829 m) and weight is 155 lb 8 oz (70.534 kg). His oral temperature is 97.8 F (36.6 C). His blood pressure is 93/60 and his pulse is 68. His oxygen saturation is 99%. . Weight essentially stable.  No significant changes. Lungs had congestion but clear upon cough. Impression . . . . . . . The patient is tolerating radiation. No improvement in pain thus far Plan . . . . . . . . . . . . Continue treatment as planned. Patient is out of his Percocet. I have given him prescription for  oxycodone 15 mg to use so that he can avoid excessive amounts of acetaminophen.   _____________________________________   Blair Promise, PhD, MD   This document serves as a record of services personally performed by Gery Pray, MD. It was created on his behalf by Derek Mound, a trained medical scribe. The creation of this record is based on the scribe's personal observations and the provider's statements to them. This document has been checked and approved by the attending provider.

## 2015-04-06 ENCOUNTER — Other Ambulatory Visit: Payer: Self-pay | Admitting: Oncology

## 2015-04-06 ENCOUNTER — Ambulatory Visit: Payer: Medicaid Other

## 2015-04-07 ENCOUNTER — Ambulatory Visit: Payer: Medicaid Other

## 2015-04-07 DIAGNOSIS — Z510 Encounter for antineoplastic radiation therapy: Secondary | ICD-10-CM | POA: Diagnosis not present

## 2015-04-10 ENCOUNTER — Ambulatory Visit: Payer: Medicaid Other

## 2015-04-10 DIAGNOSIS — Z510 Encounter for antineoplastic radiation therapy: Secondary | ICD-10-CM | POA: Diagnosis not present

## 2015-04-11 ENCOUNTER — Ambulatory Visit: Payer: Medicaid Other

## 2015-04-11 ENCOUNTER — Telehealth: Payer: Self-pay | Admitting: Oncology

## 2015-04-11 ENCOUNTER — Ambulatory Visit: Payer: Medicaid Other | Admitting: Radiation Oncology

## 2015-04-11 NOTE — Telephone Encounter (Signed)
Called [name] and spoke to his wife, Barry Cook.  Barry Cook had to cancel treatment today due to work issues.

## 2015-04-12 ENCOUNTER — Ambulatory Visit: Payer: Medicaid Other

## 2015-04-12 DIAGNOSIS — Z510 Encounter for antineoplastic radiation therapy: Secondary | ICD-10-CM | POA: Diagnosis not present

## 2015-04-13 ENCOUNTER — Ambulatory Visit
Admission: RE | Admit: 2015-04-13 | Discharge: 2015-04-13 | Disposition: A | Discharge status: Home / Self Care | Payer: Medicaid Other | Source: Ambulatory Visit | Attending: Radiation Oncology | Admitting: Radiation Oncology

## 2015-04-13 ENCOUNTER — Ambulatory Visit (HOSPITAL_COMMUNITY): Payer: Self-pay

## 2015-04-13 DIAGNOSIS — Z510 Encounter for antineoplastic radiation therapy: Secondary | ICD-10-CM | POA: Diagnosis not present

## 2015-04-14 ENCOUNTER — Ambulatory Visit
Admission: RE | Admit: 2015-04-14 | Discharge: 2015-04-14 | Disposition: A | Discharge status: Home / Self Care | Payer: Medicaid Other | Source: Ambulatory Visit | Attending: Radiation Oncology | Admitting: Radiation Oncology

## 2015-04-14 ENCOUNTER — Encounter (HOSPITAL_COMMUNITY): Payer: Self-pay

## 2015-04-14 ENCOUNTER — Encounter: Payer: Self-pay | Admitting: Radiation Oncology

## 2015-04-14 DIAGNOSIS — Z510 Encounter for antineoplastic radiation therapy: Secondary | ICD-10-CM | POA: Diagnosis not present

## 2015-04-17 ENCOUNTER — Encounter: Payer: Self-pay | Admitting: Internal Medicine

## 2015-04-17 ENCOUNTER — Ambulatory Visit (HOSPITAL_BASED_OUTPATIENT_CLINIC_OR_DEPARTMENT_OTHER): Payer: Medicaid Other | Admitting: Internal Medicine

## 2015-04-17 ENCOUNTER — Telehealth: Payer: Self-pay | Admitting: Internal Medicine

## 2015-04-17 ENCOUNTER — Encounter (HOSPITAL_COMMUNITY): Payer: Self-pay

## 2015-04-17 VITALS — BP 130/95 | HR 80 | Temp 98.0°F | Resp 18 | Ht 72.0 in | Wt 152.7 lb

## 2015-04-17 DIAGNOSIS — C7970 Secondary malignant neoplasm of unspecified adrenal gland: Secondary | ICD-10-CM | POA: Diagnosis not present

## 2015-04-17 DIAGNOSIS — C3431 Malignant neoplasm of lower lobe, right bronchus or lung: Secondary | ICD-10-CM

## 2015-04-17 DIAGNOSIS — C3430 Malignant neoplasm of lower lobe, unspecified bronchus or lung: Secondary | ICD-10-CM | POA: Insufficient documentation

## 2015-04-17 NOTE — Progress Notes (Signed)
[facility] [phone]   [phone]  OFFICE PROGRESS NOTE  No PCP Per Patient No address on file  DIAGNOSIS: Stage IV (T3, N3,M1b) non-small cell lung cancer, poorly differentiated carcinoma, PDL 1 expression 60% diagnosed in November 2016. It presented with large cavitary right lower lobe lung mass in addition to ipsilateral and contralateral mediastinal lymphadenopathy as well as right axillary, right cervical and upper retroperitoneal nodal metastases in addition to metastatic bone disease and bilateral adrenal metastasis.  PRIOR THERAPY: Palliative radiotherapy to the metastatic bone lesion in the cervical spine.  CURRENT THERAPY: Ketruda (pembrolizumab) 200 mg IV every 3 weeks. First dose 04/24/2015  INTERVAL HISTORY: Barry Cook 59 y.o. male returns to the clinic today for follow-up visit accompanied by his wife. The patient recently completed a course of palliative radiotherapy to the metastatic bone lesion in the cervical spine under the care of Dr. Sondra Come. Has some sore throat as well as pain in that area. His pain is rated as 4 on a scale from 1-10 with the current pain medication. He denied having any significant chest pain but has shortness breath with exertion with mild cough and no hemoptysis. He denied having any significant weight loss or night sweats. The patient has no nausea or vomiting. He had imaging studies performed recently including MRI of the brain that showed no evidence for metastatic disease to the brain. His PET scan showed extensive metastatic disease in the lung, mediastinal lymph nodes, and axillary lymph nodes as well as adrenal gland and metastatic bone lesions. The patient is here today for evaluation and discussion of his treatment options.  MEDICAL HISTORY: Past Medical History  Diagnosis Date  . Cancer ([place])     Lung    ALLERGIES:  has No Known Allergies.  MEDICATIONS:  Current Outpatient Prescriptions    Medication Sig Dispense Refill  . oxyCODONE (ROXICODONE) 15 MG immediate release tablet Take 1 tablet (15 mg total) by mouth every 4 (four) hours as needed for pain. 60 tablet 0  . oxyCODONE-acetaminophen (ROXICET) 5-325 MG tablet Take 1-2 tablets by mouth every 4 (four) hours as needed for severe pain. 40 tablet 0  . polyethylene glycol (MIRALAX / GLYCOLAX) packet Take 17 g by mouth 2 (two) times daily.    . traZODone (DESYREL) 50 MG tablet Take 1 tablet (50 mg total) by mouth at bedtime as needed and may repeat dose one time if needed for sleep. 60 tablet 0   No current facility-administered medications for this visit.    SURGICAL HISTORY: No past surgical history on file.  REVIEW OF SYSTEMS:  Constitutional: negative Eyes: negative Ears, nose, mouth, throat, and face: positive for sore throat Respiratory: positive for cough and dyspnea on exertion Cardiovascular: negative Gastrointestinal: negative Genitourinary:negative Integument/breast: negative Hematologic/lymphatic: negative Musculoskeletal:positive for bone pain Neurological: negative Behavioral/Psych: negative Endocrine: negative Allergic/Immunologic: negative   PHYSICAL EXAMINATION: General appearance: alert, cooperative and no distress Head: Normocephalic, without obvious abnormality, atraumatic Neck: no adenopathy, no JVD, supple, symmetrical, trachea midline and thyroid not enlarged, symmetric, no tenderness/mass/nodules Lymph nodes: Cervical, supraclavicular, and axillary nodes normal. Resp: clear to auscultation bilaterally Back: symmetric, no curvature. ROM normal. No CVA tenderness. Cardio: regular rate and rhythm, S1, S2 normal, no murmur, click, rub or gallop GI: soft, non-tender; bowel sounds normal; no masses,  no organomegaly Extremities: extremities normal, atraumatic, no cyanosis or edema Neurologic: Alert and oriented X 3, normal strength and tone. Normal symmetric reflexes. Normal coordination and  gait  ECOG PERFORMANCE STATUS: 1 - Symptomatic but completely ambulatory  Blood pressure 130/95, pulse 80, temperature 98 F (36.7 C), temperature source Oral, resp. rate 18, height 6' (1.829 m), weight 152 lb 11.2 oz (69.264 kg), SpO2 99 %.  LABORATORY DATA: Lab Results  Component Value Date   WBC 5.9 04/05/2015   HGB 12.1* 04/05/2015   HCT 37.2* 04/05/2015   MCV 91.9 04/05/2015   PLT 178 04/05/2015      Chemistry      Component Value Date/Time   NA 136 03/27/2015 0757   NA 138 03/07/2015 2152   K 4.1 03/27/2015 0757   K 3.9 03/07/2015 2152   CL 102 03/07/2015 2152   CO2 26 03/27/2015 0757   CO2 27 11/07/2012 0712   BUN 8.2 03/27/2015 0757   BUN 4* 03/07/2015 2152   CREATININE 0.7 03/27/2015 0757   CREATININE 0.70 03/07/2015 2152      Component Value Date/Time   CALCIUM 9.2 03/27/2015 0757   CALCIUM 8.8 11/07/2012 0712   ALKPHOS 75 03/27/2015 0757   ALKPHOS 66 11/07/2012 0712   AST 11 03/27/2015 0757   AST 20 11/07/2012 0712   ALT <9 03/27/2015 0757   ALT 14 11/07/2012 0712   BILITOT 0.40 03/27/2015 0757   BILITOT 0.8 11/07/2012 3220       RADIOGRAPHIC STUDIES: Mr Jeri Cos Wo Contrast  04-02-2015  CLINICAL DATA:  59 year old male with stage IV lung cancer initial staging. Subsequent encounter. Destructive C6 metastasis. EXAM: MRI HEAD WITHOUT AND WITH CONTRAST TECHNIQUE: Multiplanar, multiecho pulse sequences of the brain and surrounding structures were obtained without and with intravenous contrast. CONTRAST:  11m MULTIHANCE GADOBENATE DIMEGLUMINE 529 MG/ML IV SOLN COMPARISON:  Cervical spine MRI 03/07/2015. PET-CT from today reported separately. FINDINGS: No enhancing brain lesion identified. No midline shift, mass effect, or evidence of intracranial mass lesion. No dural thickening identified. Bone marrow signal in the skull is within normal limits. No restricted diffusion to suggest acute infarction. No ventriculomegaly, extra-axial collection or acute  intracranial hemorrhage. Cervicomedullary junction and pituitary are within normal limits. Major intracranial vascular flow voids are preserved, dominant appearing distal left vertebral artery. Scattered small cerebral white matter foci of T2 and FLAIR hyperintensity in a nonspecific configuration. Extent is mild for age. Deep gray matter nuclei and brainstem are within normal limits. There is a small chronic lacunar infarct in the left cerebellum on series 6, image 7. Visible internal auditory structures appear normal. Trace mastoid fluid. Trace retained secretions in the visible pharynx. Trace paranasal sinus mucosal thickening. Orbit and scalp soft tissues are within normal limits IMPRESSION: 1.  No acute or metastatic intracranial abnormality. 2. Mild for age signal changes most compatible with chronic small vessel disease. Electronically Signed   By: HGenevie AnnM.D.   On: 1November 06, 201615:16   Nm Pet Image Initial (pi) Skull Base To Thigh  111-06-16 CLINICAL DATA:  Initial treatment strategy for cavitary right lung mass seen on chest CT. Patient initially presented with neck pain and destructive lytic lesion in the right C6 pedicle on cervical spine CT. EXAM: NUCLEAR MEDICINE PET SKULL BASE TO THIGH TECHNIQUE: 7.8 mCi F-18 FDG was injected intravenously. Full-ring PET imaging was performed from the skull base to thigh after the radiotracer. CT data was obtained and used for attenuation correction and anatomic localization. FASTING BLOOD GLUCOSE:  Value: 92 mg/dl COMPARISON:  03/16/2015 chest CT. FINDINGS: NECK Hypermetabolic mildly enlarged 1.3 cm right level 3 cervical nodal metastasis (series 4/  image 47) with a max SUV 16.1. CHEST Hypermetabolic mildly enlarged 1.0 cm right axillary node (4/63) with max SUV 7.2. Bulky hypermetabolic right paratracheal, left upper mediastinal, subcarinal and lower paraesophageal mediastinal adenopathy. A representative enlarged hypermetabolic 3.5 cm right paratracheal node  (4/73) demonstrates max SUV 20.2. A mildly enlarged hypermetabolic 1.2 cm left upper mediastinal prevascular node (4/65) demonstrates max SUV 10.6. A bulky partially necrotic 4.5 cm hypermetabolic subcarinal node (4/91) demonstrates max SUV 17.1. An enlarged 1.9 cm hypermetabolic right paraesophageal lower mediastinal node (4/100) demonstrates max SUV 16.4. There is a hypermetabolic cavitary 8.4 x 8.0 cm right lower lobe lung mass (series 6/image 55) with max SUV 26.7, in keeping with a primary bronchogenic carcinoma. There is patchy ground-glass opacity surrounding the right lower lobe lung mass, in keeping with postobstructive pneumonitis/ atelectasis. There is an FDG avid 0.7 cm right upper lobe pulmonary nodule (6/27) with max SUV 2.1, in keeping with an ipsilateral pulmonary metastasis. There is a 4 mm left upper lobe pulmonary nodule (6/23), below PET resolution. There is hypermetabolic ipsilateral peribronchial metastatic adenopathy, for example a 2.1 cm right peribronchial hypermetabolic node (0/35) with max SUV 13.4. There is a new small layering right pleural effusion. There is a pleural-based hypermetabolic 1.6 cm nodule in the anterior right hemithorax (6/36), favor a pleural metastasis. There is minimal pericardial fluid/thickening. There is atherosclerosis of the thoracic aorta, the great vessels of the mediastinum and the coronary arteries, including calcified atherosclerotic plaque in the left main, left anterior descending and left circumflex coronary arteries. ABDOMEN/PELVIS There is a hypermetabolic 1.9 cm hypodense splenic nodule (4/118) with max SUV 6.4, in keeping with a splenic metastasis. There are hypermetabolic bilateral adrenal masses in keeping with bilateral adrenal metastases, measuring 6.0 cm on the right (4/123) with max SUV 14.9 and 3.0 cm on the left (4/124) with max SUV 14.1. There is a hypermetabolic 1.9 cm gastrohepatic ligament nodal metastasis (4/124) with max SUV 22.5. There  are multiple bilateral hypermetabolic scattered peritoneal and retroperitoneal soft tissue metastases. For example a hypermetabolic 2.0 cm right retroperitoneal metastasis adjacent to the lateral interpolar right kidney (4/145) with max SUV 10.6 and a hypermetabolic 2.2 cm anterior midline peritoneal cavity metastasis (4/147) with max SUV 17.5. There is a 1.7 cm hypermetabolic soft tissue metastasis within the left gluteus maximus muscle (4/177) with max SUV 12.4. No abnormal hypermetabolic activity within the liver or pancreas. Atherosclerotic nonaneurysmal abdominal aorta. Trace pelvic ascites. Moderate prostatomegaly. SKELETON There are multifocal hypermetabolic osseous metastases in the cervical spine, thoracic spine, sacrum and right posterior acetabulum. For example, a lytic right C6 posterior element metastasis demonstrates a max SUV of 22.3, a left posterior T9 vertebral body lytic metastasis demonstrates max SUV 10.9, a midline S1 lytic osseous metastasis demonstrates max SUV 11.6 and a right posterior acetabular lytic osseous metastasis demonstrates max SUV 11.7. IMPRESSION: 1. Hypermetabolic cavitary 8.4 cm right lower lobe lung mass, in keeping with a primary bronchogenic carcinoma. 2. Hypermetabolic ipsilateral peribronchial and ipsilateral and contralateral mediastinal nodal metastases. 3. Hypermetabolic right axillary, right cervical and upper retroperitoneal nodal metastases. 4. Hypermetabolic ipsilateral right upper lobe pulmonary metastasis. Tiny left upper lobe pulmonary nodule, below PET resolution, cannot exclude contralateral pulmonary metastasis. 5. New small layering right pleural effusion and hypermetabolic pleural-based anterior right nodule, favor a pleural metastasis. 6. Hypermetabolic bilateral adrenal metastases. 7. Multifocal hypermetabolic soft tissue metastases throughout the peritoneal cavity, bilateral retroperitoneum and left gluteal musculature. 8. Multifocal hypermetabolic lytic  osseous metastases in the spine and pelvis.  Electronically Signed   By: Ilona Sorrel M.D.   On: 03/28/2015 14:13   US Biopsy  04/05/2015  CLINICAL DATA:  Hypermetabolic and cavitary right lower lobe lung mass with PET scan revealing multiple hypermetabolic lymph node metastases including a a right lower cervical/ supraclavicular lymph node. Pleural, bone and adrenal metastases are also present. The patient presents for ultrasound-guided biopsy of the right supraclavicular lymph node. EXAM: ULTRASOUND GUIDED CORE BIOPSY OF RIGHT SUPRACLAVICULAR LYMPH NODE MEDICATIONS: 2.0 mg IV Versed; 50 mcg IV Fentanyl Total Moderate Sedation Time: 15 minutes. PROCEDURE: The procedure, risks, benefits, and alternatives were explained to the patient. Questions regarding the procedure were encouraged and answered. The patient understands and consents to the procedure. A time-out was performed prior to the procedure. The right neck was prepped with Betadine in a sterile fashion, and a sterile drape was applied covering the operative field. A sterile gown and sterile gloves were used for the procedure. Local anesthesia was provided with 1% Lidocaine. Ultrasound was performed of the right neck. After localizing a supraclavicular lymph node for biopsy, a total of four 18 gauge core biopsy samples were obtained through the node. Material was submitted in saline. COMPLICATIONS: None. FINDINGS: Enlarged lymph node in the right supraclavicular neck measures approximately 2.0 x 1.5 x 1.7 cm and corresponds to the hypermetabolic lymph nodes seen by PET scan. Solid tissue was obtained. IMPRESSION: Ultrasound-guided core biopsy performed of the hypermetabolic and enlarged right supraclavicular lymph node. This lymph node measured 2 cm in greatest dimensions by ultrasound. Electronically Signed   By: Aletta Edouard M.D.   On: 04/05/2015 11:41    ASSESSMENT AND PLAN: This is a very pleasant 59 years old white male recently diagnosed with a  stage IV (T3, N3, M1 B) poorly differentiated carcinoma with positive PDL 1 expression (60%) diagnosed in November 2016 status post palliative radiotherapy to metastatic cervical bone lesion. I had a lengthy discussion with the patient and his wife today about his current disease stage, prognosis and treatment options. I gave the patient the option of palliative care and hospice referral versus consideration of treatment with systemic chemotherapy versus treatment with first-line immunotherapy with Nat Math which was recently approved in first-line therapy for patient with PDL 1expression over 50%. After discussion of the adverse effect of these options, the patient would like to proceed with treatment with immunotherapy. He will be treated with Ketruda 200 mg IV every 3 weeks. He is expected to start the first dose of this treatment on 04/24/2015. I discussed with the patient adverse effect of this treatment including immune mediated skin rash, diarrhea, liver, renal, thyroid or other endocrine dysfunction as well as pneumonitis. The patient would have a chemotherapy education class before starting the first dose of his treatment. He would come back for follow-up visit in one month's with the start of cycle #2. The patient was advised to call immediately if he has any concerning symptoms in the interval. The patient voices understanding of current disease status and treatment options and is in agreement with the current care plan.  All questions were answered. The patient knows to call the clinic with any problems, questions or concerns. We can certainly see the patient much sooner if necessary.  I spent 20 minutes counseling the patient face to face. The total time spent in the appointment was 30 minutes.  Disclaimer: This note was dictated with voice recognition software. Similar sounding words can inadvertently be transcribed and may not be corrected upon review.

## 2015-04-17 NOTE — Telephone Encounter (Signed)
Gave and printd appt sched and avs for pt for NOV thru Jan 2017

## 2015-04-23 NOTE — Progress Notes (Signed)
  Radiation Oncology         [phone] ________________________________  Name: THEODIS KINSEL MRN: [id number]  Date: 04/14/2015  DOB: Dec 22, 1955  End of Treatment Note  Diagnosis:   ICD-9-CM ICD-10-CM     1. Lung mass 786.6 R91.8    DIAGNOSIS:  Stage IV Lung Cancer with Cervical Spine Metastasis     Indication for treatment:  painful osseous metastasis in the cervical spine       Radiation treatment dates:   03/30/2015-04/14/2015  Site/dose:   Cervical spine area, 30 Gy in 10 fx's  Beams/energy:   3-D conformal 15x, 6x  Narrative: The patient tolerated radiation treatment relatively well.   Some pharyngitis,minlmal pain improvement at completion of tx.  Plan: The patient has completed radiation treatment. The patient will return to radiation oncology clinic for routine followup in one month. I advised them to call or return sooner if they have any questions or concerns related to their recovery or treatment.  -----------------------------------  Blair Promise, PhD, MD

## 2015-04-24 ENCOUNTER — Ambulatory Visit: Payer: Federal, State, Local not specified - Other

## 2015-04-24 ENCOUNTER — Other Ambulatory Visit (HOSPITAL_BASED_OUTPATIENT_CLINIC_OR_DEPARTMENT_OTHER): Payer: Medicaid Other

## 2015-04-24 ENCOUNTER — Other Ambulatory Visit: Payer: Self-pay

## 2015-04-24 VITALS — BP 112/62 | HR 62 | Resp 16

## 2015-04-24 DIAGNOSIS — R918 Other nonspecific abnormal finding of lung field: Secondary | ICD-10-CM

## 2015-04-24 DIAGNOSIS — Z79899 Other long term (current) drug therapy: Secondary | ICD-10-CM | POA: Diagnosis not present

## 2015-04-24 DIAGNOSIS — C3431 Malignant neoplasm of lower lobe, right bronchus or lung: Secondary | ICD-10-CM

## 2015-04-24 DIAGNOSIS — C7970 Secondary malignant neoplasm of unspecified adrenal gland: Secondary | ICD-10-CM | POA: Diagnosis not present

## 2015-04-24 DIAGNOSIS — Z5112 Encounter for antineoplastic immunotherapy: Secondary | ICD-10-CM | POA: Diagnosis not present

## 2015-04-24 LAB — COMPREHENSIVE METABOLIC PANEL (CC13)
ALK PHOS: 64 U/L (ref 40–150)
ALT: 9 U/L (ref 0–55)
ANION GAP: 10 meq/L (ref 3–11)
AST: 12 U/L (ref 5–34)
Albumin: 3.1 g/dL — ABNORMAL LOW (ref 3.5–5.0)
BILIRUBIN TOTAL: 0.49 mg/dL (ref 0.20–1.20)
BUN: 16.5 mg/dL (ref 7.0–26.0)
CALCIUM: 9.8 mg/dL (ref 8.4–10.4)
CO2: 25 meq/L (ref 22–29)
Chloride: 102 mEq/L (ref 98–109)
Creatinine: 0.8 mg/dL (ref 0.7–1.3)
Glucose: 104 mg/dl (ref 70–140)
Potassium: 4.2 mEq/L (ref 3.5–5.1)
Sodium: 137 mEq/L (ref 136–145)
TOTAL PROTEIN: 7.3 g/dL (ref 6.4–8.3)

## 2015-04-24 LAB — CBC WITH DIFFERENTIAL/PLATELET
BASO%: 0.2 % (ref 0.0–2.0)
Basophils Absolute: 0 10*3/uL (ref 0.0–0.1)
EOS ABS: 0.1 10*3/uL (ref 0.0–0.5)
EOS%: 1.6 % (ref 0.0–7.0)
HEMATOCRIT: 38.1 % — AB (ref 38.4–49.9)
HGB: 12.3 g/dL — ABNORMAL LOW (ref 13.0–17.1)
LYMPH#: 1.6 10*3/uL (ref 0.9–3.3)
LYMPH%: 24 % (ref 14.0–49.0)
MCH: 29.4 pg (ref 27.2–33.4)
MCHC: 32.3 g/dL (ref 32.0–36.0)
MCV: 91.1 fL (ref 79.3–98.0)
MONO#: 0.5 10*3/uL (ref 0.1–0.9)
MONO%: 8.1 % (ref 0.0–14.0)
NEUT%: 66.1 % (ref 39.0–75.0)
NEUTROS ABS: 4.3 10*3/uL (ref 1.5–6.5)
PLATELETS: 185 10*3/uL (ref 140–400)
RBC: 4.18 10*6/uL — AB (ref 4.20–5.82)
RDW: 12.6 % (ref 11.0–14.6)
WBC: 6.5 10*3/uL (ref 4.0–10.3)

## 2015-04-24 LAB — TSH CHCC: TSH: 1.032 m[IU]/L (ref 0.320–4.118)

## 2015-04-24 MED ORDER — SODIUM CHLORIDE 0.9 % IV SOLN
200.0000 mg | Freq: Once | INTRAVENOUS | Status: AC
  Administered 2015-04-24: 200 mg via INTRAVENOUS
  Filled 2015-04-24: qty 8

## 2015-04-24 MED ORDER — SODIUM CHLORIDE 0.9 % IV SOLN
Freq: Once | INTRAVENOUS | Status: AC
  Administered 2015-04-24: 13:00:00 via INTRAVENOUS

## 2015-04-24 NOTE — Patient Instructions (Addendum)
Pembrolizumab injection What is this medicine? PEMBROLIZUMAB (pem broe liz ue mab) is a monoclonal antibody. It is used to treat melanoma and non-small cell lung cancer. This medicine may be used for other purposes; ask your health care provider or pharmacist if you have questions. What should I tell my health care provider before I take this medicine? They need to know if you have any of these conditions: -diabetes -immune system problems -inflammatory bowel disease -liver disease -lung or breathing disease -lupus -an unusual or allergic reaction to pembrolizumab, other medicines, foods, dyes, or preservatives -pregnant or trying to get pregnant -breast-feeding How should I use this medicine? This medicine is for infusion into a vein. It is given by a health care professional in a hospital or clinic setting. A special MedGuide will be given to you before each treatment. Be sure to read this information carefully each time. Talk to your pediatrician regarding the use of this medicine in children. Special care may be needed. Overdosage: If you think you have taken too much of this medicine contact a poison control center or emergency room at once. NOTE: This medicine is only for you. Do not share this medicine with others. What if I miss a dose? It is important not to miss your dose. Call your doctor or health care professional if you are unable to keep an appointment. What may interact with this medicine? Interactions have not been studied. Give your health care provider a list of all the medicines, herbs, non-prescription drugs, or dietary supplements you use. Also tell them if you smoke, drink alcohol, or use illegal drugs. Some items may interact with your medicine. This list may not describe all possible interactions. Give your health care provider a list of all the medicines, herbs, non-prescription drugs, or dietary supplements you use. Also tell them if you smoke, drink alcohol,  or use illegal drugs. Some items may interact with your medicine. What should I watch for while using this medicine? Your condition will be monitored carefully while you are receiving this medicine. You may need blood work done while you are taking this medicine. Do not become pregnant while taking this medicine or for 4 months after stopping it. Women should inform their doctor if they wish to become pregnant or think they might be pregnant. There is a potential for serious side effects to an unborn child. Talk to your health care professional or pharmacist for more information. Do not breast-feed an infant while taking this medicine or for 4 months after the last dose. What side effects may I notice from receiving this medicine? Side effects that you should report to your doctor or health care professional as soon as possible: -allergic reactions like skin rash, itching or hives, swelling of the face, lips, or tongue -bloody or black, tarry stools -breathing problems -change in the amount of urine -changes in vision -chest pain -chills -dark urine -dizziness or feeling faint or lightheaded -fast or irregular heartbeat -fever -flushing -hair loss -muscle pain -muscle weakness -persistent headache -signs and symptoms of high blood sugar such as dizziness; dry mouth; dry skin; fruity breath; nausea; stomach pain; increased hunger or thirst; increased urination -signs and symptoms of liver injury like dark urine, light-colored stools, loss of appetite, nausea, right upper belly pain, yellowing of the eyes or skin -stomach pain -weight loss Side effects that usually do not require medical attention (Report these to your doctor or health care professional if they continue or are bothersome.):constipation -cough -diarrhea -joint  pain -tiredness This list may not describe all possible side effects. Call your doctor for medical advice about side effects. You may report side effects to FDA at  1-800-FDA-1088. Where should I keep my medicine? This drug is given in a hospital or clinic and will not be stored at home. NOTE: This sheet is a summary. It may not cover all possible information. If you have questions about this medicine, talk to your doctor, pharmacist, or health care provider.    2016, Elsevier/Gold Standard. (2014-07-12 17:24:19) [facility] Discharge Instructions for Patients Receiving Chemotherapy  Today you received the following chemotherapy agents Keytruda.  To help prevent nausea and vomiting after your treatment, we encourage you to take your nausea medication as directed.  If you develop nausea and vomiting that is not controlled by your nausea medication, call the clinic.   BELOW ARE SYMPTOMS THAT SHOULD BE REPORTED IMMEDIATELY:  *FEVER GREATER THAN 100.5 F  *CHILLS WITH OR WITHOUT FEVER  NAUSEA AND VOMITING THAT IS NOT CONTROLLED WITH YOUR NAUSEA MEDICATION  *UNUSUAL SHORTNESS OF BREATH  *UNUSUAL BRUISING OR BLEEDING  TENDERNESS IN MOUTH AND THROAT WITH OR WITHOUT PRESENCE OF ULCERS  *URINARY PROBLEMS  *BOWEL PROBLEMS  UNUSUAL RASH Items with * indicate a potential emergency and should be followed up as soon as possible.  Feel free to call the clinic you have any questions or concerns. The clinic phone number is [phone].  Please show the [place] at check-in to the Emergency Department and triage nurse.

## 2015-04-25 ENCOUNTER — Telehealth: Payer: Self-pay | Admitting: Medical Oncology

## 2015-04-25 ENCOUNTER — Telehealth: Payer: Self-pay

## 2015-04-25 NOTE — Telephone Encounter (Signed)
Unfortunately, It is related to his disease and will not get better until the radiation and immunotherapy shrink the tumor. PEG tube is another option if he can't swallow.

## 2015-04-25 NOTE — Telephone Encounter (Signed)
Pt stated he did fine with immunotherpy

## 2015-04-25 NOTE — Telephone Encounter (Signed)
S/w wife and she stated he will not likely go for PEG tube. She is asking about something to increase appetite, and she also was interested in magic mouth wash or similar for his pain. They use [facility] outpatient pharmacy.

## 2015-04-25 NOTE — Telephone Encounter (Signed)
Throat is narrow and many times unable to swallow food, liquids, or his own saliva. Worsening since finished up radiation 11/18. Also painful if food does pass. Pt has chest pain last couple of weeks - is also worsening. It is dull, not tight, not stabbing, primarily on the L side of chest (his cancer is on the R). Next appt 12/19. No fever, he has been coughing. Family is waiting for the results of his sleep study done at home last week. He is trying to eat and drink but not successful. Family is looking for direction of what to do.

## 2015-04-27 ENCOUNTER — Other Ambulatory Visit: Payer: Self-pay | Admitting: Radiation Oncology

## 2015-04-27 ENCOUNTER — Telehealth: Payer: Self-pay | Admitting: Oncology

## 2015-04-27 MED ORDER — OXYCODONE HCL 15 MG PO TABS: 15.0000 mg | ORAL_TABLET | ORAL | Status: DC | PRN

## 2015-04-27 NOTE — Telephone Encounter (Signed)
[name] called and said Aysen needs a refill on his pain medication.  He took his last tablet this morning.  She said the pain has improved "a little bit" since radiation.  Advised her that Dr. Sondra Come will be notified and that we will call her back.

## 2015-04-28 NOTE — Telephone Encounter (Signed)
Called to see if pt got an Rx for MMW or similar for his sore throat. They did call rad-onc for pain medication today and LVM that the pt could ask them for MMW as well if needed.

## 2015-05-01 ENCOUNTER — Telehealth: Payer: Self-pay | Admitting: Medical Oncology

## 2015-05-01 NOTE — Telephone Encounter (Signed)
Can he get an appetite stimulant ?

## 2015-05-02 ENCOUNTER — Telehealth: Payer: Self-pay | Admitting: Medical Oncology

## 2015-05-02 DIAGNOSIS — R630 Anorexia: Secondary | ICD-10-CM

## 2015-05-02 MED ORDER — MEGESTROL ACETATE 625 MG/5ML PO SUSP: 625.0000 mg | Freq: Every day | ORAL | Status: DC

## 2015-05-02 NOTE — Telephone Encounter (Signed)
rx sent to local pharmacy and message left on pts voice mail .

## 2015-05-02 NOTE — Telephone Encounter (Signed)
-----   Message from Curt Bears, MD sent at 05/01/2015  4:38 PM EST ----- Regarding: RE: requests appetite stimulant Megace ES 625 mg (5 ml) po qd. ----- Message -----    From: Ardeen Garland, RN    Sent: 05/01/2015   4:28 PM      To: Curt Bears, MD Subject: requests appetite stimulant                    Thinks a stimulant may help him eat .

## 2015-05-04 ENCOUNTER — Telehealth: Payer: Self-pay | Admitting: *Deleted

## 2015-05-04 ENCOUNTER — Telehealth: Payer: Self-pay | Admitting: Oncology

## 2015-05-04 NOTE — Telephone Encounter (Signed)
I called [facility] and Dr Sondra Come ordered test so I asked [facility] to fax sleep test results to him and to please let wife know that.

## 2015-05-04 NOTE — Telephone Encounter (Signed)
TC received from pt's wife stating that her husband had home sleep study done to evaluate need for home 02. This was done 2 weeks ago and she has not heard back from [facility] about the oxygen.  Barry Cook states that he continues to be short of breath and generally not not doing very well. She is aware of next appt with Dr. Julien Nordmann, lab and chemo  on the 19th. Barry Cook is asking if we can help facilitate the home oxygen delivery or at least results of sleep study.

## 2015-05-04 NOTE — Telephone Encounter (Addendum)
Faxed order for 2L of nocturnal oxygen to [place].  Called Barry Cook and advised her that nocturnal oxygen has been ordered for Barry Cook  Asked that they call back if they do not hear from [facility] this afternoon.  Barry Cook said [name] was up all night with nausea and vomiting.  She is one her way to the [place] to pick up a prescription for nausea medication.  She then asked if she would be able to pick up a gas card today.  Advised her that [place] will be contacted and we will let her know.

## 2015-05-04 NOTE — Telephone Encounter (Signed)
[facility] regarding overnight oximetry results.  Per Barry Cook does qualify for nocturnal oxygen.  She needs an order for nocturnal oxygen via nasal canula with the appropriate liter flow.  She said that usually the orders start at 2L.  She also needs the last office note mentioning the need for oxygen.

## 2015-05-05 ENCOUNTER — Telehealth: Payer: Self-pay | Admitting: Oncology

## 2015-05-05 NOTE — Telephone Encounter (Signed)
Received a call from [facility] that [place] needs $150.00 to set up oxygen since they do not have insurance.  Advised her that our financial advisors will be contacted and we will call her back.

## 2015-05-08 ENCOUNTER — Inpatient Hospital Stay (HOSPITAL_COMMUNITY)
Admission: EM | Admit: 2015-05-08 | Discharge: 2015-05-12 | DRG: 194 | Disposition: A | Discharge status: Home / Self Care | Payer: Medicaid Other | Attending: Internal Medicine | Admitting: Internal Medicine

## 2015-05-08 ENCOUNTER — Emergency Department (HOSPITAL_COMMUNITY): Payer: Medicaid Other

## 2015-05-08 ENCOUNTER — Encounter (HOSPITAL_COMMUNITY): Payer: Self-pay | Admitting: *Deleted

## 2015-05-08 DIAGNOSIS — Z87891 Personal history of nicotine dependence: Secondary | ICD-10-CM

## 2015-05-08 DIAGNOSIS — R066 Hiccough: Secondary | ICD-10-CM | POA: Diagnosis present

## 2015-05-08 DIAGNOSIS — D649 Anemia, unspecified: Secondary | ICD-10-CM | POA: Diagnosis present

## 2015-05-08 DIAGNOSIS — G893 Neoplasm related pain (acute) (chronic): Secondary | ICD-10-CM | POA: Diagnosis present

## 2015-05-08 DIAGNOSIS — D638 Anemia in other chronic diseases classified elsewhere: Secondary | ICD-10-CM | POA: Diagnosis present

## 2015-05-08 DIAGNOSIS — C801 Malignant (primary) neoplasm, unspecified: Secondary | ICD-10-CM | POA: Diagnosis present

## 2015-05-08 DIAGNOSIS — J189 Pneumonia, unspecified organism: Principal | ICD-10-CM

## 2015-05-08 DIAGNOSIS — C3431 Malignant neoplasm of lower lobe, right bronchus or lung: Secondary | ICD-10-CM | POA: Diagnosis present

## 2015-05-08 DIAGNOSIS — C799 Secondary malignant neoplasm of unspecified site: Secondary | ICD-10-CM

## 2015-05-08 DIAGNOSIS — I959 Hypotension, unspecified: Secondary | ICD-10-CM | POA: Diagnosis present

## 2015-05-08 DIAGNOSIS — C7970 Secondary malignant neoplasm of unspecified adrenal gland: Secondary | ICD-10-CM | POA: Diagnosis present

## 2015-05-08 DIAGNOSIS — C7889 Secondary malignant neoplasm of other digestive organs: Secondary | ICD-10-CM | POA: Diagnosis present

## 2015-05-08 DIAGNOSIS — R918 Other nonspecific abnormal finding of lung field: Secondary | ICD-10-CM | POA: Diagnosis present

## 2015-05-08 DIAGNOSIS — C7951 Secondary malignant neoplasm of bone: Secondary | ICD-10-CM | POA: Diagnosis present

## 2015-05-08 DIAGNOSIS — C3430 Malignant neoplasm of lower lobe, unspecified bronchus or lung: Secondary | ICD-10-CM | POA: Diagnosis present

## 2015-05-08 DIAGNOSIS — Z515 Encounter for palliative care: Secondary | ICD-10-CM | POA: Diagnosis present

## 2015-05-08 DIAGNOSIS — R079 Chest pain, unspecified: Secondary | ICD-10-CM | POA: Diagnosis present

## 2015-05-08 DIAGNOSIS — Z923 Personal history of irradiation: Secondary | ICD-10-CM

## 2015-05-08 DIAGNOSIS — R1310 Dysphagia, unspecified: Secondary | ICD-10-CM | POA: Diagnosis present

## 2015-05-08 LAB — TROPONIN I: Troponin I: <0.03 ng/mL (ref <0.031)

## 2015-05-08 LAB — CBC
HCT: 26.5 % — ABNORMAL LOW (ref 39.0–52.0)
Hemoglobin: 8.8 g/dL — ABNORMAL LOW (ref 13.0–17.0)
MCH: 28.9 pg (ref 26.0–34.0)
MCHC: 33.2 g/dL (ref 30.0–36.0)
MCV: 87.2 fL (ref 78.0–100.0)
Platelets: 192 10*3/uL (ref 150–400)
RBC: 3.04 MIL/uL — ABNORMAL LOW (ref 4.22–5.81)
RDW: 13.1 % (ref 11.5–15.5)
WBC: 6.6 10*3/uL (ref 4.0–10.5)

## 2015-05-08 LAB — BASIC METABOLIC PANEL
Anion gap: 10 (ref 5–15)
BUN: 17 mg/dL (ref 6–20)
CO2: 25 mmol/L (ref 22–32)
Calcium: 9 mg/dL (ref 8.9–10.3)
Chloride: 95 mmol/L — ABNORMAL LOW (ref 101–111)
Creatinine, Ser: 0.77 mg/dL (ref 0.61–1.24)
GFR calc Af Amer: >60 mL/min (ref >60)
GFR calc non Af Amer: >60 mL/min (ref >60)
Glucose, Bld: 97 mg/dL (ref 65–99)
Potassium: 4 mmol/L (ref 3.5–5.1)
Sodium: 130 mmol/L — ABNORMAL LOW (ref 135–145)

## 2015-05-08 MED ORDER — SODIUM CHLORIDE 0.9 % IV BOLUS (SEPSIS)
500.0000 mL | Freq: Once | INTRAVENOUS | Status: AC
  Administered 2015-05-08: 500 mL via INTRAVENOUS

## 2015-05-08 MED ORDER — SODIUM CHLORIDE 0.9 % IV BOLUS (SEPSIS)
1000.0000 mL | Freq: Once | INTRAVENOUS | Status: AC
  Administered 2015-05-09: 1000 mL via INTRAVENOUS

## 2015-05-08 NOTE — ED Notes (Signed)
Dr Jeneen Rinks notified of BP

## 2015-05-08 NOTE — ED Provider Notes (Signed)
CSN: [id number]     Arrival date & time 05/08/15  2027 History  By signing my name below, I, [name], attest that this documentation has been prepared under the direction and in the presence of Virgel Manifold, MD. Electronically Signed: [name], ED Scribe. 05/08/2015. 11:58 PM    Chief Complaint  Patient presents with  . Chest Pain    The history is provided by the patient. No language interpreter was used.  Barry Cook is a 59 y.o. male with history of stage IV lung cancer s/p radiation therapy on immunotherapy who presents to the Emergency Department complaining of constant, worsening, 5/10 in severity, sharp, right-sided chest pain with onset 3 days ago.  Associated symptoms include chills, hiccups, indigestion, SOB that is worse at night, sore throat, difficulty swallowing for 1 month, and hemoptysis. He also complains of decreased urinary output for the last several days. Pt denies fever, abdominal pain, dysuria, hematochezia, and dark or black stool.   Past Medical History  Diagnosis Date  . Cancer [facility])     Lung   History reviewed. No pertinent past surgical history. History reviewed. No pertinent family history. Social History  Substance Use Topics  . Smoking status: Former Smoker -- 1.00 packs/day    Types: Cigarettes  . Smokeless tobacco: None  . Alcohol Use: 7.2 oz/week    12 Cans of beer per week     Comment: occ    Review of Systems  Constitutional: Positive for chills. Negative for fever.  HENT: Positive for sore throat and trouble swallowing.   Respiratory: Positive for cough (productive of blood) and shortness of breath.   Cardiovascular: Positive for chest pain.  Gastrointestinal:       Hiccups and indigestion  Genitourinary:       Decreased urinary output  All other systems reviewed and are negative.  Allergies  Review of patient's allergies indicates no known allergies.  Home Medications   Prior to Admission medications    Medication Sig Start Date End Date Taking? Authorizing Provider  B Complex-Biotin-FA (B-COMPLEX PO) Take 1 tablet by mouth daily.   Yes Historical Provider, MD  megestrol (MEGACE ES) 625 MG/5ML suspension Take 5 mLs (625 mg total) by mouth daily. 05/02/15  Yes Curt Bears, MD  Multiple Vitamin (MULTIVITAMIN WITH MINERALS) TABS tablet Take 1 tablet by mouth daily.   Yes Historical Provider, MD  oxyCODONE (ROXICODONE) 15 MG immediate release tablet Take 1 tablet (15 mg total) by mouth every 4 (four) hours as needed for pain. 04/27/15  Yes Gery Pray, MD  polyethylene glycol [facility] / GLYCOLAX) packet Take 17 g by mouth 2 (two) times daily.   Yes Historical Provider, MD  traZODone (DESYREL) 50 MG tablet Take 1 tablet (50 mg total) by mouth at bedtime as needed and may repeat dose one time if needed for sleep. Patient taking differently: Take 25-50 mg by mouth at bedtime as needed and may repeat dose one time if needed for sleep.  11/11/12  Yes Niel Hummer, NP  oxyCODONE-acetaminophen (ROXICET) 5-325 MG tablet Take 1-2 tablets by mouth every 4 (four) hours as needed for severe pain. Patient not taking: Reported on 05/08/2015 03/31/15   Kyung Rudd, MD   BP 85/66 mmHg  Pulse 72  Temp(Src) 98.5 F (36.9 C) (Oral)  Resp 17  Ht '6\' 1"'$  (1.854 m)  Wt 152 lb (68.947 kg)  BMI 20.06 kg/m2  SpO2 96% Physical Exam  Constitutional: He is oriented to person, place, and time.  He appears well-developed and well-nourished.  HENT:  Head: Normocephalic and atraumatic.  Eyes: EOM are normal.  Neck: Normal range of motion.  Cardiovascular: Normal rate, regular rhythm, normal heart sounds and intact distal pulses.   Pulmonary/Chest: Effort normal. No respiratory distress. He has wheezes (Bilateral R>L).  Abdominal: Soft. He exhibits no distension. There is no tenderness.  Musculoskeletal: Normal range of motion.  Neurological: He is alert and oriented to person, place, and time.  Skin: Skin is warm and  dry.  Psychiatric: He has a normal mood and affect. Judgment normal.  Nursing note and vitals reviewed.   ED Course  Procedures (including critical care time) DIAGNOSTIC STUDIES: Oxygen Saturation is 96% on RA,  normal by my interpretation.    COORDINATION OF CARE: 11:14 PM Discussed treatment plan which includes lab work, CXR, EKG, IVF, and admission to the hospital with pt at bedside and pt agreed to plan.  Labs Review Labs Reviewed  BASIC METABOLIC PANEL - Abnormal; Notable for the following:    Sodium 130 (*)    Chloride 95 (*)    All other components within normal limits  CBC - Abnormal; Notable for the following:    RBC 3.04 (*)    Hemoglobin 8.8 (*)    HCT 26.5 (*)    All other components within normal limits  TROPONIN I  OCCULT BLOOD X 1 CARD TO LAB, STOOL  TYPE AND SCREEN    Imaging Review Dg Chest 2 View  05/08/2015  CLINICAL DATA:  PICC absent indigestion for 3 days. Chest pain. History of lung cancer. Former smoker. EXAM: CHEST  2 VIEW COMPARISON:  CT chest 03/16/2015.  Chest 03/07/2015 FINDINGS: Cavitary mass again demonstrated in the right cardiophrenic angle posteriorly. There is now fluid within the mass and an air-fluid level is demonstrated. Possible right paratracheal and right hilar lymphadenopathy. Normal heart size and pulmonary vascularity. Calcified and tortuous aorta. Diffuse interstitial pattern to the lungs is increased since previous study, possibly indicating interstitial edema, interstitial pneumonia, or interstitial tumor spread. Hyperinflation consistent with underlying emphysema. No pneumothorax. Old left rib fracture. IMPRESSION: Cavitary mass in the right lung base posteriorly now contains an air-fluid level. Probable lymphadenopathy in the right paratracheal and right hilar regions. Increasing interstitial pattern to the lungs may indicate developing interstitial edema, interstitial pneumonia, or interstitial tumor spread. Electronically Signed    By: Lucienne Capers M.D.   On: 05/08/2015 23:12   I have personally reviewed and evaluated these images and lab results as part of my medical decision-making.   EKG Interpretation   Date/Time:  Monday May 08 2015 21:19:25 EST Ventricular Rate:  70 PR Interval:    QRS Duration: 122 QT Interval:  422 QTC Calculation: 455 R Axis:   117 Text Interpretation:  Accelerated junctional rhythm Nonspecific  intraventricular conduction delay Probable lateral infarct, old  Anteroseptal infarct, old Baseline wander in lead(s) I II aVR V3 V6 No  previous ECGs available Confirmed by ZACKOWSKI  MD, SCOTT ([id number]) on  05/08/2015 9:23:08 PM      MDM   Final diagnoses:  Metastatic cancer ([place])  Anemia, unspecified anemia type    59yM with multiple complaints.  CP which is very atypical for ACS. R sided CP with known metastatic lung CA. Increased interstitial pattern. Consider edema, infectious, cancer, radiation changes, etc. Will cover with abx although not convinced infectious. Clinically volume depleted.   I personally preformed the services scribed in my presence. The recorded information has been reviewed is accurate.  Virgel Manifold, MD.    Virgel Manifold, MD 06/07/15 [phone]

## 2015-05-08 NOTE — ED Notes (Signed)
Pt states that he has been having the hiccups and indigestion for the past three days, chest pain that today with sob,

## 2015-05-09 ENCOUNTER — Encounter (HOSPITAL_COMMUNITY): Payer: Self-pay | Admitting: Internal Medicine

## 2015-05-09 ENCOUNTER — Inpatient Hospital Stay (HOSPITAL_COMMUNITY): Payer: Medicaid Other

## 2015-05-09 ENCOUNTER — Telehealth: Payer: Self-pay

## 2015-05-09 ENCOUNTER — Emergency Department (HOSPITAL_COMMUNITY): Payer: Medicaid Other

## 2015-05-09 DIAGNOSIS — C3432 Malignant neoplasm of lower lobe, left bronchus or lung: Secondary | ICD-10-CM | POA: Diagnosis not present

## 2015-05-09 DIAGNOSIS — R918 Other nonspecific abnormal finding of lung field: Secondary | ICD-10-CM | POA: Diagnosis not present

## 2015-05-09 DIAGNOSIS — R079 Chest pain, unspecified: Secondary | ICD-10-CM | POA: Diagnosis present

## 2015-05-09 DIAGNOSIS — Z515 Encounter for palliative care: Secondary | ICD-10-CM | POA: Diagnosis present

## 2015-05-09 DIAGNOSIS — C7889 Secondary malignant neoplasm of other digestive organs: Secondary | ICD-10-CM | POA: Diagnosis present

## 2015-05-09 DIAGNOSIS — C7951 Secondary malignant neoplasm of bone: Secondary | ICD-10-CM | POA: Diagnosis present

## 2015-05-09 DIAGNOSIS — C801 Malignant (primary) neoplasm, unspecified: Secondary | ICD-10-CM | POA: Diagnosis not present

## 2015-05-09 DIAGNOSIS — G893 Neoplasm related pain (acute) (chronic): Secondary | ICD-10-CM | POA: Diagnosis not present

## 2015-05-09 DIAGNOSIS — J189 Pneumonia, unspecified organism: Secondary | ICD-10-CM | POA: Diagnosis present

## 2015-05-09 DIAGNOSIS — R1310 Dysphagia, unspecified: Secondary | ICD-10-CM | POA: Diagnosis present

## 2015-05-09 DIAGNOSIS — C3431 Malignant neoplasm of lower lobe, right bronchus or lung: Secondary | ICD-10-CM | POA: Diagnosis present

## 2015-05-09 DIAGNOSIS — I959 Hypotension, unspecified: Secondary | ICD-10-CM | POA: Diagnosis present

## 2015-05-09 DIAGNOSIS — D649 Anemia, unspecified: Secondary | ICD-10-CM | POA: Diagnosis present

## 2015-05-09 DIAGNOSIS — Z87891 Personal history of nicotine dependence: Secondary | ICD-10-CM | POA: Diagnosis not present

## 2015-05-09 DIAGNOSIS — Z923 Personal history of irradiation: Secondary | ICD-10-CM | POA: Diagnosis not present

## 2015-05-09 DIAGNOSIS — C7970 Secondary malignant neoplasm of unspecified adrenal gland: Secondary | ICD-10-CM | POA: Diagnosis not present

## 2015-05-09 DIAGNOSIS — R066 Hiccough: Secondary | ICD-10-CM | POA: Diagnosis present

## 2015-05-09 DIAGNOSIS — D638 Anemia in other chronic diseases classified elsewhere: Secondary | ICD-10-CM | POA: Diagnosis present

## 2015-05-09 LAB — TSH: TSH: 1.302 u[IU]/mL (ref 0.350–4.500)

## 2015-05-09 LAB — TYPE AND SCREEN
ABO/RH(D): A POS
Antibody Screen: NEGATIVE

## 2015-05-09 LAB — FOLATE: Folate: 15.1 ng/mL (ref >5.9)

## 2015-05-09 LAB — VITAMIN B12: Vitamin B-12: 173 pg/mL — ABNORMAL LOW (ref 180–914)

## 2015-05-09 LAB — IRON AND TIBC
Iron: 11 ug/dL — ABNORMAL LOW (ref 45–182)
SATURATION RATIOS: 10 % — AB (ref 17.9–39.5)
TIBC: 115 ug/dL — AB (ref 250–450)
UIBC: 104 ug/dL

## 2015-05-09 LAB — TROPONIN I
Troponin I: <0.03 ng/mL (ref <0.031)
Troponin I: <0.03 ng/mL (ref <0.031)
Troponin I: <0.03 ng/mL (ref <0.031)

## 2015-05-09 LAB — RETICULOCYTES
RBC.: 3.07 MIL/uL — ABNORMAL LOW (ref 4.22–5.81)
RETIC CT PCT: 0.7 % (ref 0.4–3.1)
Retic Count, Absolute: 21.5 10*3/uL (ref 19.0–186.0)

## 2015-05-09 LAB — FERRITIN: FERRITIN: 586 ng/mL — AB (ref 24–336)

## 2015-05-09 MED ORDER — SODIUM CHLORIDE 0.9 % IJ SOLN
3.0000 mL | Freq: Two times a day (BID) | INTRAMUSCULAR | Status: DC
  Administered 2015-05-09 (×2): 3 mL via INTRAVENOUS

## 2015-05-09 MED ORDER — CHLORPROMAZINE HCL 100 MG PO TABS: 50.0000 mg | ORAL_TABLET | Freq: Four times a day (QID) | ORAL | Status: DC | PRN

## 2015-05-09 MED ORDER — POLYETHYLENE GLYCOL 3350 17 G PO PACK
17.0000 g | PACK | Freq: Two times a day (BID) | ORAL | Status: DC
  Administered 2015-05-09 – 2015-05-12 (×8): 17 g via ORAL
  Filled 2015-05-09 (×7): qty 1

## 2015-05-09 MED ORDER — TRAZODONE HCL 50 MG PO TABS
25.0000 mg | ORAL_TABLET | Freq: Every evening | ORAL | Status: DC | PRN
  Administered 2015-05-10: 50 mg via ORAL
  Filled 2015-05-09: qty 1

## 2015-05-09 MED ORDER — ZOLPIDEM TARTRATE 5 MG PO TABS
5.0000 mg | ORAL_TABLET | Freq: Every evening | ORAL | Status: DC | PRN
  Administered 2015-05-09 – 2015-05-12 (×2): 5 mg via ORAL
  Filled 2015-05-09 (×2): qty 1

## 2015-05-09 MED ORDER — ACETAMINOPHEN 325 MG PO TABS: 650.0000 mg | ORAL_TABLET | Freq: Four times a day (QID) | ORAL | Status: DC | PRN

## 2015-05-09 MED ORDER — DEXTROSE-NACL 5-0.9 % IV SOLN
INTRAVENOUS | Status: DC
  Administered 2015-05-09 – 2015-05-11 (×3): via INTRAVENOUS

## 2015-05-09 MED ORDER — MORPHINE SULFATE (PF) 2 MG/ML IV SOLN
2.0000 mg | INTRAVENOUS | Status: DC | PRN
  Administered 2015-05-09 – 2015-05-10 (×4): 2 mg via INTRAVENOUS
  Filled 2015-05-09 (×7): qty 1

## 2015-05-09 MED ORDER — ACETAMINOPHEN 650 MG RE SUPP: 650.0000 mg | Freq: Four times a day (QID) | RECTAL | Status: DC | PRN

## 2015-05-09 MED ORDER — ONDANSETRON HCL 4 MG/2ML IJ SOLN: 4.0000 mg | Freq: Four times a day (QID) | INTRAMUSCULAR | Status: DC | PRN

## 2015-05-09 MED ORDER — PIPERACILLIN-TAZOBACTAM 3.375 G IVPB 30 MIN
3.3750 g | Freq: Once | INTRAVENOUS | Status: AC
  Administered 2015-05-09: 3.375 g via INTRAVENOUS
  Filled 2015-05-09: qty 50

## 2015-05-09 MED ORDER — VANCOMYCIN HCL IN DEXTROSE 750-5 MG/150ML-% IV SOLN
750.0000 mg | Freq: Three times a day (TID) | INTRAVENOUS | Status: DC
  Administered 2015-05-09 – 2015-05-11 (×6): 750 mg via INTRAVENOUS
  Filled 2015-05-09 (×10): qty 150

## 2015-05-09 MED ORDER — GI COCKTAIL ~~LOC~~
30.0000 mL | Freq: Once | ORAL | Status: AC
  Administered 2015-05-09: 30 mL via ORAL
  Filled 2015-05-09: qty 30

## 2015-05-09 MED ORDER — PIPERACILLIN-TAZOBACTAM 3.375 G IVPB
3.3750 g | Freq: Three times a day (TID) | INTRAVENOUS | Status: DC
  Administered 2015-05-09 – 2015-05-12 (×9): 3.375 g via INTRAVENOUS
  Filled 2015-05-09 (×7): qty 50

## 2015-05-09 MED ORDER — PANTOPRAZOLE SODIUM 40 MG PO TBEC
40.0000 mg | DELAYED_RELEASE_TABLET | Freq: Once | ORAL | Status: AC
Start: 2015-05-09 — End: 2015-05-09
  Administered 2015-05-09: 40 mg via ORAL
  Filled 2015-05-09: qty 1

## 2015-05-09 MED ORDER — ENOXAPARIN SODIUM 40 MG/0.4ML ~~LOC~~ SOLN
40.0000 mg | SUBCUTANEOUS | Status: DC
  Administered 2015-05-09 – 2015-05-12 (×4): 40 mg via SUBCUTANEOUS
  Filled 2015-05-09 (×4): qty 0.4

## 2015-05-09 MED ORDER — IOHEXOL 350 MG/ML SOLN
100.0000 mL | Freq: Once | INTRAVENOUS | Status: AC | PRN
  Administered 2015-05-09: 100 mL via INTRAVENOUS

## 2015-05-09 MED ORDER — ONDANSETRON HCL 4 MG PO TABS: 4.0000 mg | ORAL_TABLET | Freq: Four times a day (QID) | ORAL | Status: DC | PRN

## 2015-05-09 MED ORDER — ENOXAPARIN SODIUM 40 MG/0.4ML ~~LOC~~ SOLN: 40.0000 mg | SUBCUTANEOUS | Status: DC

## 2015-05-09 MED ORDER — VANCOMYCIN HCL IN DEXTROSE 750-5 MG/150ML-% IV SOLN
INTRAVENOUS | Status: AC
  Filled 2015-05-09: qty 150

## 2015-05-09 MED ORDER — VANCOMYCIN HCL IN DEXTROSE 750-5 MG/150ML-% IV SOLN
750.0000 mg | Freq: Once | INTRAVENOUS | Status: AC
  Administered 2015-05-09: 750 mg via INTRAVENOUS
  Filled 2015-05-09: qty 150

## 2015-05-09 NOTE — Telephone Encounter (Signed)
Wife called to let Dr Julien Nordmann know pt is admitted to [facility]. They had called on call nurse on Saturday with severe and constant hiccups, pain in the chest and vomiting. They told him to go to ER. He went to ER 12/12. He was admitted. Dr Marin Comment is his hospitalist.

## 2015-05-09 NOTE — Progress Notes (Signed)
ANTIBIOTIC CONSULT NOTE-Preliminary  Pharmacy Consult for Vancomycin and Zosyn Indication: Pneumonia  No Known Allergies  Patient Measurements: Height: '6\' 1"'$  (185.4 cm) Weight: 152 lb (68.947 kg) IBW/kg (Calculated) : 79.9   Vital Signs: Temp: 98.8 F (37.1 C) (12/13 0213) Temp Source: Oral (12/13 0213) BP: 103/59 mmHg (12/13 0213) Pulse Rate: 83 (12/13 0213)  Labs:  Recent Labs  05/08/15 2125  WBC 6.6  HGB 8.8*  PLT 192  CREATININE 0.77    Estimated Creatinine Clearance: 95.7 mL/min (by C-G formula based on Cr of 0.77).  No results for input(s): VANCOTROUGH, VANCOPEAK, VANCORANDOM, GENTTROUGH, GENTPEAK, GENTRANDOM, TOBRATROUGH, TOBRAPEAK, TOBRARND, AMIKACINPEAK, AMIKACINTROU, AMIKACIN in the last 72 hours.   Microbiology: No results found for this or any previous visit (from the past 720 hour(s)).  Medical History: Past Medical History  Diagnosis Date  . Cancer (HCC)     Lung    Medications:  Zosyn 3.375 Gm IV x 1 dose at 0014 05/09/15  Assessment: 59 yo male with hx sig for stage IV lung cancer s/p radiation and immunotherapy, seen in the ED with constant worsening chest pain x 3 days. Pt also has SOB, sore throat and difficulty swallowing x 1 month and hemoptysis. Chest x-ray shows possible pneumonia.  Goal of Therapy:  Vancomycin troughs 15-20 mcg/ml Eradicate infection  Plan:  Preliminary review of pertinent patient information completed.  Protocol will be initiated with a one-time dose of Vancomycin 750 mg IV.  Forestine Na clinical pharmacist will complete review during morning rounds to assess patient and finalize treatment regimen.  Norberto Sorenson, [facility] 05/09/2015,3:01 AM

## 2015-05-09 NOTE — Progress Notes (Signed)
Patient seen and evaluated earlier this AM by my associate. Please refer to H and P for details regarding assessment and plan.  Pt has no complaints currently. CT angiogram of chest reporting large cavitary mass at the right lower lobe and concerns for findings suspicious for postobstructive pneumonia. This was reviewed with patient and family member that side.  General: Patient no acute distress, alert and awake Cardiovascular: S1 and S2 within normal limits, no grossly Pulmonary: Equal chest rise, no accessory muscle use, no audible wheezes  We'll plan on reassessing next AM. Patient currently on broad-spectrum antibiotics vancomycin and Pip-Tazo.  Barry Cook, [employer]

## 2015-05-09 NOTE — Evaluation (Signed)
Clinical/Bedside Swallow Evaluation Patient Details  Name: Barry Cook MRN: [id number] Date of Birth: Sep 12, 1955  Today's Date: 05/09/2015 Time: SLP Start Time (ACUTE ONLY): 1200 SLP Stop Time (ACUTE ONLY): 1230 SLP Time Calculation (min) (ACUTE ONLY): 30 min  Past Medical History:  Past Medical History  Diagnosis Date  . Cancer ([place])     Lung   Past Surgical History: History reviewed. No pertinent past surgical history. HPI:  Barry Cook is an 59 y.o. 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 He has no fever, chills, or coughs. He denied SOB, but admitted to having trouble with swallowing. Evlaution in the ER included a Hb of 8.8 g per dL, a drop from prior normal Hb. He has no black stool or bloody stool, and has no evidence of bleeding. EKG showed no acute ST T changes, and troponin was normal. He had a CTA which was negative for PE, confirming the present of his large lung mass, and also suggestive of PNA. He was given IV Zosyn in the ER, and hospitalist was asked to admit him for chest pain, anemia, nausea, vomting, and hiccups. He normally sees Dr Curt Bears at [place] / Plan / Recommendation Clinical Impression  Pt reports difficulty swallowing s/p radiation therapy to back of neck which concluded about a month ago. He reports excess saliva in oral cavity and globus sensation in pharynx. Chest x-ray concerning for PNA. Oral motor examination is WFL; hyolaryngeal excursion seemingly reduced although difficult to tell at bedside. Pt shows no overt signs of aspiration during this exam, however pt with reports of intermittent difficulty swallowing and PNA in setting of s/p radiation therapy for cancer. Recommend MBSS to objectively  assess swallow and implement appropriate strategies as needed. Above to Dr. Wendee Beavers.    Aspiration Risk  Mild aspiration risk    Diet Recommendation  (continue diet as ordered pending MBSS)   Medication Administration: Whole meds with liquid Supervision: Patient able to self feed Postural Changes: Seated upright at 90 degrees;Remain upright for at least 30 minutes after po intake    Other  Recommendations Oral Care Recommendations: Oral care BID Other Recommendations: Clarify dietary restrictions   Follow up Recommendations       Frequency and Duration            Prognosis Prognosis for Safe Diet Advancement: [place] Study   General Date of Onset: 05/08/15 HPI: Barry Cook is an 59 y.o. 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 He has no fever, chills, or coughs. He denied SOB, but admitted to having trouble with swallowing. Evlaution in the ER included a Hb of 8.8 g per dL, a drop from prior normal Hb. He has no black stool or bloody stool, and has no evidence of bleeding. EKG showed no acute ST T changes, and troponin was normal. He had a CTA which was negative for PE, confirming the present of his large lung mass, and also suggestive of PNA. He was given IV Zosyn in the ER, and hospitalist was asked to  admit him for chest pain, anemia, nausea, vomting, and hiccups. He normally sees Dr Curt Bears at [facility] [place] Type of Study: Bedside Swallow Evaluation Previous Swallow Assessment: None on record Diet Prior to this Study: Regular;Thin liquids Temperature Spikes Noted: No Respiratory Status: Room air History of Recent Intubation: No Behavior/Cognition: Alert;Cooperative;Pleasant mood Oral Cavity Assessment: Within Functional Limits Oral  Care Completed by SLP: No Oral Cavity - Dentition: Dentures, top;Dentures, bottom Vision: Functional for self-feeding Self-Feeding Abilities: Able to feed self Patient Positioning: Upright in bed Baseline Vocal Quality: Normal Volitional Cough: Strong Volitional Swallow: Able to elicit    Oral/Motor/Sensory Function Overall Oral Motor/Sensory Function: Within functional limits   Ice Chips Ice chips: Within functional limits   Thin Liquid Thin Liquid: Within functional limits Presentation: Self Fed;Cup;Straw    Nectar Thick Nectar Thick Liquid: Not tested   Honey Thick Honey Thick Liquid: Not tested   Puree Puree: Within functional limits Presentation: Spoon;Self Fed   Solid Solid: Within functional limits Presentation: Spoon;Self Fed      Thank you,  Genene Churn, [place]  Giovanne Nickolson 05/09/2015,2:03 PM

## 2015-05-09 NOTE — Progress Notes (Signed)
ANTIBIOTIC CONSULT NOTE - INITIAL  Pharmacy Consult for Vancomycin and Zosyn Indication: pneumonia  No Known Allergies  Patient Measurements: Height: '6\' 1"'$  (185.4 cm) Weight: 152 lb (68.947 kg) IBW/kg (Calculated) : 79.9  Vital Signs: Temp: 98.8 F (37.1 C) (12/Barry 0213) Temp Source: Oral (12/Barry 0213) BP: 103/59 mmHg (12/Barry 0213) Pulse Rate: 83 (12/Barry 0213) Intake/Output from previous day:   Intake/Output from this shift:    Labs:  Recent Labs  05/08/15 2125  WBC 6.6  HGB 8.8*  PLT 192  CREATININE 0.77   Estimated Creatinine Clearance: 95.7 mL/min (by C-G formula based on Cr of 0.77). No results for input(s): VANCOTROUGH, VANCOPEAK, VANCORANDOM, GENTTROUGH, GENTPEAK, GENTRANDOM, TOBRATROUGH, TOBRAPEAK, TOBRARND, AMIKACINPEAK, AMIKACINTROU, AMIKACIN in the last 72 hours.   Microbiology: No results found for this or any previous visit (from the past 720 hour(s)).  Medical History: Past Medical History  Diagnosis Date  . Cancer ([place])     Lung   Anti-infectives    Start     Dose/Rate Route Frequency Ordered Stop   12/Barry/16 1000  vancomycin (VANCOCIN) IVPB 750 mg/150 ml premix    Comments:  (retrieve bag from PYXIS machine)   750 mg 150 mL/hr over 60 Minutes Intravenous Every 8 hours 12/Barry/16 0750     12/Barry/16 0800  piperacillin-tazobactam (ZOSYN) IVPB 3.375 g     3.375 g 12.5 mL/hr over 240 Minutes Intravenous Every 8 hours 12/Barry/16 0747     12/Barry/16 0315  vancomycin (VANCOCIN) IVPB 750 mg/150 ml premix     750 mg 150 mL/hr over 60 Minutes Intravenous  Once 12/Barry/16 0301 12/Barry/16 0415   12/Barry/16 0015  piperacillin-tazobactam (ZOSYN) IVPB 3.375 g     3.375 g 100 mL/hr over 30 Minutes Intravenous  Once 12/Barry/16 0008 12/Barry/16 0045     Assessment: 59 y.o. 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 He has no fever, chills, or coughs.  Goal of Therapy:  Vancomycin trough level 15-20 mcg/ml  Plan:  Zosyn 3.375gm IV q8h, EID Vancomycin '750mg'$  IV q8h Check trough at steady state Monitor labs, renal fxn, progress and c/s Deescalate ABX when improved / appropriate  Hart Robinsons A 12/Barry/2016,7:50 AM

## 2015-05-09 NOTE — H&P (Signed)
[facility] History and Physical  BRANON SABINE YHC:[id number] DOB: 1955/08/07    PCP:   No PCP Per Patient   Chief Complaint: chest pain, hiccups, nausea and vomiting.   HPI: Barry Cook is an 59 y.o. 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  He has no fever, chills, or coughs.  He denied SOB, but admitted to having trouble with swallowing.  Evlaution in the ER included a  Hb of 8.8 g per dL, a drop from prior normal Hb.  He has no black stool or bloody stool, and has no evidence of bleeding.  EKG showed no acute ST T changes, and troponin was normal.  He had a CTA which was negative for PE, confirming the present of his large lung mass, and also suggestive of PNA.  He was given IV Zosyn in the ER, and hospitalist was asked to admit him for chest pain, anemia, nausea, vomting, and hiccups.  He normally sees Dr Curt Bears at [facility].    Rewiew of Systems:  Constitutional: Negative for malaise, fever and chills. No significant weight loss or weight gain Eyes: Negative for eye pain, redness and discharge, diplopia, visual changes, or flashes of light. ENMT: Negative for ear pain, nasal congestion, sinus pressure and sore throat. No headaches; tinnitus, drooling, or problem swallowing. Cardiovascular: Negative for palpitations, diaphoresis,  and peripheral edema. ; No orthopnea, PND Respiratory: Negative for cough, hemoptysis, wheezing and stridor. No pleuritic chestpain. Gastrointestinal: Negative for nausea, vomiting, diarrhea, constipation, abdominal pain, melena, blood in stool, hematemesis, jaundice and rectal bleeding.    Genitourinary: Negative for frequency, dysuria, incontinence,flank pain and hematuria; Musculoskeletal: Negative for back pain and  neck pain. Negative for swelling and trauma.;  Skin: . Negative for pruritus, rash, abrasions, bruising and skin lesion.; ulcerations Neuro: Negative for headache, lightheadedness and neck stiffness. Negative for weakness, altered level of consciousness , altered mental status, extremity weakness, burning feet, involuntary movement, seizure and syncope.  Psych: negative for anxiety, depression, insomnia, tearfulness, panic attacks, hallucinations, paranoia, suicidal or homicidal ideation    Past Medical History  Diagnosis Date  . Cancer [facility])     Lung    History reviewed. No pertinent past surgical history.  Medications:  HOME MEDS: Prior to Admission medications   Medication Sig Start Date End Date Taking? Authorizing Provider  B Complex-Biotin-FA (B-COMPLEX PO) Take 1 tablet by mouth daily.   Yes Historical Provider, MD  megestrol (MEGACE ES) 625 MG/5ML suspension Take 5 mLs (625 mg total) by mouth daily. 05/02/15  Yes Curt Bears, MD  Multiple Vitamin (MULTIVITAMIN WITH MINERALS) TABS tablet Take 1 tablet by mouth daily.   Yes Historical Provider, MD  oxyCODONE (ROXICODONE) 15 MG immediate release tablet Take 1 tablet (15 mg total) by mouth every 4 (four) hours as needed for pain. 04/27/15  Yes Gery Pray, MD  polyethylene glycol [facility] / GLYCOLAX) packet Take 17 g by mouth 2 (two) times daily.   Yes Historical Provider, MD  traZODone (DESYREL) 50 MG tablet Take 1 tablet (50 mg total) by mouth at bedtime as needed and may repeat dose one time if needed for sleep. Patient taking differently: Take 25-50 mg by mouth at bedtime as needed and may repeat dose one time if needed for sleep.  11/11/12  Yes Mickel Baas  Hiram Gash, NP  oxyCODONE-acetaminophen (ROXICET) 5-325 MG tablet Take 1-2 tablets by mouth every 4 (four) hours as needed for severe pain. Patient not taking: Reported on 05/08/2015 03/31/15   Kyung Rudd, MD     Allergies:  No Known Allergies  Social History:   reports that he  has quit smoking. His smoking use included Cigarettes. He smoked 1.00 pack per day. He does not have any smokeless tobacco history on file. He reports that he drinks about 7.2 oz of alcohol per week. He reports that he uses illicit drugs (Cocaine).  Family History: History reviewed. No pertinent family history.   Physical Exam: Filed Vitals:   05/08/15 2300 05/08/15 2330 05/08/15 2345 05/09/15 0000  BP: 85/59 99/60  85/64  Pulse: 83  82 73  Temp:      TempSrc:      Resp:  '16 19 17  '$ Height:      Weight:      SpO2: 95%  95% 100%   Blood pressure 85/64, pulse 73, temperature 98.5 F (36.9 C), temperature source Oral, resp. rate 17, height '6\' 1"'$  (1.854 m), weight 68.947 kg (152 lb), SpO2 100 %.  GEN:  Pleasant  patient lying in the stretcher in no acute distress; cooperative with exam. PSYCH:  alert and oriented x4; does not appear anxious or depressed; affect is appropriate. HEENT: Mucous membranes pink and anicteric; PERRLA; EOM intact; no cervical lymphadenopathy nor thyromegaly or carotid bruit; no JVD; There were no stridor. Neck is very supple. Breasts:: Not examined CHEST WALL: No tenderness CHEST: Normal respiration, clear to auscultation bilaterally.  HEART: Regular rate and rhythm.  There are no murmur, rub, or gallops.   BACK: No kyphosis or scoliosis; no CVA tenderness ABDOMEN: soft and non-tender; no masses, no organomegaly, normal abdominal bowel sounds; no pannus; no intertriginous candida. There is no rebound and no distention. Rectal Exam: Not done EXTREMITIES: No bone or joint deformity; age-appropriate arthropathy of the hands and knees; no edema; no ulcerations.  There is no calf tenderness. Genitalia: not examined PULSES: 2+ and symmetric SKIN: Normal hydration no rash or ulceration CNS: Cranial nerves 2-12 grossly intact no focal lateralizing neurologic deficit.  Speech is fluent; uvula elevated with phonation, facial symmetry and tongue midline. DTR are normal  bilaterally, cerebella exam is intact, barbinski is negative and strengths are equaled bilaterally.  No sensory loss.   Labs on Admission:  Basic Metabolic Panel:  Recent Labs Lab 05/08/15 2125  NA 130*  K 4.0  CL 95*  CO2 25  GLUCOSE 97  BUN 17  CREATININE 0.77  CALCIUM 9.0   Liver Function Tests: CBC:  Recent Labs Lab 05/08/15 2125  WBC 6.6  HGB 8.8*  HCT 26.5*  MCV 87.2  PLT 192   Cardiac Enzymes:  Recent Labs Lab 05/08/15 2125  TROPONINI <0.03    Radiological Exams on Admission: Dg Chest 2 View  05/08/2015  CLINICAL DATA:  PICC absent indigestion for 3 days. Chest pain. History of lung cancer. Former smoker. EXAM: CHEST  2 VIEW COMPARISON:  CT chest 03/16/2015.  Chest 03/07/2015 FINDINGS: Cavitary mass again demonstrated in the right cardiophrenic angle posteriorly. There is now fluid within the mass and an air-fluid level is demonstrated. Possible right paratracheal and right hilar lymphadenopathy. Normal heart size and pulmonary vascularity. Calcified and tortuous aorta. Diffuse interstitial pattern to the lungs is increased since previous study, possibly indicating interstitial edema, interstitial pneumonia, or interstitial tumor spread. Hyperinflation consistent with underlying emphysema. No pneumothorax.  Old left rib fracture. IMPRESSION: Cavitary mass in the right lung base posteriorly now contains an air-fluid level. Probable lymphadenopathy in the right paratracheal and right hilar regions. Increasing interstitial pattern to the lungs may indicate developing interstitial edema, interstitial pneumonia, or interstitial tumor spread. Electronically Signed   By: Lucienne Capers M.D.   On: 05/08/2015 23:12    EKG: Independently reviewed.    Assessment/Plan Present on Admission:  . Chest pain . Cancer ([place]) . Lung mass . 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 Neoplasm related pain . Lung cancer, lower lobe ([place]) .  Anemia   PLAN:  Hiccups:  Possible phrenic nerve or cervical root irritation from metastatic CA.  Will Tx with Thorazine PRN. Chest pain:  CTA was negative.  CT showed possible infection.  Will start IV Van/Zosyn.  Supplemental Oxygen. Anemia:  Unclear exact etiology.  Will follow H and H, order stool guaic and anemia panel.   Dysphagia:  Possible from cervical metastatic lesion.  Will get speech evaluation and UGI tomorrow.   Other plans as per orders.  Code Status: FULL CODE (confirmed.).    Orvan Falconer, MD. [facility] Pager [phone] 7pm to 7am.  05/09/2015, 12:57 AM

## 2015-05-09 NOTE — Procedures (Signed)
Objective Swallowing Evaluation: MBS-Modified Barium Swallow Study  Patient Details  Name: Barry Cook MRN: [id number] Date of Birth: Apr 09, 1956  Today's Date: 05/09/2015 Time: SLP Start Time (ACUTE ONLY): 1414-SLP Stop Time (ACUTE ONLY): 1430 SLP Time Calculation (min) (ACUTE ONLY): 16 min  Past Medical History:  Past Medical History  Diagnosis Date  . Cancer ([place])     Lung   Past Surgical History: History reviewed. No pertinent past surgical history. HPI: Barry Cook is an 59 y.o. 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 He has no fever, chills, or coughs. He denied SOB, but admitted to having trouble with swallowing. Evlaution in the ER included a Hb of 8.8 g per dL, a drop from prior normal Hb. He has no black stool or bloody stool, and has no evidence of bleeding. EKG showed no acute ST T changes, and troponin was normal. He had a CTA which was negative for PE, confirming the present of his large lung mass, and also suggestive of PNA. He was given IV Zosyn in the ER, and hospitalist was asked to admit him for chest pain, anemia, nausea, vomting, and hiccups. He normally sees Dr Curt Bears at [facility]  Subjective: "Doing alright"  Assessment / Plan / Recommendation  CHL IP CLINICAL IMPRESSIONS 05/09/2015  Therapy Diagnosis WFL  Clinical Impression MBSS completed this date. Swallow function essentially WFL. No penetration or aspiration observed. Pt with mild vallecular residue, but clears with repeat/dry swallow. No stasis noted in pharynx. Barium tablet became transiently delayed in distal esophagus near GE junction, but passed after sips of water and bite of applesauce. Pt with some belching after completion of test. SLP reinforced need for  pt to continue with oral diet to help combat the effects of radiation therapy long term. Given normal swallow function in today's study, prandial aspiration is doubtful. Pt does endorse heartburn and some heaving due to hiccups so it is possible that pt aspirated post prandial and/or at night. Pt reports elevating HOB at night which he will continue. Recommend continuing soft diet as ordered as pt reports some difficulty with meats/breads; ok for thin liquids. SLP will follow up X1 for diet tolerance and ongoing education as needed. Pt in agreement with plan of care.   Impact on safety and function No limitations      CHL IP TREATMENT RECOMMENDATION 05/09/2015  Treatment Recommendations Therapy as outlined in treatment plan below     Prognosis 05/09/2015  Prognosis for Safe Diet Advancement Good  Barriers to Reach Goals --  Barriers/Prognosis Comment --    CHL IP DIET RECOMMENDATION 05/09/2015  SLP Diet Recommendations Regular solids;Dysphagia 3 (Mech soft) solids;Thin liquid  Liquid Administration via Cup;Straw  Medication Administration Whole meds with liquid  Compensations Multiple dry swallows after each bite/sip  Postural Changes Remain semi-upright after after feeds/meals (Comment);Seated upright at 90 degrees      CHL IP OTHER RECOMMENDATIONS 05/09/2015  Recommended Consults --  Oral Care Recommendations Oral care BID  Other Recommendations Clarify dietary restrictions      CHL IP FOLLOW UP RECOMMENDATIONS 05/09/2015  Follow up Recommendations None      CHL IP FREQUENCY AND DURATION 05/09/2015  Speech Therapy Frequency (ACUTE ONLY) min 1 x/week  Treatment Duration --  CHL IP ORAL PHASE 05/09/2015  Oral Phase WFL  Oral - Pudding Teaspoon --  Oral - Pudding Cup --  Oral - Honey Teaspoon --  Oral - Honey Cup --  Oral - Nectar Teaspoon --  Oral - Nectar Cup --  Oral - Nectar Straw --  Oral - Thin Teaspoon --  Oral - Thin Cup --  Oral - Thin Straw --  Oral  - Puree --  Oral - Mech Soft --  Oral - Regular --  Oral - Multi-Consistency --  Oral - Pill --  Oral Phase - Comment --    CHL IP PHARYNGEAL PHASE 05/09/2015  Pharyngeal Phase WFL;Impaired  Pharyngeal- Pudding Teaspoon --  Pharyngeal --  Pharyngeal- Pudding Cup --  Pharyngeal --  Pharyngeal- Honey Teaspoon --  Pharyngeal --  Pharyngeal- Honey Cup --  Pharyngeal --  Pharyngeal- Nectar Teaspoon --  Pharyngeal --  Pharyngeal- Nectar Cup --  Pharyngeal --  Pharyngeal- Nectar Straw --  Pharyngeal --  Pharyngeal- Thin Teaspoon --  Pharyngeal --  Pharyngeal- Thin Cup Delayed swallow initiation-vallecula  Pharyngeal --  Pharyngeal- Thin Straw Delayed swallow initiation-vallecula;Pharyngeal residue - valleculae  Pharyngeal --  Pharyngeal- Puree Delayed swallow initiation-vallecula  Pharyngeal --  Pharyngeal- Mechanical Soft --  Pharyngeal --  Pharyngeal- Regular WFL  Pharyngeal --  Pharyngeal- Multi-consistency --  Pharyngeal --  Pharyngeal- Pill WFL  Pharyngeal --  Pharyngeal Comment --     CHL IP CERVICAL ESOPHAGEAL PHASE 05/09/2015  Cervical Esophageal Phase WFL  Pudding Teaspoon --  Pudding Cup --  Honey Teaspoon --  Honey Cup --  Nectar Teaspoon --  Nectar Cup --  Nectar Straw --  Thin Teaspoon --  Thin Cup --  Thin Straw --  Puree --  Mechanical Soft --  Regular --  Multi-consistency --  Pill --  Cervical Esophageal Comment --    Thank you,  Genene Churn, [place]  PORTER,[place] 05/09/2015, 5:42 PM

## 2015-05-10 ENCOUNTER — Other Ambulatory Visit (HOSPITAL_COMMUNITY): Payer: Self-pay

## 2015-05-10 ENCOUNTER — Encounter (HOSPITAL_COMMUNITY): Payer: Self-pay

## 2015-05-10 DIAGNOSIS — C3431 Malignant neoplasm of lower lobe, right bronchus or lung: Secondary | ICD-10-CM

## 2015-05-10 DIAGNOSIS — R1310 Dysphagia, unspecified: Secondary | ICD-10-CM

## 2015-05-10 DIAGNOSIS — J189 Pneumonia, unspecified organism: Principal | ICD-10-CM

## 2015-05-10 DIAGNOSIS — I959 Hypotension, unspecified: Secondary | ICD-10-CM

## 2015-05-10 DIAGNOSIS — G893 Neoplasm related pain (acute) (chronic): Secondary | ICD-10-CM

## 2015-05-10 LAB — BASIC METABOLIC PANEL
Anion gap: 4 — ABNORMAL LOW (ref 5–15)
BUN: 10 mg/dL (ref 6–20)
CALCIUM: 7.9 mg/dL — AB (ref 8.9–10.3)
CHLORIDE: 106 mmol/L (ref 101–111)
CO2: 24 mmol/L (ref 22–32)
CREATININE: 0.62 mg/dL (ref 0.61–1.24)
GFR calc non Af Amer: >60 mL/min (ref >60)
Glucose, Bld: 90 mg/dL (ref 65–99)
Potassium: 3.7 mmol/L (ref 3.5–5.1)
SODIUM: 134 mmol/L — AB (ref 135–145)

## 2015-05-10 MED ORDER — VITAMIN B-12 1000 MCG PO TABS
1000.0000 ug | ORAL_TABLET | Freq: Every day | ORAL | Status: DC
  Administered 2015-05-10 – 2015-05-12 (×3): 1000 ug via ORAL
  Filled 2015-05-10 (×3): qty 1

## 2015-05-10 MED ORDER — SODIUM CHLORIDE 0.9 % IV BOLUS (SEPSIS)
500.0000 mL | Freq: Once | INTRAVENOUS | Status: AC
  Administered 2015-05-10: 500 mL via INTRAVENOUS

## 2015-05-10 MED ORDER — SODIUM CHLORIDE 0.9 % IJ SOLN
10.0000 mL | Freq: Two times a day (BID) | INTRAMUSCULAR | Status: DC
  Administered 2015-05-12: 10 mL

## 2015-05-10 MED ORDER — SODIUM CHLORIDE 0.9 % IJ SOLN
10.0000 mL | INTRAMUSCULAR | Status: DC | PRN
  Administered 2015-05-10: 20 mL
  Filled 2015-05-10: qty 40

## 2015-05-10 MED ORDER — OXYCODONE HCL 5 MG PO TABS
15.0000 mg | ORAL_TABLET | ORAL | Status: DC | PRN
  Administered 2015-05-10 – 2015-05-12 (×4): 15 mg via ORAL
  Filled 2015-05-10 (×4): qty 3

## 2015-05-10 NOTE — Progress Notes (Signed)
Peripherally Inserted Central Catheter/Midline Placement  The IV Nurse has discussed with the patient and/or persons authorized to consent for the patient, the purpose of this procedure and the potential benefits and risks involved with this procedure.  The benefits include less needle sticks, lab draws from the catheter and patient may be discharged home with the catheter.  Risks include, but not limited to, infection, bleeding, blood clot (thrombus formation), and puncture of an artery; nerve damage and irregular heat beat.  Alternatives to this procedure were also discussed.  PICC/Midline Placement Documentation    PICC tip confirmed to be SVC  Using [employer].   Charm Barges S 05/10/2015, 2:36 PM

## 2015-05-10 NOTE — Progress Notes (Signed)
[facility] PROGRESS NOTE  Barry Cook DXA:[id number] DOB: 1955/10/31 DOA: 05/08/2015  PCP: Does not have a PCP.  Brief HPI:  59 year old Caucasian male with a past medical history of metastatic lung cancer, status post palliative radiotherapy and now on immunotherapy with Keytruda with next dose due on December 19 presented with hiccups, chest pain, cough. He was found to have findings on CT scan suggestive of pneumonia. He was hospitalized for further management.  Past medical history:  Past Medical History  Diagnosis Date  . Cancer [facility])     Lung    Consultants: None  Procedures: None yet  Antibiotics: Vancomycin and Zosyn  Subjective: Patient feels slightly better this morning. He tells me that he is able to swallow better. Still has cough with yellowish expectoration. Chest pain on the right side is slightly better. No nausea, vomiting. His wife is at the bedside  Objective: Vital Signs  Filed Vitals:   05/09/15 0213 05/09/15 1514 05/09/15 2100 05/10/15 0528  BP: 103/59 1'02/57 89/57 91/55 '$  Pulse: 83 60 46 64  Temp: 98.8 F (37.1 C) 99.3 F (37.4 C) 99.7 F (37.6 C) 98.7 F (37.1 C)  TempSrc: Oral Oral Oral Oral  Resp: '18 18 20 20  '$ Height:      Weight:      SpO2: 98% 97% 92% 96%    Intake/Output Summary (Last 24 hours) at 05/10/15 1122 Last data filed at 05/10/15 2094  Gross per 24 hour  Intake    800 ml  Output      0 ml  Net    800 ml   Filed Weights   05/08/15 2037  Weight: 68.947 kg (152 lb)    General appearance: alert, cooperative, appears stated age and no distress Resp: Coarse breath sounds bilaterally with rhonchi on the right. Few crackles heard as well. Few scattered wheezes. Cardio: regular rate and rhythm, S1, S2 normal, no murmur, click, rub or gallop GI: soft, non-tender; bowel sounds normal; no masses,  no organomegaly Extremities: extremities normal, atraumatic, no cyanosis or edema Pulses: 2+ and symmetric Neurologic:  Alert and oriented 3. No focal neurological deficits.  Lab Results:  Basic Metabolic Panel:  Recent Labs Lab 05/08/15 2125 05/10/15 0950  NA 130* 134*  K 4.0 3.7  CL 95* 106  CO2 25 24  GLUCOSE 97 90  BUN 17 10  CREATININE 0.77 0.62  CALCIUM 9.0 7.9*   CBC:  Recent Labs Lab 05/08/15 2125  WBC 6.6  HGB 8.8*  HCT 26.5*  MCV 87.2  PLT 192   Cardiac Enzymes:  Recent Labs Lab 05/08/15 2125 05/09/15 0145 05/09/15 0758 05/09/15 1312  TROPONINI <0.03 <0.03 <0.03 <0.03     Studies/Results: Dg Chest 2 View  05/08/2015  CLINICAL DATA:  PICC absent indigestion for 3 days. Chest pain. History of lung cancer. Former smoker. EXAM: CHEST  2 VIEW COMPARISON:  CT chest 03/16/2015.  Chest 03/07/2015 FINDINGS: Cavitary mass again demonstrated in the right cardiophrenic angle posteriorly. There is now fluid within the mass and an air-fluid level is demonstrated. Possible right paratracheal and right hilar lymphadenopathy. Normal heart size and pulmonary vascularity. Calcified and tortuous aorta. Diffuse interstitial pattern to the lungs is increased since previous study, possibly indicating interstitial edema, interstitial pneumonia, or interstitial tumor spread. Hyperinflation consistent with underlying emphysema. No pneumothorax. Old left rib fracture. IMPRESSION: Cavitary mass in the right lung base posteriorly now contains an air-fluid level. Probable lymphadenopathy in the right paratracheal and right hilar regions.  Increasing interstitial pattern to the lungs may indicate developing interstitial edema, interstitial pneumonia, or interstitial tumor spread. Electronically Signed   By: Lucienne Capers M.D.   On: 05/08/2015 23:12   Ct Angio Chest Pe W/cm &/or Wo Cm  05/09/2015  CLINICAL DATA:  Acute onset of generalized chest pain and shortness of breath. Current history of lung cancer. Initial encounter. EXAM: CT ANGIOGRAPHY CHEST WITH CONTRAST TECHNIQUE: Multidetector CT imaging of  the chest was performed using the standard protocol during bolus administration of intravenous contrast. Multiplanar CT image reconstructions and MIPs were obtained to evaluate the vascular anatomy. CONTRAST:  155m OMNIPAQUE IOHEXOL 350 MG/ML SOLN COMPARISON:  CT of the chest performed 03/16/2015, and PET/CT performed 03/28/2015 FINDINGS: There is no evidence of pulmonary embolus. The large cavitary mass at the right lower lobe is similar in size to the prior study, measuring approximately 8.1 x 7.5 cm, but there is now a large amount of fluid within the mass, raising concern for underlying infection. There is increasing airspace opacification at the right lung base, concerning for postobstructive pneumonia. An increasing small right pleural effusion is noted, likely reflecting a malignant effusion. The left lung remains clear.  No pneumothorax is seen. Marked mediastinal lymphadenopathy is perhaps slightly improved, with a mass measuring 3.7 cm in short axis at the azygoesophageal region, and another mass measuring 3.1 cm in short axis at the right paratracheal region. Right hilar lymphadenopathy is also noted. No significant pericardial effusion is identified. Scattered calcification is noted along the thoracic aorta and proximal great vessels. No axillary lymphadenopathy is seen. The visualized portions of the thyroid gland are unremarkable in appearance. A 2.2 cm metastasis is again noted within the spleen. Visualized portions of the liver are grossly unremarkable. Bilateral large adrenal masses are again noted, measuring approximately 5.2 cm on the right and 3.6 cm on the left. No acute osseous abnormalities are seen. Review of the MIP images confirms the above findings. IMPRESSION: 1. No evidence of pulmonary embolus. 2. Large cavitary mass at the right lower lobe is similar in size, measuring 8.1 x 7.5 cm, but there is now a large amount of fluid within the mass, concerning for underlying infection.  Increasing airspace opacification at the right lung base, concerning for postobstructive pneumonia. 3. Increasing small right pleural effusion, likely reflecting a malignant effusion. 4. Marked mediastinal lymphadenopathy is perhaps slightly improved, with masses measuring up to 3.7 cm in short axis. Right hilar lymphadenopathy also seen. 5. Adrenal and splenic metastases again noted. Electronically Signed   By: JGarald BaldingM.D.   On: 05/09/2015 01:47   Dg Swallowing Func-speech Pathology  05/09/2015  DEphraim Hamburger CCC-SLP     05/09/2015  5:43 PM Objective Swallowing Evaluation: MBS-Modified Barium Swallow Study Patient Details Name: WKAHLIL COWANSMRN: [id number] of Birth: [id number]'s Date: 05/09/2015 Time: SLP Start Time (ACUTE ONLY): 1414-SLP Stop Time (ACUTE ONLY): 1430 SLP Time Calculation (min) (ACUTE ONLY): 16 min Past Medical History: Past Medical History Diagnosis Date . [place]    Lung Past Surgical History: History reviewed. No pertinent past surgical history. HPI: WKORDELL JAFRIis an 59y.o. 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 He has no fever, chills, or coughs. He denied SOB, but admitted to having trouble with swallowing. Evlaution  in the ER included a Hb of 8.8 g per dL, a drop from prior normal Hb. He has no black stool or bloody stool, and has no evidence of bleeding. EKG showed no acute ST T changes, and troponin was normal. He had a CTA which was negative for PE, confirming the present of his large lung mass, and also suggestive of PNA. He was given IV Zosyn in the ER, and hospitalist was asked to admit him for chest pain, anemia, nausea, vomting, and hiccups. He normally sees Dr Curt Bears at [facility] Subjective: "Doing  alright" Assessment / Plan / Recommendation CHL IP CLINICAL IMPRESSIONS 05/09/2015 Therapy Diagnosis WFL Clinical Impression MBSS completed this date. Swallow function essentially WFL. No penetration or aspiration observed. Pt with mild vallecular residue, but clears with repeat/dry swallow. No stasis noted in pharynx. Barium tablet became transiently delayed in distal esophagus near GE junction, but passed after sips of water and bite of applesauce. Pt with some belching after completion of test. SLP reinforced need for pt to continue with oral diet to help combat the effects of radiation therapy long term. Given normal swallow function in today's study, prandial aspiration is doubtful. Pt does endorse heartburn and some heaving due to hiccups so it is possible that pt aspirated post prandial and/or at night. Pt reports elevating HOB at night which he will continue. Recommend continuing soft diet as ordered as pt reports some difficulty with meats/breads; ok for thin liquids. SLP will follow up X1 for diet tolerance and ongoing education as needed. Pt in agreement with plan of care.  Impact on safety and function No limitations   CHL IP TREATMENT RECOMMENDATION 05/09/2015 Treatment Recommendations Therapy as outlined in treatment plan below   Prognosis 05/09/2015 Prognosis for Safe Diet Advancement Good Barriers to Reach Goals -- Barriers/Prognosis Comment -- CHL IP DIET RECOMMENDATION 05/09/2015 SLP Diet Recommendations Regular solids;Dysphagia 3 (Mech soft) solids;Thin liquid Liquid Administration via Cup;Straw Medication Administration Whole meds with liquid Compensations Multiple dry swallows after each bite/sip Postural Changes Remain semi-upright after after feeds/meals (Comment);Seated upright at 90 degrees   CHL IP OTHER RECOMMENDATIONS 05/09/2015 Recommended Consults -- Oral Care Recommendations Oral care BID Other Recommendations Clarify dietary restrictions   CHL IP FOLLOW UP RECOMMENDATIONS 05/09/2015  Follow up Recommendations None   CHL IP FREQUENCY AND DURATION 05/09/2015 Speech Therapy Frequency (ACUTE ONLY) min 1 x/week Treatment Duration --      CHL IP ORAL PHASE 05/09/2015 Oral Phase WFL Oral - Pudding Teaspoon -- Oral - Pudding Cup -- Oral - Honey Teaspoon -- Oral - Honey Cup -- Oral - Nectar Teaspoon -- Oral - Nectar Cup -- Oral - Nectar Straw -- Oral - Thin Teaspoon -- Oral - Thin Cup -- Oral - Thin Straw -- Oral - Puree -- Oral - Mech Soft -- Oral - Regular -- Oral - Multi-Consistency -- Oral - Pill -- Oral Phase - Comment --  CHL IP PHARYNGEAL PHASE 05/09/2015 Pharyngeal Phase WFL;Impaired Pharyngeal- Pudding Teaspoon -- Pharyngeal -- Pharyngeal- Pudding Cup -- Pharyngeal -- Pharyngeal- Honey Teaspoon -- Pharyngeal -- Pharyngeal- Honey Cup -- Pharyngeal -- Pharyngeal- Nectar Teaspoon -- Pharyngeal -- Pharyngeal- Nectar Cup -- Pharyngeal -- Pharyngeal- Nectar Straw -- Pharyngeal -- Pharyngeal- Thin Teaspoon -- Pharyngeal -- Pharyngeal- Thin Cup Delayed swallow initiation-vallecula Pharyngeal -- Pharyngeal- Thin Straw Delayed swallow initiation-vallecula;Pharyngeal residue - valleculae Pharyngeal -- Pharyngeal- Puree Delayed swallow initiation-vallecula Pharyngeal -- Pharyngeal- Mechanical Soft -- Pharyngeal -- Pharyngeal- Regular WFL Pharyngeal -- Pharyngeal- Multi-consistency -- Pharyngeal -- Pharyngeal- Pill  WFL Pharyngeal -- Pharyngeal Comment --  CHL IP CERVICAL ESOPHAGEAL PHASE 05/09/2015 Cervical Esophageal Phase WFL Pudding Teaspoon -- Pudding Cup -- Honey Teaspoon -- Honey Cup -- Nectar Teaspoon -- Nectar Cup -- Nectar Straw -- Thin Teaspoon -- Thin Cup -- Thin Straw -- Puree -- Mechanical Soft -- Regular -- Multi-consistency -- Pill -- Cervical Esophageal Comment -- Thank you, Genene Churn, CCC-SLP [phone] PORTER,DABNEY 05/09/2015, 5:42 PM               Medications:  Scheduled: . enoxaparin (LOVENOX) injection  40 mg Subcutaneous Q24H  . piperacillin-tazobactam (ZOSYN)  IV  3.375 g  Intravenous Q8H  . polyethylene glycol  17 g Oral BID  . sodium chloride  3 mL Intravenous Q12H  . vancomycin  750 mg Intravenous Q8H  . vitamin B-12  1,000 mcg Oral Daily   Continuous: . 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 Continue vancomycin and Zosyn for now. Sputum culture will be ordered. Patient feels better symptomatically. Continue to monitor closely.  Hypotension Blood pressure noted to be on the lower side. He is not on any blood pressure lowering agent. Denies any dizziness, lightheadedness, however, does complain of fatigue. Give fluid bolus. Check cortisol level tomorrow morning.  Metastatic lung cancer Patient is on immunotherapy. His next dose is due on November 19. He is followed by Dr. Julien Nordmann in [place]. He was also offered hospice and palliative care during his last visit.  Dysphagia Reason is unclear. Could be from cervical metastatic lesion. He did undergo a swallow evaluation which did not reveal any significant abnormality. He was having hiccups as well. That has resolved. I did offer a barium swallow. However, patient declines at this time. He states that his swallowing is much better. Continue to monitor for now. Continue soft diet.  Cancer related pain Continue narcotics.  Anemia due to chronic disease Monitor hemoglobin. Patient denies any bleeding.  DVT Prophylaxis: Lovenox    Code Status: Full code  Family Communication: Discussed with the patient and his wife  Disposition  Plan: Continue management as outlined above.     LOS: 1 day   [place] [facility] Pager [phone] 05/10/2015, 11:22 AM  If 7PM-7AM, please contact night-coverage at [email or web address], password [facility]

## 2015-05-10 NOTE — Care Management Note (Signed)
Case Management Note  Patient Details  Name: Barry Cook MRN: [id number] Date of Birth: 1955-09-23  Subjective/Objective:                  Pt admitted from home with pneumonia. Pt lives with his wife and will return home at discharge. Pt is independent with ADL's. Pt has home O2 in place with [facility] and wears it at night.  Action/Plan: Financial counselor is aware of self pay status. Pt given information on [place] [employer]ations Assistance program. Will continue to follow for discharge planning needs.  Expected Discharge Date:                  Expected Discharge Plan:  Home/Self Care  In-House Referral:  Financial Counselor  Discharge planning Services  CM Consult  Post Acute Care Choice:  NA Choice offered to:  NA  DME Arranged:    DME Agency:     HH Arranged:    [place] Agency:     Status of Service:  Completed, signed off  Medicare Important Message Given:    Date Medicare IM Given:    Medicare IM give by:    Date Additional Medicare IM Given:    Additional Medicare Important Message give by:     If discussed at [place] of Stay Meetings, dates discussed:    Additional Comments:  Joylene Draft, RN 05/10/2015, 2:59 PM

## 2015-05-11 DIAGNOSIS — C3432 Malignant neoplasm of lower lobe, left bronchus or lung: Secondary | ICD-10-CM

## 2015-05-11 DIAGNOSIS — C7970 Secondary malignant neoplasm of unspecified adrenal gland: Secondary | ICD-10-CM

## 2015-05-11 LAB — BASIC METABOLIC PANEL
ANION GAP: 3 — AB (ref 5–15)
BUN: 8 mg/dL (ref 6–20)
CO2: 24 mmol/L (ref 22–32)
Calcium: 7.9 mg/dL — ABNORMAL LOW (ref 8.9–10.3)
Chloride: 108 mmol/L (ref 101–111)
Creatinine, Ser: 0.62 mg/dL (ref 0.61–1.24)
Glucose, Bld: 93 mg/dL (ref 65–99)
POTASSIUM: 4.1 mmol/L (ref 3.5–5.1)
SODIUM: 135 mmol/L (ref 135–145)

## 2015-05-11 LAB — CBC
HCT: 27.9 % — ABNORMAL LOW (ref 39.0–52.0)
HEMOGLOBIN: 9.1 g/dL — AB (ref 13.0–17.0)
MCH: 28.9 pg (ref 26.0–34.0)
MCHC: 32.6 g/dL (ref 30.0–36.0)
MCV: 88.6 fL (ref 78.0–100.0)
PLATELETS: 197 10*3/uL (ref 150–400)
RBC: 3.15 MIL/uL — AB (ref 4.22–5.81)
RDW: 13.4 % (ref 11.5–15.5)
WBC: 4.7 10*3/uL (ref 4.0–10.5)

## 2015-05-11 LAB — EXPECTORATED SPUTUM ASSESSMENT W REFEX TO RESP CULTURE

## 2015-05-11 LAB — EXPECTORATED SPUTUM ASSESSMENT W GRAM STAIN, RFLX TO RESP C

## 2015-05-11 LAB — CORTISOL-AM, BLOOD: CORTISOL - AM: 7.1 ug/dL (ref 6.7–22.6)

## 2015-05-11 LAB — VANCOMYCIN, TROUGH: VANCOMYCIN TR: 7 ug/mL — AB (ref 10.0–20.0)

## 2015-05-11 MED ORDER — VANCOMYCIN HCL IN DEXTROSE 1-5 GM/200ML-% IV SOLN
1000.0000 mg | Freq: Three times a day (TID) | INTRAVENOUS | Status: DC
  Administered 2015-05-11 – 2015-05-12 (×3): 1000 mg via INTRAVENOUS
  Filled 2015-05-11 (×3): qty 200

## 2015-05-11 NOTE — Progress Notes (Signed)
[facility] PROGRESS NOTE  Barry Cook QIH:[id number] DOB: 1955/11/16 DOA: 05/08/2015 PCP: No PCP Per Patient  Assessment/Plan: Postobstructive pneumonia -Afebrile, leukocytosis resolved, we'll continue broad-spectrum antibiotics for another 24 hours and will consider narrowing in a.m. if clinical data is favorable.  Hypotension -BP is low normal, not symptomatic, not on any hypotensive agents.  Metastatic lung cancer -Continue outpatient follow-up with oncology, Dr. Julien Nordmann in [place].  Cancer related pain -Continue narcotics  Chronic disease anemia -No indication for transfusion at present.  Code Status: Full code Family Communication: Patient only  Disposition Plan: Anticipate discharge home in 24-48 hours   Consultants:  None   Antibiotics:  Vancomycin  Zosyn   Subjective: Feels improved, not dizzy, no chest pain, mild shortness of breath with ambulation  Objective: Filed Vitals:   05/10/15 2154 05/11/15 0535 05/11/15 1337 05/11/15 1637  BP: 100/55 101/61 98/58   Pulse: 57 62 64   Temp: 98.9 F (37.2 C) 98.1 F (36.7 C) 98.5 F (36.9 C)   TempSrc: Oral Oral Oral   Resp: '20 20 20   '$ Height:      Weight:      SpO2: 97% 97% 98% 93%    Intake/Output Summary (Last 24 hours) at 05/11/15 1713 Last data filed at 05/11/15 1509  Gross per 24 hour  Intake 1587.5 ml  Output      0 ml  Net 1587.5 ml   Filed Weights   05/08/15 2037  Weight: 68.947 kg (152 lb)    Exam:   General:  Alert, awake, oriented 3  Cardiovascular: Regular rate and rhythm  Respiratory: Clear to auscultation bilaterally  Abdomen: Soft, nontender, nondistended, positive bowel sounds  Extremities: No clubbing, cyanosis or edema   Neurologic:  Grossly intact and nonfocal  Data Reviewed: Basic Metabolic Panel:  Recent Labs Lab 05/08/15 2125 05/10/15 0950 05/11/15 0544  NA 130* 134* 135  K 4.0 3.7 4.1  CL 95* 106 108  CO2 '25 24 24  '$ GLUCOSE 97 90  93  BUN '17 10 8  '$ CREATININE 0.77 0.62 0.62  CALCIUM 9.0 7.9* 7.9*   Liver Function Tests: No results for input(s): AST, ALT, ALKPHOS, BILITOT, PROT, ALBUMIN in the last 168 hours. No results for input(s): LIPASE, AMYLASE in the last 168 hours. No results for input(s): AMMONIA in the last 168 hours. CBC:  Recent Labs Lab 05/08/15 2125 05/11/15 0544  WBC 6.6 4.7  HGB 8.8* 9.1*  HCT 26.5* 27.9*  MCV 87.2 88.6  PLT 192 197   Cardiac Enzymes:  Recent Labs Lab 05/08/15 2125 05/09/15 0145 05/09/15 0758 05/09/15 1312  TROPONINI <0.03 <0.03 <0.03 <0.03   BNP (last 3 results) No results for input(s): BNP in the last 8760 hours.  ProBNP (last 3 results) No results for input(s): PROBNP in the last 8760 hours.  CBG: No results for input(s): GLUCAP in the last 168 hours.  Recent Results (from the past 240 hour(s))  Culture, expectorated sputum-assessment     Status: None   Collection Time: 05/10/15  8:33 PM  Result Value Ref Range Status   Specimen Description SPUTUM  Final   Special Requests Immunocompromised  Final   Sputum evaluation   Final    THIS SPECIMEN IS ACCEPTABLE FOR SPUTUM CULTURE [facility]    Report Status 05/10/2015 FINAL  Final     Studies: No results found.  Scheduled Meds: . enoxaparin (LOVENOX) injection  40 mg Subcutaneous Q24H  . piperacillin-tazobactam (ZOSYN)  IV  3.375 g Intravenous  Q8H  . polyethylene glycol  17 g Oral BID  . sodium chloride  10-40 mL Intracatheter Q12H  . sodium chloride  3 mL Intravenous Q12H  . vancomycin  1,000 mg Intravenous Q8H  . vitamin B-12  1,000 mcg Oral Daily   Continuous Infusions: . 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 Greater than 50% of this time was spent in  direct contact with the patient coordinating care.    Lelon Frohlich  [facility] Pager [phone]  If 7PM-7AM, please contact night-coverage at [email or web address], password [facility] 05/11/2015, 5:13 PM  LOS: 2 days

## 2015-05-11 NOTE — Progress Notes (Signed)
ANTIBIOTIC CONSULT NOTE - INITIAL  Pharmacy Consult for Vancomycin and Zosyn Indication: pneumonia  No Known Allergies  Patient Measurements: Height: '6\' 1"'$  (185.4 cm) Weight: 152 lb (68.947 kg) IBW/kg (Calculated) : 79.9  Vital Signs: Temp: 98.5 F (36.9 C) (12/15 1337) Temp Source: Oral (12/15 1337) BP: 98/58 mmHg (12/15 1337) Pulse Rate: 64 (12/15 1337) Intake/Output from previous day: 12/14 0701 - 12/15 0700 In: 2992.5 [P.O.:1440; I.V.:852.5; IV Piggyback:700] Out: -  Intake/Output from this shift: Total I/O In: 523.3 [I.V.:423.3; IV Piggyback:100] Out: -   Labs:  Recent Labs  05/08/15 2125 05/10/15 0950 05/11/15 0544  WBC 6.6  --  4.7  HGB 8.8*  --  9.1*  PLT 192  --  197  CREATININE 0.77 0.62 0.62   Estimated Creatinine Clearance: 95.7 mL/min (by C-G formula based on Cr of 0.62). No results for input(s): VANCOTROUGH, VANCOPEAK, VANCORANDOM, GENTTROUGH, GENTPEAK, GENTRANDOM, TOBRATROUGH, TOBRAPEAK, TOBRARND, AMIKACINPEAK, AMIKACINTROU, AMIKACIN in the last 72 hours.   Microbiology: Recent Results (from the past 720 hour(s))  Culture, expectorated sputum-assessment     Status: None   Collection Time: 05/10/15  8:33 PM  Result Value Ref Range Status   Specimen Description SPUTUM  Final   Special Requests Immunocompromised  Final   Sputum evaluation   Final    THIS SPECIMEN IS ACCEPTABLE FOR SPUTUM CULTURE [facility]    Report Status 05/10/2015 FINAL  Final    Medical History: Past Medical History  Diagnosis Date  . Cancer ([place])     Lung   Anti-infectives    Start     Dose/Rate Route Frequency Ordered Stop   05/09/15 1000  vancomycin (VANCOCIN) IVPB 750 mg/150 ml premix    Comments:  (retrieve bag from PYXIS machine)   750 mg 150 mL/hr over 60 Minutes Intravenous Every 8 hours 05/09/15 0750     05/09/15 0800  piperacillin-tazobactam (ZOSYN) IVPB 3.375 g     3.375 g 12.5 mL/hr over 240 Minutes Intravenous Every 8 hours 05/09/15 0747     05/09/15 0315   vancomycin (VANCOCIN) IVPB 750 mg/150 ml premix     750 mg 150 mL/hr over 60 Minutes Intravenous  Once 05/09/15 0301 05/09/15 0415   05/09/15 0015  piperacillin-tazobactam (ZOSYN) IVPB 3.375 g     3.375 g 100 mL/hr over 30 Minutes Intravenous  Once 05/09/15 0008 05/09/15 0045     Assessment: 59 y.o. 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 He has no fever, chills, or coughs.Ct scan suggestive of PNA.  Sputum cx pending from 12/14. Continues to be afebrile. Labs stable.  Vancomycin trough level= 6mg/ml. Level drawn late. Will increase dose and recheck in 48 hours if warranted  Goal of Therapy:  Vancomycin trough level 15-20 mcg/ml  Plan:  Zosyn 3.375gm IV q8h, EID Increase Vancomycin '1000mg'$  IV q8h ReCheck trough at steady state Monitor labs, renal fxn, progress and c/s Deescalate ABX when improved / appropriate  LIsac Sarna BS PVena [place] BCPS Clinical Pharmacist Pager #[phone]/15/2016,3:32 PM

## 2015-05-11 NOTE — Progress Notes (Signed)
Speech Language Pathology Treatment: Dysphagia  Patient Details Name: Barry Cook MRN: [id number] DOB: July 23, 1955 Today's Date: 05/11/2015 Time: 1216-1225 SLP Time Calculation (min) (ACUTE ONLY): 9 min  Assessment / Plan / Recommendation Clinical Impression  Barry Cook was seen briefly for education following his MBSS on Tuesday. SLP reinforced results of MBSS (normal), but advised pt that long term effects of radiation can impact safe and efficient swallow. Pt verbalizes understanding. Pt encouraged to continue with PO diet and sip on water throughout day. Pt had just finished his lunch tray without complaint. No further SLP follow up indicated at this time. Pt encouraged to notify his doctor should he note changes in swallow post discharge.   HPI HPI: Barry Cook is an 59 y.o. 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 He has no fever, chills, or coughs. He denied SOB, but admitted to having trouble with swallowing. Evlaution in the ER included a Hb of 8.8 g per dL, a drop from prior normal Hb. He has no black stool or bloody stool, and has no evidence of bleeding. EKG showed no acute ST T changes, and troponin was normal. He had a CTA which was negative for PE, confirming the present of his large lung mass, and also suggestive of PNA. He was given IV Zosyn in the ER, and hospitalist was asked to admit him for chest pain, anemia, nausea, vomting, and hiccups. He normally sees Barry Cook at [place]  All goals met;Discharge SLP treatment due to (comment)     Recommendations  Diet recommendations: Regular;Thin liquid Liquids provided via: Cup;Straw Medication Administration: Whole meds with liquid Supervision: Patient able to self  feed Compensations: Multiple dry swallows after each bite/sip Postural Changes and/or Swallow Maneuvers: Out of bed for meals;Seated upright 90 degrees;Upright 30-60 min after meal              Oral Care Recommendations: Oral care BID Follow up Recommendations: None Plan: All goals met;Discharge SLP treatment due to (comment)  Thank you,  Barry Cook, [place]  [place] 05/11/2015, 12:28 PM

## 2015-05-12 DIAGNOSIS — J189 Pneumonia, unspecified organism: Secondary | ICD-10-CM

## 2015-05-12 MED ORDER — OXYCODONE HCL 15 MG PO TABS: 15.0000 mg | ORAL_TABLET | ORAL | Status: DC | PRN

## 2015-05-12 MED ORDER — LEVOFLOXACIN 750 MG PO TABS
750.0000 mg | ORAL_TABLET | Freq: Every day | ORAL | Status: DC
Start: 2015-05-12 — End: 2015-05-30

## 2015-05-12 NOTE — Care Management Note (Signed)
Case Management Note  Patient Details  Name: Barry Cook MRN: [id number] Date of Birth: 10/07/1955  Subjective/Objective:                    Action/Plan:   Expected Discharge Date:                  Expected Discharge Plan:  Home/Self Care  In-House Referral:  Financial Counselor  Discharge planning Services  CM Consult  Post Acute Care Choice:  NA Choice offered to:  NA  DME Arranged:    DME Agency:     HH Arranged:    [place] Agency:     Status of Service:  Completed, signed off  Medicare Important Message Given:    Date Medicare IM Given:    Medicare IM give by:    Date Additional Medicare IM Given:    Additional Medicare Important Message give by:     If discussed at [place] of Stay Meetings, dates discussed:    Additional Comments: No CM needs noted. Pt for discharge home today.  Christinia Gully [place], RN 05/12/2015, 1:31 PM

## 2015-05-12 NOTE — Progress Notes (Signed)
Patient discharged home.  PICC removed - instructed to leave dressing in place for 24 hours.  Reviewed DC instructions and medications.  Instructed to follow up with regular MD.  Barry Cook understanding.  No questions at this time.  Assisted off unit in stable condition via WC.

## 2015-05-12 NOTE — Discharge Summary (Signed)
Physician Discharge Summary  Barry Cook TFT:[id number] DOB: 12/16/1955 DOA: 05/08/2015  PCP: No PCP Per Patient  Admit date: 05/08/2015 Discharge date: 05/12/2015  Time spent: 45 minutes  Recommendations for Outpatient Follow-up:  -We'll be discharged home today. -Advised to follow-up with primary care physician within 2 weeks and with radiation oncologist as scheduled for his next radiation session.   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 Marin Comment on 12/13: Barry Cook is an 59 y.o. 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 He has no fever, chills, or coughs. He denied SOB, but admitted to having trouble with swallowing. Evlaution in the ER included a Hb of 8.8 g per dL, a drop from prior normal Hb. He has no black stool or bloody stool, and has no evidence of bleeding. EKG showed no acute ST T changes, and troponin was normal. He had a CTA which was negative for PE, confirming the present of his large lung mass, and also suggestive of PNA. He was given IV Zosyn in the ER, and hospitalist was asked to admit him for chest pain, anemia, nausea, vomting, and hiccups. He normally sees Dr Curt Bears at [facility].   Hospital Course:   Postobstructive pneumonia -Afebrile, leukocytosis resolved. -Has been on broad-spectrum antibiotics for 3 days, all culture data remains negative. -We'll transition over to Levaquin  to complete 7 days of treatment.  Hypotension -BP is low normal, not symptomatic, not on any hypotensive agents.  Metastatic lung cancer -Continue outpatient follow-up with oncology, Dr. Julien Nordmann in [place].  Cancer related pain -Continue narcotics  Chronic disease anemia -No indication for transfusion at present.   Procedures:  None   Consultations:  None  Discharge Instructions  Discharge Instructions    Increase activity slowly    Complete by:  As directed             Medication List    STOP taking these medications        oxyCODONE-acetaminophen 5-325 MG tablet  Commonly known as:  ROXICET      TAKE these medications        B-COMPLEX PO  Take 1 tablet by mouth daily.     levofloxacin 750 MG tablet  Commonly known as:  LEVAQUIN  Take 1 tablet (750 mg total) by mouth daily.     megestrol 625 MG/5ML suspension  Commonly known as:  MEGACE ES  Take 5 mLs (625 mg total) by mouth daily.     multivitamin with minerals Tabs tablet  Take 1 tablet by mouth daily.     oxyCODONE 15 MG immediate release tablet  Commonly known as:  ROXICODONE  Take 1 tablet (15 mg total) by mouth every 4 (four) hours as needed for pain.     polyethylene glycol packet  Commonly known as:  MIRALAX / GLYCOLAX  Take 17 g by mouth 2 (two) times daily.  traZODone 50 MG tablet  Commonly known as:  DESYREL  Take 1 tablet (50 mg total) by mouth at bedtime as needed and may repeat dose one time if needed for sleep.       No Known Allergies     Follow-up Information    Schedule an appointment as soon as possible for a visit in 2 weeks to follow up.   Why:  with your regular physician       The results of significant diagnostics from this hospitalization (including imaging, microbiology, ancillary and laboratory) are listed below for reference.    Significant Diagnostic Studies: Dg Chest 2 View  05/08/2015  CLINICAL DATA:  PICC absent indigestion for 3 days. Chest  pain. History of lung cancer. Former smoker. EXAM: CHEST  2 VIEW COMPARISON:  CT chest 03/16/2015.  Chest 03/07/2015 FINDINGS: Cavitary mass again demonstrated in the right cardiophrenic angle posteriorly. There is now fluid within the mass and an air-fluid level is demonstrated. Possible right paratracheal and right hilar lymphadenopathy. Normal heart size and pulmonary vascularity. Calcified and tortuous aorta. Diffuse interstitial pattern to the lungs is increased since previous study, possibly indicating interstitial edema, interstitial pneumonia, or interstitial tumor spread. Hyperinflation consistent with underlying emphysema. No pneumothorax. Old left rib fracture. IMPRESSION: Cavitary mass in the right lung base posteriorly now contains an air-fluid level. Probable lymphadenopathy in the right paratracheal and right hilar regions. Increasing interstitial pattern to the lungs may indicate developing interstitial edema, interstitial pneumonia, or interstitial tumor spread. Electronically Signed   By: Lucienne Capers M.D.   On: 05/08/2015 23:12   Ct Angio Chest Pe W/cm &/or Wo Cm  05/09/2015  CLINICAL DATA:  Acute onset of generalized chest pain and shortness of breath. Current history of lung cancer. Initial encounter. EXAM: CT ANGIOGRAPHY CHEST WITH CONTRAST TECHNIQUE: Multidetector CT imaging of the chest was performed using the standard protocol during bolus administration of intravenous contrast. Multiplanar CT image reconstructions and MIPs were obtained to evaluate the vascular anatomy. CONTRAST:  133m OMNIPAQUE IOHEXOL 350 MG/ML SOLN COMPARISON:  CT of the chest performed 03/16/2015, and PET/CT performed 03/28/2015 FINDINGS: There is no evidence of pulmonary embolus. The large cavitary mass at the right lower lobe is similar in size to the prior study, measuring approximately 8.1 x 7.5 cm, but there is now a large amount of fluid within the mass, raising concern for underlying infection. There is  increasing airspace opacification at the right lung base, concerning for postobstructive pneumonia. An increasing small right pleural effusion is noted, likely reflecting a malignant effusion. The left lung remains clear.  No pneumothorax is seen. Marked mediastinal lymphadenopathy is perhaps slightly improved, with a mass measuring 3.7 cm in short axis at the azygoesophageal region, and another mass measuring 3.1 cm in short axis at the right paratracheal region. Right hilar lymphadenopathy is also noted. No significant pericardial effusion is identified. Scattered calcification is noted along the thoracic aorta and proximal great vessels. No axillary lymphadenopathy is seen. The visualized portions of the thyroid gland are unremarkable in appearance. A 2.2 cm metastasis is again noted within the spleen. Visualized portions of the liver are grossly unremarkable. Bilateral large adrenal masses are again noted, measuring approximately 5.2 cm on the right and 3.6 cm on the left. No acute osseous abnormalities are seen. Review of the MIP images confirms the above findings. IMPRESSION: 1. No evidence of pulmonary embolus. 2. Large cavitary mass at the right lower lobe is similar in size, measuring 8.1 x 7.5  cm, but there is now a large amount of fluid within the mass, concerning for underlying infection. Increasing airspace opacification at the right lung base, concerning for postobstructive pneumonia. 3. Increasing small right pleural effusion, likely reflecting a malignant effusion. 4. Marked mediastinal lymphadenopathy is perhaps slightly improved, with masses measuring up to 3.7 cm in short axis. Right hilar lymphadenopathy also seen. 5. Adrenal and splenic metastases again noted. Electronically Signed   By: Garald Balding M.D.   On: 05/09/2015 01:47   Dg Swallowing Func-speech Pathology  05/09/2015  Ephraim Hamburger, CCC-SLP     05/09/2015  5:43 PM Objective Swallowing Evaluation: MBS-Modified Barium Swallow  Study Patient Details Name: Barry Cook MRN: [id number] Date of Birth: Sep 19, 1955 Today's Date: 05/09/2015 Time: SLP Start Time (ACUTE ONLY): 1414-SLP Stop Time (ACUTE ONLY): 1430 SLP Time Calculation (min) (ACUTE ONLY): 16 min Past Medical History: Past Medical History Diagnosis Date . Cancer ([place])    Lung Past Surgical History: History reviewed. No pertinent past surgical history. HPI: JEHAD BISONO is an 59 y.o. 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 He has no fever, chills, or coughs. He denied SOB, but admitted to having trouble with swallowing. Evlaution in the ER included a Hb of 8.8 g per dL, a drop from prior normal Hb. He has no black stool or bloody stool, and has no evidence of bleeding. EKG showed no acute ST T changes, and troponin was normal. He had a CTA which was negative for PE, confirming the present of his large lung mass, and also suggestive of PNA. He was given IV Zosyn in the ER, and hospitalist was asked to admit him for chest pain, anemia, nausea, vomting, and hiccups. He normally sees Dr Curt Bears at [facility] Subjective: "Doing alright" Assessment / Plan / Recommendation CHL IP CLINICAL IMPRESSIONS 05/09/2015 Therapy Diagnosis WFL Clinical Impression MBSS completed this date. Swallow function essentially WFL. No penetration or aspiration observed. Pt with mild vallecular residue, but clears with repeat/dry swallow. No stasis noted in pharynx. Barium tablet became transiently delayed in distal esophagus near GE junction, but passed after sips of water and bite of applesauce. Pt with some belching after completion of test. SLP reinforced need for pt to continue with oral diet to help combat the effects of radiation therapy long term. Given normal  swallow function in today's study, prandial aspiration is doubtful. Pt does endorse heartburn and some heaving due to hiccups so it is possible that pt aspirated post prandial and/or at night. Pt reports elevating HOB at night which he will continue. Recommend continuing soft diet as ordered as pt reports some difficulty with meats/breads; ok for thin liquids. SLP will follow up X1 for diet tolerance and ongoing education as needed. Pt in agreement with plan of care.  Impact on safety and function No limitations   CHL IP TREATMENT RECOMMENDATION 05/09/2015 Treatment Recommendations Therapy as outlined in treatment plan below   Prognosis 05/09/2015 Prognosis for Safe Diet Advancement Good Barriers to Reach Goals -- Barriers/Prognosis Comment -- CHL IP DIET RECOMMENDATION 05/09/2015 SLP Diet Recommendations Regular solids;Dysphagia 3 (Mech soft) solids;Thin liquid Liquid Administration via Cup;Straw Medication Administration Whole meds with liquid Compensations Multiple dry swallows after each bite/sip Postural Changes Remain semi-upright after after feeds/meals (Comment);Seated upright at 90 degrees  CHL IP OTHER RECOMMENDATIONS 05/09/2015 Recommended Consults -- Oral Care Recommendations Oral care BID Other Recommendations Clarify dietary restrictions   CHL IP FOLLOW UP RECOMMENDATIONS 05/09/2015 Follow up Recommendations None   CHL IP FREQUENCY AND DURATION 05/09/2015 Speech Therapy Frequency (ACUTE ONLY) min 1 x/week Treatment Duration --      CHL IP ORAL PHASE 05/09/2015 Oral Phase WFL Oral - Pudding Teaspoon -- Oral - Pudding Cup -- Oral - Honey Teaspoon -- Oral - Honey Cup -- Oral - Nectar Teaspoon -- Oral - Nectar Cup -- Oral - Nectar Straw -- Oral - Thin Teaspoon -- Oral - Thin Cup -- Oral - Thin Straw -- Oral - Puree -- Oral - Mech Soft -- Oral - Regular -- Oral - Multi-Consistency -- Oral - Pill -- Oral Phase - Comment --  CHL IP PHARYNGEAL PHASE 05/09/2015 Pharyngeal Phase WFL;Impaired Pharyngeal- Pudding  Teaspoon -- Pharyngeal -- Pharyngeal- Pudding Cup -- Pharyngeal -- Pharyngeal- Honey Teaspoon -- Pharyngeal -- Pharyngeal- Honey Cup -- Pharyngeal -- Pharyngeal- Nectar Teaspoon -- Pharyngeal -- Pharyngeal- Nectar Cup -- Pharyngeal -- Pharyngeal- Nectar Straw -- Pharyngeal -- Pharyngeal- Thin Teaspoon -- Pharyngeal -- Pharyngeal- Thin Cup Delayed swallow initiation-vallecula Pharyngeal -- Pharyngeal- Thin Straw Delayed swallow initiation-vallecula;Pharyngeal residue - valleculae Pharyngeal -- Pharyngeal- Puree Delayed swallow initiation-vallecula Pharyngeal -- Pharyngeal- Mechanical Soft -- Pharyngeal -- Pharyngeal- Regular WFL Pharyngeal -- Pharyngeal- Multi-consistency -- Pharyngeal -- Pharyngeal- Pill WFL Pharyngeal -- Pharyngeal Comment --  CHL IP CERVICAL ESOPHAGEAL PHASE 05/09/2015 Cervical Esophageal Phase WFL Pudding Teaspoon -- Pudding Cup -- Honey Teaspoon -- Honey Cup -- Nectar Teaspoon -- Nectar Cup -- Nectar Straw -- Thin Teaspoon -- Thin Cup -- Thin Straw -- Puree -- Mechanical Soft -- Regular -- Multi-consistency -- Pill -- Cervical Esophageal Comment -- Thank you, Genene Churn, CCC-SLP [phone] PORTER,DABNEY 05/09/2015, 5:42 PM               Microbiology: Recent Results (from the past 240 hour(s))  Culture, expectorated sputum-assessment     Status: None   Collection Time: 05/10/15  8:33 PM  Result Value Ref Range Status   Specimen Description SPUTUM  Final   Special Requests Immunocompromised  Final   Sputum evaluation   Final    THIS SPECIMEN IS ACCEPTABLE FOR SPUTUM CULTURE [facility]    Report Status 05/10/2015 FINAL  Final  Culture, respiratory (NON-Expectorated)     Status: None (Preliminary result)   Collection Time: 05/10/15  8:33 PM  Result Value Ref Range Status   Specimen Description SPUTUM EXPECTORATED  Final   Special Requests IMMUNE:COMPROMISED  Final   Gram Stain   Final    ABUNDANT WBC PRESENT, PREDOMINANTLY MONONUCLEAR FEW SQUAMOUS EPITHELIAL CELLS  PRESENT MODERATE GRAM POSITIVE COCCI IN PAIRS MODERATE GRAM NEGATIVE COCCI Performed at [employer]   Final    Culture reincubated for better growth Performed at [facility]    Report Status PENDING  Incomplete     Labs: Basic Metabolic Panel:  Recent Labs Lab 05/08/15 2125 05/10/15 0950 05/11/15 0544  NA 130* 134* 135  K 4.0 3.7 4.1  CL 95* 106 108  CO2 '25 24 24  '$ GLUCOSE 97 90 93  BUN '17 10 8  '$ CREATININE 0.77 0.62 0.62  CALCIUM 9.0 7.9* 7.9*   Liver Function Tests: No results for input(s): AST, ALT, ALKPHOS, BILITOT, PROT, ALBUMIN in the last 168 hours. No results for input(s): LIPASE, AMYLASE in the last 168 hours. No results for input(s): AMMONIA in  the last 168 hours. CBC:  Recent Labs Lab 05/08/15 2125 05/11/15 0544  WBC 6.6 4.7  HGB 8.8* 9.1*  HCT 26.5* 27.9*  MCV 87.2 88.6  PLT 192 197   Cardiac Enzymes:  Recent Labs Lab 05/08/15 2125 05/09/15 0145 05/09/15 0758 05/09/15 1312  TROPONINI <0.03 <0.03 <0.03 <0.03   BNP: BNP (last 3 results) No results for input(s): BNP in the last 8760 hours.  ProBNP (last 3 results) No results for input(s): PROBNP in the last 8760 hours.  CBG: No results for input(s): GLUCAP in the last 168 hours.     SignedLelon Frohlich  [facility] Pager: [phone] 05/12/2015, 2:56 PM

## 2015-05-13 LAB — CULTURE, RESPIRATORY: CULTURE: NORMAL

## 2015-05-13 LAB — CULTURE, RESPIRATORY W GRAM STAIN

## 2015-05-15 ENCOUNTER — Ambulatory Visit (HOSPITAL_BASED_OUTPATIENT_CLINIC_OR_DEPARTMENT_OTHER): Payer: Medicaid Other | Admitting: Oncology

## 2015-05-15 ENCOUNTER — Other Ambulatory Visit (HOSPITAL_BASED_OUTPATIENT_CLINIC_OR_DEPARTMENT_OTHER): Payer: Medicaid Other

## 2015-05-15 ENCOUNTER — Ambulatory Visit (HOSPITAL_BASED_OUTPATIENT_CLINIC_OR_DEPARTMENT_OTHER): Payer: Medicaid Other

## 2015-05-15 ENCOUNTER — Encounter: Payer: Self-pay | Admitting: Oncology

## 2015-05-15 VITALS — BP 109/72 | HR 64 | Temp 97.6°F | Resp 18 | Ht 73.0 in | Wt 152.2 lb

## 2015-05-15 DIAGNOSIS — C7951 Secondary malignant neoplasm of bone: Secondary | ICD-10-CM

## 2015-05-15 DIAGNOSIS — C7970 Secondary malignant neoplasm of unspecified adrenal gland: Secondary | ICD-10-CM

## 2015-05-15 DIAGNOSIS — C7971 Secondary malignant neoplasm of right adrenal gland: Secondary | ICD-10-CM

## 2015-05-15 DIAGNOSIS — C3431 Malignant neoplasm of lower lobe, right bronchus or lung: Secondary | ICD-10-CM

## 2015-05-15 DIAGNOSIS — C7972 Secondary malignant neoplasm of left adrenal gland: Secondary | ICD-10-CM

## 2015-05-15 DIAGNOSIS — R918 Other nonspecific abnormal finding of lung field: Secondary | ICD-10-CM

## 2015-05-15 DIAGNOSIS — Z5112 Encounter for antineoplastic immunotherapy: Secondary | ICD-10-CM | POA: Diagnosis present

## 2015-05-15 DIAGNOSIS — Z79899 Other long term (current) drug therapy: Secondary | ICD-10-CM | POA: Diagnosis not present

## 2015-05-15 LAB — COMPREHENSIVE METABOLIC PANEL WITH GFR
ALT: 58 U/L — ABNORMAL HIGH (ref 0–55)
AST: 26 U/L (ref 5–34)
Albumin: 2.7 g/dL — ABNORMAL LOW (ref 3.5–5.0)
Alkaline Phosphatase: 78 U/L (ref 40–150)
Anion Gap: 10 meq/L (ref 3–11)
BUN: 12.5 mg/dL (ref 7.0–26.0)
CO2: 20 meq/L — ABNORMAL LOW (ref 22–29)
Calcium: 9.3 mg/dL (ref 8.4–10.4)
Chloride: 102 meq/L (ref 98–109)
Creatinine: 0.8 mg/dL (ref 0.7–1.3)
EGFR: >90 ml/min/1.73 m2 (ref >90)
Glucose: 84 mg/dL (ref 70–140)
Potassium: 4 meq/L (ref 3.5–5.1)
Sodium: 132 meq/L — ABNORMAL LOW (ref 136–145)
Total Bilirubin: <0.3 mg/dL (ref 0.20–1.20)
Total Protein: 7.2 g/dL (ref 6.4–8.3)

## 2015-05-15 LAB — CBC WITH DIFFERENTIAL/PLATELET
BASO%: 0.4 % (ref 0.0–2.0)
Basophils Absolute: 0 10e3/uL (ref 0.0–0.1)
EOS%: 4.4 % (ref 0.0–7.0)
Eosinophils Absolute: 0.3 10e3/uL (ref 0.0–0.5)
HCT: 35.1 % — ABNORMAL LOW (ref 38.4–49.9)
HGB: 11.2 g/dL — ABNORMAL LOW (ref 13.0–17.1)
LYMPH%: 30.5 % (ref 14.0–49.0)
MCH: 28.3 pg (ref 27.2–33.4)
MCHC: 31.9 g/dL — ABNORMAL LOW (ref 32.0–36.0)
MCV: 88.6 fL (ref 79.3–98.0)
MONO#: 0.3 10e3/uL (ref 0.1–0.9)
MONO%: 5.6 % (ref 0.0–14.0)
NEUT#: 3.4 10e3/uL (ref 1.5–6.5)
NEUT%: 59.1 % (ref 39.0–75.0)
Platelets: 240 10e3/uL (ref 140–400)
RBC: 3.96 10e6/uL — ABNORMAL LOW (ref 4.20–5.82)
RDW: 13.6 % (ref 11.0–14.6)
WBC: 5.7 10e3/uL (ref 4.0–10.3)
lymph#: 1.7 10e3/uL (ref 0.9–3.3)
nRBC: 0 % (ref 0–0)

## 2015-05-15 LAB — TSH: TSH: 5.103 m(IU)/L — ABNORMAL HIGH (ref 0.320–4.118)

## 2015-05-15 MED ORDER — SODIUM CHLORIDE 0.9 % IV SOLN
Freq: Once | INTRAVENOUS | Status: AC
  Administered 2015-05-15: 16:00:00 via INTRAVENOUS

## 2015-05-15 MED ORDER — SODIUM CHLORIDE 0.9 % IV SOLN
200.0000 mg | Freq: Once | INTRAVENOUS | Status: AC
  Administered 2015-05-15: 200 mg via INTRAVENOUS
  Filled 2015-05-15: qty 8

## 2015-05-15 NOTE — Patient Instructions (Signed)
Cancer Center Discharge Instructions for Patients Receiving Chemotherapy  Today you received the following chemotherapy agents Keytruda  To help prevent nausea and vomiting after your treatment, we encourage you to take your nausea medication    If you develop nausea and vomiting that is not controlled by your nausea medication, call the clinic.   BELOW ARE SYMPTOMS THAT SHOULD BE REPORTED IMMEDIATELY:  *FEVER GREATER THAN 100.5 F  *CHILLS WITH OR WITHOUT FEVER  NAUSEA AND VOMITING THAT IS NOT CONTROLLED WITH YOUR NAUSEA MEDICATION  *UNUSUAL SHORTNESS OF BREATH  *UNUSUAL BRUISING OR BLEEDING  TENDERNESS IN MOUTH AND THROAT WITH OR WITHOUT PRESENCE OF ULCERS  *URINARY PROBLEMS  *BOWEL PROBLEMS  UNUSUAL RASH Items with * indicate a potential emergency and should be followed up as soon as possible.  Feel free to call the clinic you have any questions or concerns. The clinic phone number is [phone].  Please show the CHEMO ALERT CARD at check-in to the Emergency Department and triage nurse.   

## 2015-05-15 NOTE — Progress Notes (Signed)
[place] [phone]   [phone]  OFFICE PROGRESS NOTE  No PCP Per Patient No address on file  DIAGNOSIS: Stage IV (T3, N3,M1b) non-small cell lung cancer, poorly differentiated carcinoma, PDL 1 expression 60% diagnosed in November 2016. It presented with large cavitary right lower lobe lung mass in addition to ipsilateral and contralateral mediastinal lymphadenopathy as well as right axillary, right cervical and upper retroperitoneal nodal metastases in addition to metastatic bone disease and bilateral adrenal metastasis.  PRIOR THERAPY: Palliative radiotherapy to the metastatic bone lesion in the cervical spine.  CURRENT THERAPY: Keytruda (pembrolizumab) 200 mg IV every 3 weeks. First dose 04/24/2015  INTERVAL HISTORY: Barry Cook 59 y.o. male returns to the clinic today for follow-up visit by himself. The patient tolerated the first cycle of Keytruda well with some mild nausea. He went to the hospital due to hiccups and was diagnosed with pneumonia. He was given IV antibiotics during hospitalization and sent home on oral Levaquin. The patient reports he is doing better today. He denies fevers. Has an intermittent cough with yellow sputum production. No hemoptysis. He denies chest pain and shortness of breath. He denied having any significant weight loss or night sweats. The patient has no nausea or vomiting. Denies rash. The patient is here today for evaluation prior to cycle 2 of Keytruda.  MEDICAL HISTORY: Past Medical History  Diagnosis Date  . Cancer ([place])     Lung  . Radiation 03/30/15 - 04/14/15    cervical spine area 30 Gy    ALLERGIES:  has No Known Allergies.  MEDICATIONS:  Current Outpatient Prescriptions  Medication Sig Dispense Refill  . B Complex-Biotin-FA (B-COMPLEX PO) Take 1 tablet by mouth daily.    Marland Kitchen levofloxacin (LEVAQUIN) 750 MG tablet Take 1 tablet (750 mg total) by mouth daily. 7 tablet 0  . megestrol (MEGACE  ES) 625 MG/5ML suspension Take 5 mLs (625 mg total) by mouth daily. 150 mL 0  . Multiple Vitamin (MULTIVITAMIN WITH MINERALS) TABS tablet Take 1 tablet by mouth daily.    Marland Kitchen oxyCODONE (ROXICODONE) 15 MG immediate release tablet Take 1 tablet (15 mg total) by mouth every 4 (four) hours as needed for pain. 45 tablet 0  . polyethylene glycol (MIRALAX / GLYCOLAX) packet Take 17 g by mouth 2 (two) times daily.    . traZODone (DESYREL) 50 MG tablet Take 1 tablet (50 mg total) by mouth at bedtime as needed and may repeat dose one time if needed for sleep. (Patient taking differently: Take 25-50 mg by mouth at bedtime as needed and may repeat dose one time if needed for sleep. ) 60 tablet 0   No current facility-administered medications for this visit.   Facility-Administered Medications Ordered in Other Visits  Medication Dose Route Frequency Provider Last Rate Last Dose  . pembrolizumab (KEYTRUDA) 200 mg in sodium chloride 0.9 % 50 mL chemo infusion  200 mg Intravenous Once Curt Bears, MD 116 mL/hr at 05/15/15 1617 200 mg at 05/15/15 1617    SURGICAL HISTORY: History reviewed. No pertinent past surgical history.  REVIEW OF SYSTEMS:  Constitutional: negative Eyes: negative Ears, nose, mouth, throat, and face: positive for sore throat Respiratory: positive for cough and dyspnea on exertion Cardiovascular: negative Gastrointestinal: negative Genitourinary:negative Integument/breast: negative Hematologic/lymphatic: negative Musculoskeletal:positive for bone pain Neurological: negative Behavioral/Psych: negative Endocrine: negative Allergic/Immunologic: negative   PHYSICAL EXAMINATION: General appearance: alert, cooperative and no distress Head: Normocephalic, without obvious abnormality, atraumatic Neck: no adenopathy,  no JVD, supple, symmetrical, trachea midline and thyroid not enlarged, symmetric, no tenderness/mass/nodules Lymph nodes: Cervical, supraclavicular, and axillary nodes  normal. Resp: clear to auscultation bilaterally Back: symmetric, no curvature. ROM normal. No CVA tenderness. Cardio: regular rate and rhythm, S1, S2 normal, no murmur, click, rub or gallop GI: soft, non-tender; bowel sounds normal; no masses,  no organomegaly Extremities: extremities normal, atraumatic, no cyanosis or edema Neurologic: Alert and oriented X 3, normal strength and tone. Normal symmetric reflexes. Normal coordination and gait  ECOG PERFORMANCE STATUS: 1 - Symptomatic but completely ambulatory  Blood pressure 109/72, pulse 64, temperature 97.6 F (36.4 C), temperature source Oral, resp. rate 18, height '6\' 1"'$  (1.854 m), weight 152 lb 3.2 oz (69.037 kg), SpO2 100 %.  LABORATORY DATA: Lab Results  Component Value Date   WBC 5.7 05/15/2015   HGB 11.2* 05/15/2015   HCT 35.1* 05/15/2015   MCV 88.6 05/15/2015   PLT 240 05/15/2015      Chemistry      Component Value Date/Time   NA 132* 05/15/2015 1411   NA 135 05/11/2015 0544   K 4.0 05/15/2015 1411   K 4.1 05/11/2015 0544   CL 108 05/11/2015 0544   CO2 20* 05/15/2015 1411   CO2 24 05/11/2015 0544   BUN 12.5 05/15/2015 1411   BUN 8 05/11/2015 0544   CREATININE 0.8 05/15/2015 1411   CREATININE 0.62 05/11/2015 0544      Component Value Date/Time   CALCIUM 9.3 05/15/2015 1411   CALCIUM 7.9* 05/11/2015 0544   ALKPHOS 78 05/15/2015 1411   ALKPHOS 66 11/07/2012 0712   AST 26 05/15/2015 1411   AST 20 11/07/2012 0712   ALT 58* 05/15/2015 1411   ALT 14 11/07/2012 0712   BILITOT <0.30 05/15/2015 1411   BILITOT 0.8 11/07/2012 0712       RADIOGRAPHIC STUDIES: Dg Chest 2 View  05/08/2015  CLINICAL DATA:  PICC absent indigestion for 3 days. Chest pain. History of lung cancer. Former smoker. EXAM: CHEST  2 VIEW COMPARISON:  CT chest 03/16/2015.  Chest 03/07/2015 FINDINGS: Cavitary mass again demonstrated in the right cardiophrenic angle posteriorly. There is now fluid within the mass and an air-fluid level is  demonstrated. Possible right paratracheal and right hilar lymphadenopathy. Normal heart size and pulmonary vascularity. Calcified and tortuous aorta. Diffuse interstitial pattern to the lungs is increased since previous study, possibly indicating interstitial edema, interstitial pneumonia, or interstitial tumor spread. Hyperinflation consistent with underlying emphysema. No pneumothorax. Old left rib fracture. IMPRESSION: Cavitary mass in the right lung base posteriorly now contains an air-fluid level. Probable lymphadenopathy in the right paratracheal and right hilar regions. Increasing interstitial pattern to the lungs may indicate developing interstitial edema, interstitial pneumonia, or interstitial tumor spread. Electronically Signed   By: Lucienne Capers M.D.   On: 05/08/2015 23:12   Ct Angio Chest Pe W/cm &/or Wo Cm  05/09/2015  CLINICAL DATA:  Acute onset of generalized chest pain and shortness of breath. Current history of lung cancer. Initial encounter. EXAM: CT ANGIOGRAPHY CHEST WITH CONTRAST TECHNIQUE: Multidetector CT imaging of the chest was performed using the standard protocol during bolus administration of intravenous contrast. Multiplanar CT image reconstructions and MIPs were obtained to evaluate the vascular anatomy. CONTRAST:  152m OMNIPAQUE IOHEXOL 350 MG/ML SOLN COMPARISON:  CT of the chest performed 03/16/2015, and PET/CT performed 03/28/2015 FINDINGS: There is no evidence of pulmonary embolus. The large cavitary mass at the right lower lobe is similar in size to the prior study,  measuring approximately 8.1 x 7.5 cm, but there is now a large amount of fluid within the mass, raising concern for underlying infection. There is increasing airspace opacification at the right lung base, concerning for postobstructive pneumonia. An increasing small right pleural effusion is noted, likely reflecting a malignant effusion. The left lung remains clear.  No pneumothorax is seen. Marked mediastinal  lymphadenopathy is perhaps slightly improved, with a mass measuring 3.7 cm in short axis at the azygoesophageal region, and another mass measuring 3.1 cm in short axis at the right paratracheal region. Right hilar lymphadenopathy is also noted. No significant pericardial effusion is identified. Scattered calcification is noted along the thoracic aorta and proximal great vessels. No axillary lymphadenopathy is seen. The visualized portions of the thyroid gland are unremarkable in appearance. A 2.2 cm metastasis is again noted within the spleen. Visualized portions of the liver are grossly unremarkable. Bilateral large adrenal masses are again noted, measuring approximately 5.2 cm on the right and 3.6 cm on the left. No acute osseous abnormalities are seen. Review of the MIP images confirms the above findings. IMPRESSION: 1. No evidence of pulmonary embolus. 2. Large cavitary mass at the right lower lobe is similar in size, measuring 8.1 x 7.5 cm, but there is now a large amount of fluid within the mass, concerning for underlying infection. Increasing airspace opacification at the right lung base, concerning for postobstructive pneumonia. 3. Increasing small right pleural effusion, likely reflecting a malignant effusion. 4. Marked mediastinal lymphadenopathy is perhaps slightly improved, with masses measuring up to 3.7 cm in short axis. Right hilar lymphadenopathy also seen. 5. Adrenal and splenic metastases again noted. Electronically Signed   By: Garald Balding M.D.   On: 05/09/2015 01:47   Dg Swallowing Func-speech Pathology  05/09/2015  Ephraim Hamburger, CCC-SLP     05/09/2015  5:43 PM Objective Swallowing Evaluation: MBS-Modified Barium Swallow Study Patient Details Name: MARIAH HARN MRN: [id number] Date of Birth: July 03, 1955 Today's Date: 05/09/2015 Time: SLP Start Time (ACUTE ONLY): 1414-SLP Stop Time (ACUTE ONLY): 1430 SLP Time Calculation (min) (ACUTE ONLY): 16 min Past Medical History: Past Medical  History Diagnosis Date . Cancer ([place])    Lung Past Surgical History: History reviewed. No pertinent past surgical history. HPI: LYRIK BURESH is an 59 y.o. 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 He has no fever, chills, or coughs. He denied SOB, but admitted to having trouble with swallowing. Evlaution in the ER included a Hb of 8.8 g per dL, a drop from prior normal Hb. He has no black stool or bloody stool, and has no evidence of bleeding. EKG showed no acute ST T changes, and troponin was normal. He had a CTA which was negative for PE, confirming the present of his large lung mass, and also suggestive of PNA. He was given IV Zosyn in the ER, and hospitalist was asked to admit him for chest pain, anemia, nausea, vomting, and hiccups. He normally sees Dr Curt Bears at [facility] Subjective: "Doing alright" Assessment / Plan / Recommendation CHL IP CLINICAL IMPRESSIONS 05/09/2015 Therapy Diagnosis WFL Clinical Impression MBSS completed this date. Swallow function essentially WFL. No penetration or aspiration observed. Pt with mild vallecular residue, but clears with repeat/dry swallow. No stasis noted in pharynx. Barium tablet became transiently delayed in  distal esophagus near GE junction, but passed after sips of water and bite of applesauce. Pt with some belching after completion of test. SLP reinforced need for pt to continue with oral diet to help combat the effects of radiation therapy long term. Given normal swallow function in today's study, prandial aspiration is doubtful. Pt does endorse heartburn and some heaving due to hiccups so it is possible that pt aspirated post prandial and/or at night. Pt reports elevating HOB at night which he will continue. Recommend  continuing soft diet as ordered as pt reports some difficulty with meats/breads; ok for thin liquids. SLP will follow up X1 for diet tolerance and ongoing education as needed. Pt in agreement with plan of care.  Impact on safety and function No limitations   CHL IP TREATMENT RECOMMENDATION 05/09/2015 Treatment Recommendations Therapy as outlined in treatment plan below   Prognosis 05/09/2015 Prognosis for Safe Diet Advancement Good Barriers to Reach Goals -- Barriers/Prognosis Comment -- CHL IP DIET RECOMMENDATION 05/09/2015 SLP Diet Recommendations Regular solids;Dysphagia 3 (Mech soft) solids;Thin liquid Liquid Administration via Cup;Straw Medication Administration Whole meds with liquid Compensations Multiple dry swallows after each bite/sip Postural Changes Remain semi-upright after after feeds/meals (Comment);Seated upright at 90 degrees   CHL IP OTHER RECOMMENDATIONS 05/09/2015 Recommended Consults -- Oral Care Recommendations Oral care BID Other Recommendations Clarify dietary restrictions   CHL IP FOLLOW UP RECOMMENDATIONS 05/09/2015 Follow up Recommendations None   CHL IP FREQUENCY AND DURATION 05/09/2015 Speech Therapy Frequency (ACUTE ONLY) min 1 x/week Treatment Duration --      CHL IP ORAL PHASE 05/09/2015 Oral Phase WFL Oral - Pudding Teaspoon -- Oral - Pudding Cup -- Oral - Honey Teaspoon -- Oral - Honey Cup -- Oral - Nectar Teaspoon -- Oral - Nectar Cup -- Oral - Nectar Straw -- Oral - Thin Teaspoon -- Oral - Thin Cup -- Oral - Thin Straw -- Oral - Puree -- Oral - Mech Soft -- Oral - Regular -- Oral - Multi-Consistency -- Oral - Pill -- Oral Phase - Comment --  CHL IP PHARYNGEAL PHASE 05/09/2015 Pharyngeal Phase WFL;Impaired Pharyngeal- Pudding Teaspoon -- Pharyngeal -- Pharyngeal- Pudding Cup -- Pharyngeal -- Pharyngeal- Honey Teaspoon -- Pharyngeal -- Pharyngeal- Honey Cup -- Pharyngeal -- Pharyngeal- Nectar Teaspoon -- Pharyngeal -- Pharyngeal- Nectar Cup -- Pharyngeal -- Pharyngeal- Nectar Straw  -- Pharyngeal -- Pharyngeal- Thin Teaspoon -- Pharyngeal -- Pharyngeal- Thin Cup Delayed swallow initiation-vallecula Pharyngeal -- Pharyngeal- Thin Straw Delayed swallow initiation-vallecula;Pharyngeal residue - valleculae Pharyngeal -- Pharyngeal- Puree Delayed swallow initiation-vallecula Pharyngeal -- Pharyngeal- Mechanical Soft -- Pharyngeal -- Pharyngeal- Regular WFL Pharyngeal -- Pharyngeal- Multi-consistency -- Pharyngeal -- Pharyngeal- Pill WFL Pharyngeal -- Pharyngeal Comment --  CHL IP CERVICAL ESOPHAGEAL PHASE 05/09/2015 Cervical Esophageal Phase WFL Pudding Teaspoon -- Pudding Cup -- Honey Teaspoon -- Honey Cup -- Nectar Teaspoon -- Nectar Cup -- Nectar Straw -- Thin Teaspoon -- Thin Cup -- Thin Straw -- Puree -- Mechanical Soft -- Regular -- Multi-consistency -- Pill -- Cervical Esophageal Comment -- Thank you, Genene Churn, [place] PORTER,DABNEY 05/09/2015, 5:42 PM               ASSESSMENT AND PLAN: This is a very pleasant 59 year old white male recently diagnosed with a stage IV (T3, N3, M1 B) poorly differentiated carcinoma with positive PDL 1 expression (60%) diagnosed in November 2016 status post palliative radiotherapy to metastatic cervical bone lesion. The patient is currently undergoing immunotherapy with Beryle Flock which was recently approved in first-line therapy for  patient with PDL 1 expression over 50%.  The patient was seen and discussed with Dr. Julien Nordmann. Patient tolerated his first cycle Keytruda well. Recommend he proceed with cycle 2 of his immunotherapy as scheduled today.  He would come back for follow-up visit in 3 weeks with the start of cycle #3.  The patient was advised to call immediately if he has any concerning symptoms in the interval. The patient voices understanding of current disease status and treatment options and is in agreement with the current care plan.  All questions were answered. The patient knows to call the clinic with any problems,  questions or concerns. We can certainly see the patient much sooner if necessary.  Mikey Bussing, DNP, AGPCNP-BC, AOCNP  ADDENDUM: Hematology/Oncology Attending: I had a face to face encounter with the patient today. I recommended his care plan. This is a very pleasant 59 years old white male recently diagnosed with stage IV non-small cell lung cancer with positive PDL 1 expression (60%). The patient is currently undergoing treatment with immunotherapy with Ketruda status post 1 cycle. He tolerated the first cycle of his treatment fairly well with no significant adverse effects. He was recently treated at [facility] for questionable pneumonia and his symptoms had completely resolved at this point. I recommended for the patient to proceed with cycle #2 today as scheduled. He would come back for follow-up visit in 3 weeks for reevaluation with the start of cycle #3. The patient was advised to call immediately if he has any concerning symptoms in the interval.  Disclaimer: This note was dictated with voice recognition software. Similar sounding words can inadvertently be transcribed and may be missed upon review. [name]., MD 05/15/2015

## 2015-05-15 NOTE — Progress Notes (Signed)
Barry Cook stopped by the clinic and requested a refill on oxycodone 15 mg.  Advised her that Dr. Sondra Come is out of the clinic until tomorrow and that we will let her know when it is available for pick up.

## 2015-05-16 ENCOUNTER — Telehealth: Payer: Self-pay | Admitting: Oncology

## 2015-05-16 NOTE — Telephone Encounter (Signed)
Barry Cook that the prescription for pain medication should be available tomorrow morning and that we will call when we have it available.  [name] verbalized agreement.

## 2015-05-17 ENCOUNTER — Other Ambulatory Visit: Payer: Self-pay | Admitting: Radiation Oncology

## 2015-05-17 ENCOUNTER — Telehealth: Payer: Self-pay | Admitting: Oncology

## 2015-05-17 ENCOUNTER — Encounter: Payer: Self-pay | Admitting: Oncology

## 2015-05-17 ENCOUNTER — Other Ambulatory Visit: Payer: Self-pay | Admitting: Medical Oncology

## 2015-05-17 MED ORDER — OXYCODONE HCL 15 MG PO TABS: 15.0000 mg | ORAL_TABLET | ORAL | Status: DC | PRN

## 2015-05-17 NOTE — Telephone Encounter (Signed)
Called Barry Cook and advised her that the pain prescription is ready for pick up in the radiation nursing area.  Barry Cook verbalized agreement.

## 2015-05-17 NOTE — Telephone Encounter (Signed)
Pt said he was surprised that he fell, "My legs just gave way". He denies any balance issues. He told me he is following up with Dr Sondra Come tomorrow.

## 2015-05-17 NOTE — Progress Notes (Signed)
Patient's wife stopped by to pick up pain prescription.  She mentioned that Gwyndolyn Saxon fell at home this morning.  She said his legs gave out and she and her son where able to get him back up.  She said [place] had an immunotherapy infusion two days ago and she wanted to make his doctor's aware of the fall.  Advised her to call Dr. Worthy Flank nurse and also that he may need to go to the ER to be evaluated.  [name] said he will most likely refuse to go.

## 2015-05-18 ENCOUNTER — Encounter: Payer: Self-pay | Admitting: Radiation Oncology

## 2015-05-18 ENCOUNTER — Ambulatory Visit
Admission: RE | Admit: 2015-05-18 | Discharge: 2015-05-18 | Disposition: A | Discharge status: Home / Self Care | Payer: Self-pay | Source: Ambulatory Visit | Attending: Radiation Oncology | Admitting: Radiation Oncology

## 2015-05-18 VITALS — BP 98/70 | HR 102 | Temp 98.0°F | Ht 73.0 in | Wt 147.9 lb

## 2015-05-18 DIAGNOSIS — C3430 Malignant neoplasm of lower lobe, unspecified bronchus or lung: Secondary | ICD-10-CM

## 2015-05-18 NOTE — Progress Notes (Signed)
Radiation Oncology         [phone] ________________________________  Name: Barry Cook MRN: [id number]  Date: 05/18/2015  DOB: September 17, 1955  Follow-Up Visit Note  CC: No PCP Per Patient  Curt Bears, MD   Diagnosis: Stage IV Lung Cancer with Cervical Spine Metastasis  Interval Since Last Radiation: 4 weeks. 03/30/2015-04/14/2015 Site/dose:  Cervical spine area, 30 Gy in 10 fractions  Narrative:  The patient returns today for routine follow-up. He reports pain as a 6/10 in his neck, chest, ribs, and left hand today. He reports he is taking 4 tablets of oxycodone 15 mg per day. He feels weak and sometimes has a hard time getting his legs to move. He fell yesterday and has scrapes on his face and left hand knuckles and fingers. His knuckles are swollen. He states that he fell because, "my legs gave away." He did not lose consciousness. He reports his neck pain is better and is not as constant. He had chemotherapy on Monday. He was hosptialized with pneumonia last week and was discharged on Friday. He reports his appetite is better although he has lost 5 lbs. He also states that food does not taste good.  ALLERGIES:  has No Known Allergies.  Meds: Current Outpatient Prescriptions  Medication Sig Dispense Refill  . B Complex-Biotin-FA (B-COMPLEX PO) Take 1 tablet by mouth daily.    Marland Kitchen levofloxacin (LEVAQUIN) 750 MG tablet Take 1 tablet (750 mg total) by mouth daily. 7 tablet 0  . megestrol (MEGACE ES) 625 MG/5ML suspension Take 5 mLs (625 mg total) by mouth daily. 150 mL 0  . Multiple Vitamin (MULTIVITAMIN WITH MINERALS) TABS tablet Take 1 tablet by mouth daily.    Marland Kitchen oxyCODONE (ROXICODONE) 15 MG immediate release tablet Take 1 tablet (15 mg total) by mouth every 4 (four) hours as needed for pain. 45 tablet 0  . polyethylene glycol (MIRALAX / GLYCOLAX) packet Take 17 g by mouth 2 (two) times daily.    . traZODone (DESYREL) 50 MG tablet Take 1 tablet (50 mg total) by mouth at  bedtime as needed and may repeat dose one time if needed for sleep. (Patient taking differently: Take 25-50 mg by mouth at bedtime as needed and may repeat dose one time if needed for sleep. ) 60 tablet 0   No current facility-administered medications for this encounter.    Physical Findings: The patient is in no acute distress. Patient is alert and oriented.  height is '6\' 1"'$  (1.854 m) and weight is 147 lb 14.4 oz (67.087 kg). His oral temperature is 98 F (36.7 C). His blood pressure is 117/71 and his pulse is 71. His oxygen saturation is 100%.   Oropharynx is clear with no infection. Lungs are clear to auscultation bilaterally. Heart has regular rate and rhythm. His motor strenth is symmetric throughout.  Lab Findings: Lab Results  Component Value Date   WBC 5.7 05/15/2015   HGB 11.2* 05/15/2015   HCT 35.1* 05/15/2015   MCV 88.6 05/15/2015   PLT 240 05/15/2015    Radiographic Findings: Dg Chest 2 View  05/08/2015  CLINICAL DATA:  PICC absent indigestion for 3 days. Chest pain. History of lung cancer. Former smoker. EXAM: CHEST  2 VIEW COMPARISON:  CT chest 03/16/2015.  Chest 03/07/2015 FINDINGS: Cavitary mass again demonstrated in the right cardiophrenic angle posteriorly. There is now fluid within the mass and an air-fluid level is demonstrated. Possible right paratracheal and right hilar lymphadenopathy. Normal heart size and pulmonary vascularity. Calcified  and tortuous aorta. Diffuse interstitial pattern to the lungs is increased since previous study, possibly indicating interstitial edema, interstitial pneumonia, or interstitial tumor spread. Hyperinflation consistent with underlying emphysema. No pneumothorax. Old left rib fracture. IMPRESSION: Cavitary mass in the right lung base posteriorly now contains an air-fluid level. Probable lymphadenopathy in the right paratracheal and right hilar regions. Increasing interstitial pattern to the lungs may indicate developing interstitial  edema, interstitial pneumonia, or interstitial tumor spread. Electronically Signed   By: Lucienne Capers M.D.   On: 05/08/2015 23:12   Ct Angio Chest Pe W/cm &/or Wo Cm  05/09/2015  CLINICAL DATA:  Acute onset of generalized chest pain and shortness of breath. Current history of lung cancer. Initial encounter. EXAM: CT ANGIOGRAPHY CHEST WITH CONTRAST TECHNIQUE: Multidetector CT imaging of the chest was performed using the standard protocol during bolus administration of intravenous contrast. Multiplanar CT image reconstructions and MIPs were obtained to evaluate the vascular anatomy. CONTRAST:  127m OMNIPAQUE IOHEXOL 350 MG/ML SOLN COMPARISON:  CT of the chest performed 03/16/2015, and PET/CT performed 03/28/2015 FINDINGS: There is no evidence of pulmonary embolus. The large cavitary mass at the right lower lobe is similar in size to the prior study, measuring approximately 8.1 x 7.5 cm, but there is now a large amount of fluid within the mass, raising concern for underlying infection. There is increasing airspace opacification at the right lung base, concerning for postobstructive pneumonia. An increasing small right pleural effusion is noted, likely reflecting a malignant effusion. The left lung remains clear.  No pneumothorax is seen. Marked mediastinal lymphadenopathy is perhaps slightly improved, with a mass measuring 3.7 cm in short axis at the azygoesophageal region, and another mass measuring 3.1 cm in short axis at the right paratracheal region. Right hilar lymphadenopathy is also noted. No significant pericardial effusion is identified. Scattered calcification is noted along the thoracic aorta and proximal great vessels. No axillary lymphadenopathy is seen. The visualized portions of the thyroid gland are unremarkable in appearance. A 2.2 cm metastasis is again noted within the spleen. Visualized portions of the liver are grossly unremarkable. Bilateral large adrenal masses are again noted, measuring  approximately 5.2 cm on the right and 3.6 cm on the left. No acute osseous abnormalities are seen. Review of the MIP images confirms the above findings. IMPRESSION: 1. No evidence of pulmonary embolus. 2. Large cavitary mass at the right lower lobe is similar in size, measuring 8.1 x 7.5 cm, but there is now a large amount of fluid within the mass, concerning for underlying infection. Increasing airspace opacification at the right lung base, concerning for postobstructive pneumonia. 3. Increasing small right pleural effusion, likely reflecting a malignant effusion. 4. Marked mediastinal lymphadenopathy is perhaps slightly improved, with masses measuring up to 3.7 cm in short axis. Right hilar lymphadenopathy also seen. 5. Adrenal and splenic metastases again noted. Electronically Signed   By: JGarald BaldingM.D.   On: 05/09/2015 01:47   Dg Swallowing Func-speech Pathology  05/09/2015  DEphraim Hamburger CCC-SLP     05/09/2015  5:43 PM Objective Swallowing Evaluation: MBS-Modified Barium Swallow Study Patient Details Name: Barry SWANDERMRN: [id number] of Birth: [birth year]Today's Date: 05/09/2015 Time: SLP Start Time (ACUTE ONLY): 1414-SLP Stop Time (ACUTE ONLY): 1430 SLP Time Calculation (min) (ACUTE ONLY): 16 min Past Medical History: Past Medical History Diagnosis Date . [place]    [place] Past Surgical History: History reviewed. No pertinent past surgical history. HPI: WHATEM CULLis an 59y.o. male  lung CA (Stage IV, T3,N3, M1b, non small cell lung CA) with spine metastatasis, undergoing palliative radiotherapy, presented to the ER with several complaints including hiccups for 3 days, nausea and vomiting with decrease in appetite, and right sided pleuritic CP. He has no fever, chills, or coughs. He denied SOB, but admitted to having trouble with swallowing. Evlaution in the ER included a Hb of 8.8 g per dL, a drop from prior normal Hb. He has no black stool or bloody stool, and has no  evidence of bleeding. EKG showed no acute ST T changes, and troponin was normal. He had a CTA which was negative for PE, confirming the present of his large lung mass, and also suggestive of PNA. He was given IV Zosyn in the ER, and hospitalist was asked to admit him for chest pain, anemia, nausea, vomting, and hiccups. He normally sees Dr Curt Bears at [facility] Subjective: "Doing alright" Assessment / Plan / Recommendation CHL IP CLINICAL IMPRESSIONS 05/09/2015 Therapy Diagnosis WFL Clinical Impression MBSS completed this date. Swallow function essentially WFL. No penetration or aspiration observed. Pt with mild vallecular residue, but clears with repeat/dry swallow. No stasis noted in pharynx. Barium tablet became transiently delayed in distal esophagus near GE junction, but passed after sips of water and bite of applesauce. Pt with some belching after completion of test. SLP reinforced need for pt to continue with oral diet to help combat the effects of radiation therapy long term. Given normal swallow function in today's study, prandial aspiration is doubtful. Pt does endorse heartburn and some heaving due to hiccups so it is possible that pt aspirated post prandial and/or at night. Pt reports elevating HOB at night which he will continue. Recommend continuing soft diet as ordered as pt reports some difficulty with meats/breads; ok for thin liquids. SLP will follow up X1 for diet tolerance and ongoing education as needed. Pt in agreement with plan of care.  Impact on safety and function No limitations   CHL IP TREATMENT RECOMMENDATION 05/09/2015 Treatment Recommendations Therapy as outlined in treatment plan below   Prognosis 05/09/2015 Prognosis for Safe Diet Advancement Good Barriers to Reach Goals -- Barriers/Prognosis Comment -- CHL IP DIET RECOMMENDATION 05/09/2015 SLP Diet Recommendations Regular solids;Dysphagia 3 (Mech soft) solids;Thin liquid Liquid Administration via Cup;Straw Medication  Administration Whole meds with liquid Compensations Multiple dry swallows after each bite/sip Postural Changes Remain semi-upright after after feeds/meals (Comment);Seated upright at 90 degrees   CHL IP OTHER RECOMMENDATIONS 05/09/2015 Recommended Consults -- Oral Care Recommendations Oral care BID Other Recommendations Clarify dietary restrictions   CHL IP FOLLOW UP RECOMMENDATIONS 05/09/2015 Follow up Recommendations None   CHL IP FREQUENCY AND DURATION 05/09/2015 Speech Therapy Frequency (ACUTE ONLY) min 1 x/week Treatment Duration --      CHL IP ORAL PHASE 05/09/2015 Oral Phase WFL Oral - Pudding Teaspoon -- Oral - Pudding Cup -- Oral - Honey Teaspoon -- Oral - Honey Cup -- Oral - Nectar Teaspoon -- Oral - Nectar Cup -- Oral - Nectar Straw -- Oral - Thin Teaspoon -- Oral - Thin Cup -- Oral - Thin Straw -- Oral - Puree -- Oral - Mech Soft -- Oral - Regular -- Oral - Multi-Consistency -- Oral - Pill -- Oral Phase - Comment --  CHL IP PHARYNGEAL PHASE 05/09/2015 Pharyngeal Phase WFL;Impaired Pharyngeal- Pudding Teaspoon -- Pharyngeal -- Pharyngeal- Pudding Cup -- Pharyngeal -- Pharyngeal- Honey Teaspoon -- Pharyngeal -- Pharyngeal- Honey Cup -- Pharyngeal -- Pharyngeal- Nectar Teaspoon -- Pharyngeal -- Pharyngeal- Nectar Cup --  Pharyngeal -- Pharyngeal- Nectar Straw -- Pharyngeal -- Pharyngeal- Thin Teaspoon -- Pharyngeal -- Pharyngeal- Thin Cup Delayed swallow initiation-vallecula Pharyngeal -- Pharyngeal- Thin Straw Delayed swallow initiation-vallecula;Pharyngeal residue - valleculae Pharyngeal -- Pharyngeal- Puree Delayed swallow initiation-vallecula Pharyngeal -- Pharyngeal- Mechanical Soft -- Pharyngeal -- Pharyngeal- Regular WFL Pharyngeal -- Pharyngeal- Multi-consistency -- Pharyngeal -- Pharyngeal- Pill WFL Pharyngeal -- Pharyngeal Comment --  CHL IP CERVICAL ESOPHAGEAL PHASE 05/09/2015 Cervical Esophageal Phase WFL Pudding Teaspoon -- Pudding Cup -- Honey Teaspoon -- Honey Cup -- Nectar Teaspoon -- Nectar  Cup -- Nectar Straw -- Thin Teaspoon -- Thin Cup -- Thin Straw -- Puree -- Mechanical Soft -- Regular -- Multi-consistency -- Pill -- Cervical Esophageal Comment -- Thank you, Genene Churn, Hood PORTER,DABNEY 05/09/2015, 5:42 PM              Impression:  The patient is recovering from the effects of radiation. Neck pain better after his palliative radiation therapy to the cervical spine.  Plan: The patient will follow up with med/onc and will see me on a PRN basis. He knows to call me if he has any questions or concerns.  ____________________________________  Blair Promise, PhD, MD  This document serves as a record of services personally performed by Gery Pray, MD. It was created on his behalf by Darcus Austin, a trained medical scribe. The creation of this record is based on the scribe's personal observations and the provider's statements to them. This document has been checked and approved by the attending provider.

## 2015-05-18 NOTE — Progress Notes (Signed)
Eloise Levels here for follow up.  He reports pain at a 6/10 in his neck, chest, ribs and left hand today.  He reports he is taking 4 tablets of oxycodone 15 mg per day. He feels weak and sometimes has a hard time getting his legs to move.  He fell yesterday and has scrapes on his face and left hand knuckles and fingers.  His knuckles are swollen.  He reports his neck pain is better and not as constant.  He had chemotherapy on Monday.  He was hospitalized with pneumonia and hiccups last week and was discharged on Friday.  He reports his appetite is better although he has lost 5 lbs since 12/19.  Orthostatic vitals taken: bp sitting 117/71, hr 71, bp standing 98/70, hr 102. He denies trouble swallowing. He says his throat is occasionally "scratchy."  He is using supplemental oxygen at night as needed.  BP 117/71 mmHg  Pulse 71  Temp(Src) 98 F (36.7 C) (Oral)  Ht '6\' 1"'$  (1.854 m)  Wt 147 lb 14.4 oz (67.087 kg)  BMI 19.52 kg/m2  SpO2 100%   Wt Readings from Last 3 Encounters:  05/18/15 147 lb 14.4 oz (67.087 kg)  05/15/15 152 lb 3.2 oz (69.037 kg)  05/08/15 152 lb (68.947 kg)

## 2015-05-19 ENCOUNTER — Telehealth: Payer: Self-pay | Admitting: *Deleted

## 2015-05-19 NOTE — Telephone Encounter (Signed)
Call received in [place] from pt's wife stating she wanted to ask Dr Julien Nordmann about Barry Cook and his home O2.  Per wife she stated - pt is receiving O2 via [facility] which is costing them $150 a month. " we do not have insurance coverage "  Family member has on O2 machine " just like his and has offered it for [place] to use free ".  " is it ok with Dr Julien Nordmann ".  This RN informed above difficult to answer due to this office does not distribute oxygen machines.  Wife states the machines " are exactly alike and works the same ".  She has contacted [place] to discuss " which they say is ok but that they cannot be liable which I do not care - I will be liable "  " it is just in 2 weeks [facility] will wants another $150 dollars which we just do not have ".  This RN informed pt above would be given to MD and RN at desk for any concerns or other recommendations.  No other current needs at this time.  Presently pt is using O2 supplied by [facility].

## 2015-05-20 NOTE — Telephone Encounter (Signed)
Unfortunately, I can't approve this but she can do it on her own.

## 2015-05-23 ENCOUNTER — Encounter (HOSPITAL_COMMUNITY): Payer: Self-pay | Admitting: *Deleted

## 2015-05-23 ENCOUNTER — Emergency Department (HOSPITAL_COMMUNITY)
Admission: EM | Admit: 2015-05-23 | Discharge: 2015-05-23 | Disposition: A | Discharge status: Home / Self Care | Payer: Medicaid Other | Attending: Emergency Medicine | Admitting: Emergency Medicine

## 2015-05-23 ENCOUNTER — Emergency Department (HOSPITAL_COMMUNITY): Payer: Medicaid Other

## 2015-05-23 DIAGNOSIS — Z87891 Personal history of nicotine dependence: Secondary | ICD-10-CM | POA: Insufficient documentation

## 2015-05-23 DIAGNOSIS — Z87438 Personal history of other diseases of male genital organs: Secondary | ICD-10-CM | POA: Diagnosis not present

## 2015-05-23 DIAGNOSIS — R339 Retention of urine, unspecified: Secondary | ICD-10-CM

## 2015-05-23 DIAGNOSIS — C799 Secondary malignant neoplasm of unspecified site: Secondary | ICD-10-CM

## 2015-05-23 DIAGNOSIS — C7800 Secondary malignant neoplasm of unspecified lung: Secondary | ICD-10-CM | POA: Insufficient documentation

## 2015-05-23 DIAGNOSIS — J449 Chronic obstructive pulmonary disease, unspecified: Secondary | ICD-10-CM | POA: Diagnosis not present

## 2015-05-23 DIAGNOSIS — Z79899 Other long term (current) drug therapy: Secondary | ICD-10-CM | POA: Insufficient documentation

## 2015-05-23 HISTORY — DX: Secondary malignant neoplasm of unspecified site: C79.9

## 2015-05-23 HISTORY — DX: Chronic obstructive pulmonary disease, unspecified: J44.9

## 2015-05-23 LAB — CBC WITH DIFFERENTIAL/PLATELET
BASOS ABS: 0 10*3/uL (ref 0.0–0.1)
BASOS PCT: 0 %
EOS PCT: 7 %
Eosinophils Absolute: 0.3 10*3/uL (ref 0.0–0.7)
HCT: 31.2 % — ABNORMAL LOW (ref 39.0–52.0)
Hemoglobin: 10.5 g/dL — ABNORMAL LOW (ref 13.0–17.0)
LYMPHS PCT: 41 %
Lymphs Abs: 2 10*3/uL (ref 0.7–4.0)
MCH: 29.2 pg (ref 26.0–34.0)
MCHC: 33.7 g/dL (ref 30.0–36.0)
MCV: 86.9 fL (ref 78.0–100.0)
Monocytes Absolute: 0.4 10*3/uL (ref 0.1–1.0)
Monocytes Relative: 7 %
Neutro Abs: 2.2 10*3/uL (ref 1.7–7.7)
Neutrophils Relative %: 45 %
PLATELETS: 225 10*3/uL (ref 150–400)
RBC: 3.59 MIL/uL — AB (ref 4.22–5.81)
RDW: 14 % (ref 11.5–15.5)
WBC: 4.8 10*3/uL (ref 4.0–10.5)

## 2015-05-23 LAB — BASIC METABOLIC PANEL
ANION GAP: 8 (ref 5–15)
BUN: 20 mg/dL (ref 6–20)
CALCIUM: 9 mg/dL (ref 8.9–10.3)
CO2: 26 mmol/L (ref 22–32)
Chloride: 100 mmol/L — ABNORMAL LOW (ref 101–111)
Creatinine, Ser: 0.78 mg/dL (ref 0.61–1.24)
GFR calc Af Amer: >60 mL/min (ref >60)
Glucose, Bld: 82 mg/dL (ref 65–99)
POTASSIUM: 4.2 mmol/L (ref 3.5–5.1)
SODIUM: 134 mmol/L — AB (ref 135–145)

## 2015-05-23 LAB — URINALYSIS, ROUTINE W REFLEX MICROSCOPIC
Bilirubin Urine: NEGATIVE
Glucose, UA: NEGATIVE mg/dL
Hgb urine dipstick: NEGATIVE
Ketones, ur: NEGATIVE mg/dL
LEUKOCYTES UA: NEGATIVE
NITRITE: NEGATIVE
PROTEIN: NEGATIVE mg/dL
Specific Gravity, Urine: 1.015 (ref 1.005–1.030)
pH: 7 (ref 5.0–8.0)

## 2015-05-23 MED ORDER — FENTANYL CITRATE (PF) 100 MCG/2ML IJ SOLN
50.0000 ug | INTRAMUSCULAR | Status: AC | PRN
  Administered 2015-05-23 (×2): 50 ug via INTRAVENOUS
  Filled 2015-05-23 (×2): qty 2

## 2015-05-23 MED ORDER — TAMSULOSIN HCL 0.4 MG PO CAPS
0.4000 mg | ORAL_CAPSULE | Freq: Every day | ORAL | Status: DC
  Administered 2015-05-23: 0.4 mg via ORAL
  Filled 2015-05-23: qty 1

## 2015-05-23 MED ORDER — IOHEXOL 300 MG/ML  SOLN
100.0000 mL | Freq: Once | INTRAMUSCULAR | Status: AC | PRN
  Administered 2015-05-23: 100 mL via INTRAVENOUS

## 2015-05-23 NOTE — Consult Note (Signed)
Urology Consult  Consulting SN:[id number] CC: Urinary retention  HPI: This is a 59 year old male with metastatic lung cancer, presenting to the emergency room here at [facility] with a several day history of significant problems urinating. He has very low urinary output, suprapubic discomfort, slow stream and feeling of incomplete emptying. He denies long-standing history of difficulty urinating. There is no prior history of prostatic infections or him having been told that he had a large prostate. There is no family history of prostate cancer. There is no history of gross hematuria. He does not seek regular medical attention, at least prior to his diagnosis in October 2016 with metastatic lung cancer.  Prior to this urologic consultation, 3 attempts were made to catheterize the patient's bladder, unsuccessfully.  PMH: Past Medical History  Diagnosis Date  . Radiation 03/30/15 - 04/14/15    cervical spine area 30 Gy  . Cancer ([place])     Lung  . COPD (chronic obstructive pulmonary disease) ([place])   . Metastatic cancer ([place])     PSH: History reviewed. No pertinent past surgical history.  Allergies: No Known Allergies  Medications:  (Not in a hospital admission)   Social History: Social History   Social History  . Marital Status: Married    Spouse Name: N/A  . Number of Children: N/A  . Years of Education: N/A   Occupational History  . Not on file.   Social History Main Topics  . Smoking status: Former Smoker -- 1.00 packs/day    Types: Cigarettes  . Smokeless tobacco: Not on file  . Alcohol Use: 7.2 oz/week    12 Cans of beer per week     Comment: occ  . Drug Use: Yes    Special: Cocaine     Comment: denies any 05/08/2015  . Sexual Activity: Yes   Other Topics Concern  . Not on file   Social History Narrative    Family History: History reviewed. No pertinent family history.  Review of Systems: Positive: Slow stream, feeling of incomplete emptying, suprapubic  discomfort, frequency, urgency, intermittency. Also multiple symptoms related to his metastatic lung cancer Negative: .  A further 10 point review of systems was negative except what is listed in the HPI.  Physical Exam: '@VITALS2'$ @ General: No acute distress.  Awake. Cachectic Head:  Normocephalic.  Atraumatic. ENT:  EOMI.  Mucous membranes moist Neck:  Supple.  No lymphadenopathy. CV:  S1 present. S2 present. Regular rate. Pulmonary: Equal effort bilaterally.  Clear to auscultation bilaterally. Abdomen: Soft.  Flat, nondistended, suprapubic tenderness with bladder palpable to approximately 2 fingerbreadths below the umbilicus. Skin:  Normal turgor.  No visible rash. Extremity: No gross deformity of bilateral upper extremities.  No gross deformity of    bilateral lower extremities. Neurologic: Alert. Appropriate mood.  Penis:  Circumcised, mild desquamated skin on his glands. No specific lesions noted Urethra:   Orthotopic meatus. Scrotum: No lesions.  No ecchymosis.  No erythema. Testicles: Descended bilaterally.  No masses bilaterally. Epididymis: Palpable bilaterally.  Non Tender to palpation. Rectal:          Normal anal sphincter tone. Skin tags noted. No rectal masses. Prostate was 30 g, symmetric, nonnodular, nontender. No significant stool within the rectal vault.     Studies:  Recent Labs     05/23/15  1533  HGB  10.5*  WBC  4.8  PLT  225    Recent Labs     05/23/15  1533  NA  134*  K  4.2  CL  100*  CO2  26  BUN  20  CREATININE  0.78  CALCIUM  9.0  GFRNONAA  >60  GFRAA  >60     No results for input(s): INR, APTT in the last 72 hours.  Invalid input(s): PT   Invalid input(s): ABG    After sterile prep and drape, approximate 20 mL of K-Y jelly was placed in the urethra. A.m. 18 French coud-tip catheter was negotiated and the bladder without significant difficulty. 1200 mL of concentrated urine was obtained. The patient tolerated the procedure well. A  bedside bag was placed.   Assessment:  Bladder outlet obstruction in a 59 year old male with metastatic lung cancer. Prostatic exam is normal/not indicative of the cancer. Etiology unknown, but catheterization with coud-tip catheter was not that difficult.   plan: 1. I would leave the catheter in for the better part of a week  2. I will have him follow-up with [place] across the street in the urology office in about a week  3. I sent a prescription for Flomax that he can start    Pager:[phone]

## 2015-05-23 NOTE — Telephone Encounter (Addendum)
Called pt wife Linda-LMOVM advised MD is unable to approve, however she can do it on her own.

## 2015-05-23 NOTE — ED Notes (Addendum)
Pt reports not being able to void without straining. Denies any other problems. Hx of lung CA with radiation.

## 2015-05-23 NOTE — Discharge Instructions (Signed)
1. Leave the catheter bag on until next week, when you see Barry Cook in the office  2. Take the tamsulosin which has been sent to [facility], once daily and to your told to stop

## 2015-05-23 NOTE — ED Notes (Addendum)
Pt has been having difficulty with urination over the last 5-6 days. Pt states, "I have to strain to pee and when I do it's only a small amount". Pt tender over suprapubic area. No flank pain. Denies blood in the his urine, painful urination, fever. No bowel changes. Pt recently diagnosed with lung cancer on 03/07/15 and is concerned that his cancer may have spread to his bladder due to his difficulty voiding.

## 2015-05-23 NOTE — ED Notes (Signed)
Coude catheter attempted per order of Dr. Thurnell Garbe. Unsuccessful attempt. Dr. Thurnell Garbe notified.

## 2015-05-23 NOTE — ED Notes (Signed)
Unable to insert foley catheter due to resistance met upon insertion. RN aware  

## 2015-05-23 NOTE — ED Provider Notes (Signed)
CSN: [id number]     Arrival date & time 05/23/15  1141 History   First MD Initiated Contact with Patient 05/23/15 1407     Chief Complaint  Patient presents with  . Urinary Retention      HPI  Pt was seen at 1420. Per pt and his family, c/o gradual onset and worsening of persistent "urinary retention" for the past 5 days. Pt states he has been "dribbling urine" and "having to push it out." Has been associated with suprapubic abd "pain." Pt has significant hx of metastatic lung CA. Denies any other symptoms. Denies dysuria/hematuria, no testicular pain/swelling, no backflank pain, no fevers.    Past Medical History  Diagnosis Date  . Radiation 03/30/15 - 04/14/15    cervical spine area 30 Gy  . Cancer ([place])     Lung  . COPD (chronic obstructive pulmonary disease) ([place])   . Metastatic cancer ([place])    History reviewed. No pertinent past surgical history.  Social History  Substance Use Topics  . Smoking status: Former Smoker -- 1.00 packs/day    Types: Cigarettes  . Smokeless tobacco: None  . Alcohol Use: 7.2 oz/week    12 Cans of beer per week     Comment: occ    Review of Systems ROS: Statement: All systems negative except as marked or noted in the HPI; Constitutional: Negative for fever and chills. ; ; Eyes: Negative for eye pain, redness and discharge. ; ; ENMT: Negative for ear pain, hoarseness, nasal congestion, sinus pressure and sore throat. ; ; Cardiovascular: Negative for chest pain, palpitations, diaphoresis, dyspnea and peripheral edema. ; ; Respiratory: Negative for cough, wheezing and stridor. ; ; Gastrointestinal: +suprapubic discomfort. Negative for nausea, vomiting, diarrhea, blood in stool, hematemesis, jaundice and rectal bleeding. . ; ; Genitourinary: +urinary dribbling and retention. Negative for dysuria, flank pain and hematuria. ; ; Genital:  No penile drainage or rash, no testicular pain or swelling, no scrotal rash or swelling. ;; Musculoskeletal: Negative for  back pain and neck pain. Negative for swelling and trauma.; ; Skin: Negative for pruritus, rash, abrasions, blisters, bruising and skin lesion.; ; Neuro: Negative for headache, lightheadedness and neck stiffness. Negative for weakness, altered level of consciousness , altered mental status, extremity weakness, paresthesias, involuntary movement, seizure and syncope.      Allergies  Review of patient's allergies indicates no known allergies.  Home Medications   Prior to Admission medications   Medication Sig Start Date End Date Taking? Authorizing Provider  Ascorbic Acid (VITAMIN C PO) Take 1 tablet by mouth daily.   Yes Historical Provider, MD  B Complex-Biotin-FA (B-COMPLEX PO) Take 1 tablet by mouth daily.   Yes Historical Provider, MD  Cholecalciferol (VITAMIN D PO) Take 1 tablet by mouth daily.   Yes Historical Provider, MD  megestrol (MEGACE ES) 625 MG/5ML suspension Take 5 mLs (625 mg total) by mouth daily. 05/02/15  Yes Curt Bears, MD  Multiple Vitamin (MULTIVITAMIN WITH MINERALS) TABS tablet Take 1 tablet by mouth daily.   Yes Historical Provider, MD  oxyCODONE (ROXICODONE) 15 MG immediate release tablet Take 1 tablet (15 mg total) by mouth every 4 (four) hours as needed for pain. 05/17/15  Yes Gery Pray, MD  polyethylene glycol [facility] / GLYCOLAX) packet Take 17 g by mouth 2 (two) times daily.   Yes Historical Provider, MD  traZODone (DESYREL) 50 MG tablet Take 1 tablet (50 mg total) by mouth at bedtime as needed and may repeat dose one time if needed  for sleep. Patient taking differently: Take 25-50 mg by mouth at bedtime as needed and may repeat dose one time if needed for sleep.  11/11/12  Yes Niel Hummer, NP  levofloxacin (LEVAQUIN) 750 MG tablet Take 1 tablet (750 mg total) by mouth daily. Patient not taking: Reported on 05/23/2015 05/12/15   Erline Hau, MD   BP 104/76 mmHg  Pulse 66  Temp(Src) 98.7 F (37.1 C) (Oral)  Resp 18  Ht 6' (1.829 m)  Wt  147 lb (66.679 kg)  BMI 19.93 kg/m2  SpO2 98% Physical Exam  1425: Physical examination:  Nursing notes reviewed; Vital signs and O2 SAT reviewed;  Constitutional: Well developed, Well nourished, Well hydrated, In no acute distress; Head:  Normocephalic, atraumatic; Eyes: EOMI, PERRL, No scleral icterus; ENMT: Mouth and pharynx normal, Mucous membranes moist; Neck: Supple, Full range of motion, No lymphadenopathy; Cardiovascular: Regular rate and rhythm, No gallop; Respiratory: Breath sounds clear & equal bilaterally, No wheezes. Speaking full sentences with ease, Normal respiratory effort/excursion; Chest: Nontender, Movement normal; Abdomen: Soft, +suprapubic tenderness with distention. Normal bowel sounds; Genitourinary: No CVA tenderness; Extremities: Pulses normal, No tenderness, No edema, No calf edema or asymmetry.; Neuro: AA&Ox3, Major CN grossly intact.  Speech clear. No gross focal motor or sensory deficits in extremities.; Skin: Color normal, Warm, Dry.    ED Course  Procedures (including critical care time) Labs Review   Imaging Review  I have personally reviewed and evaluated these images and lab results as part of my medical decision-making.   EKG Interpretation None      MDM  MDM Reviewed: previous chart, nursing note and vitals Reviewed previous: labs Interpretation: labs and CT scan   Results for orders placed or performed during the hospital encounter of 05/23/15  Urinalysis, Routine w reflex microscopic-may I&O cath if menses (not at [facility])  Result Value Ref Range   Color, Urine YELLOW YELLOW   APPearance CLEAR CLEAR   Specific Gravity, Urine 1.015 1.005 - 1.030   pH 7.0 5.0 - 8.0   Glucose, UA NEGATIVE NEGATIVE mg/dL   Hgb urine dipstick NEGATIVE NEGATIVE   Bilirubin Urine NEGATIVE NEGATIVE   Ketones, ur NEGATIVE NEGATIVE mg/dL   Protein, ur NEGATIVE NEGATIVE mg/dL   Nitrite NEGATIVE NEGATIVE   Leukocytes, UA NEGATIVE NEGATIVE  Basic metabolic panel   Result Value Ref Range   Sodium 134 (L) 135 - 145 mmol/L   Potassium 4.2 3.5 - 5.1 mmol/L   Chloride 100 (L) 101 - 111 mmol/L   CO2 26 22 - 32 mmol/L   Glucose, Bld 82 65 - 99 mg/dL   BUN 20 6 - 20 mg/dL   Creatinine, Ser 0.78 0.61 - 1.24 mg/dL   Calcium 9.0 8.9 - 10.3 mg/dL   GFR calc non Af Amer >60 >60 mL/min   GFR calc Af Amer >60 >60 mL/min   Anion gap 8 5 - 15  CBC with Differential  Result Value Ref Range   WBC 4.8 4.0 - 10.5 K/uL   RBC 3.59 (L) 4.22 - 5.81 MIL/uL   Hemoglobin 10.5 (L) 13.0 - 17.0 g/dL   HCT 31.2 (L) 39.0 - 52.0 %   MCV 86.9 78.0 - 100.0 fL   MCH 29.2 26.0 - 34.0 pg   MCHC 33.7 30.0 - 36.0 g/dL   RDW 14.0 11.5 - 15.5 %   Platelets 225 150 - 400 K/uL   Neutrophils Relative % 45 %   Neutro Abs 2.2 1.7 - 7.7 K/uL  Lymphocytes Relative 41 %   Lymphs Abs 2.0 0.7 - 4.0 K/uL   Monocytes Relative 7 %   Monocytes Absolute 0.4 0.1 - 1.0 K/uL   Eosinophils Relative 7 %   Eosinophils Absolute 0.3 0.0 - 0.7 K/uL   Basophils Relative 0 %   Basophils Absolute 0.0 0.0 - 0.1 K/uL   Ct Abdomen Pelvis W Contrast 05/23/2015  CLINICAL DATA:  Suprapubic pain, urinary retention x5 days. Newly diagnosed lung cancer with metastases to adrenal glands and lymph nodes. EXAM: CT ABDOMEN AND PELVIS WITH CONTRAST TECHNIQUE: Multidetector CT imaging of the abdomen and pelvis was performed using the standard protocol following bolus administration of intravenous contrast. CONTRAST:  172m OMNIPAQUE IOHEXOL 300 MG/ML  SOLN COMPARISON:  PET-CT dated 03/28/2015.  CTA chest dated 05/09/2015. FINDINGS: Lower chest: Thick-walled cavitary mass with air-fluid level in the right lower lobe, measuring 5.3 x 4.4 cm, previously 8.1 x 7.5 cm on recent CT chest. 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 Small right pleural effusion. Hepatobiliary: Liver is within normal  limits. Gallbladder is unremarkable. No intrahepatic or extrahepatic ductal dilatation. Pancreas: Within normal limits. Spleen: Two lesions in the anterior spleen measuring up to 1.5 cm, previously 2.2 cm, at least one of which was hypermetabolic and likely reflects a metastasis. Adrenals/Urinary Tract: Bilateral adrenal metastases, measuring 4.9 x 3.1 cm on the right (previously 5.2 cm) and 2.8 x 1.7 cm on the left (previously 3.6 cm). Kidneys are within normal limits.  No hydronephrosis. Bladder is moderately distended. Stomach/Bowel: Stomach is within normal limits. No evidence of bowel obstruction. Appendix is not discretely visualized. Vascular/Lymphatic: Atherosclerotic calcifications of the abdominal aorta and branch vessels. 12 mm short axis gastrohepatic node (series 2/ image 15), previously 19 mm. 9 mm short axis portacaval node (series 2/image 21) and 7 mm short axis left para-aortic node (series 2/ image 29), within the upper limits of normal. Reproductive: Mild prostatomegaly. Other: No abdominopelvic ascites. Musculoskeletal: Degenerative changes of the visualized thoracolumbar spine, most prominent at L5-S1. IMPRESSION: Bladder is moderately distended.  Mild prostatomegaly. 5.3 x 4.4 cm thick-walled cavitary mass in the right lower lobe, corresponding to known primary bronchogenic neoplasm, decreased. 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 Small right pleural effusion. Bilateral adrenal and splenic metastases, improved. Gastrohepatic nodal metastasis, improved. Electronically Signed   By: SJulian HyM.D.   On: 05/23/2015 17:12    1620:  Unable to pass foley or coude catheter due to significant resistence.  T/C to Uro Dr. GRisa Grill case discussed, including:  HPI, pertinent PM/SHx, VS/PE, dx testing, ED course and treatment:  Agreeable to come to ED for evaluation.   1820:  H/H per baseline. Uro MD has placed foley. Pt and family  would like to go home now. Dx and testing d/w pt and family.  Questions answered.  Verb understanding, agreeable to d/c home with outpt f/u.   KFrancine Graven DO 05/25/15 2042

## 2015-05-24 LAB — URINE CULTURE: Culture: NO GROWTH

## 2015-05-25 ENCOUNTER — Inpatient Hospital Stay (HOSPITAL_COMMUNITY)
Admission: EM | Admit: 2015-05-25 | Discharge: 2015-05-30 | DRG: 543 | Disposition: A | Discharge status: Hospice | Payer: Medicaid Other | Attending: Internal Medicine | Admitting: Internal Medicine

## 2015-05-25 ENCOUNTER — Encounter (HOSPITAL_COMMUNITY): Payer: Self-pay | Admitting: *Deleted

## 2015-05-25 ENCOUNTER — Emergency Department (HOSPITAL_COMMUNITY): Payer: Medicaid Other

## 2015-05-25 ENCOUNTER — Telehealth: Payer: Self-pay | Admitting: *Deleted

## 2015-05-25 DIAGNOSIS — C3431 Malignant neoplasm of lower lobe, right bronchus or lung: Secondary | ICD-10-CM | POA: Diagnosis present

## 2015-05-25 DIAGNOSIS — C7971 Secondary malignant neoplasm of right adrenal gland: Secondary | ICD-10-CM | POA: Diagnosis present

## 2015-05-25 DIAGNOSIS — J449 Chronic obstructive pulmonary disease, unspecified: Secondary | ICD-10-CM | POA: Diagnosis present

## 2015-05-25 DIAGNOSIS — M5125 Other intervertebral disc displacement, thoracolumbar region: Secondary | ICD-10-CM | POA: Diagnosis present

## 2015-05-25 DIAGNOSIS — D649 Anemia, unspecified: Secondary | ICD-10-CM | POA: Diagnosis present

## 2015-05-25 DIAGNOSIS — C773 Secondary and unspecified malignant neoplasm of axilla and upper limb lymph nodes: Secondary | ICD-10-CM | POA: Diagnosis present

## 2015-05-25 DIAGNOSIS — M549 Dorsalgia, unspecified: Secondary | ICD-10-CM | POA: Diagnosis present

## 2015-05-25 DIAGNOSIS — C7970 Secondary malignant neoplasm of unspecified adrenal gland: Secondary | ICD-10-CM | POA: Diagnosis present

## 2015-05-25 DIAGNOSIS — Z923 Personal history of irradiation: Secondary | ICD-10-CM

## 2015-05-25 DIAGNOSIS — C770 Secondary and unspecified malignant neoplasm of lymph nodes of head, face and neck: Secondary | ICD-10-CM | POA: Diagnosis present

## 2015-05-25 DIAGNOSIS — K5900 Constipation, unspecified: Secondary | ICD-10-CM | POA: Diagnosis present

## 2015-05-25 DIAGNOSIS — Z66 Do not resuscitate: Secondary | ICD-10-CM | POA: Diagnosis present

## 2015-05-25 DIAGNOSIS — Z515 Encounter for palliative care: Secondary | ICD-10-CM | POA: Diagnosis present

## 2015-05-25 DIAGNOSIS — R29898 Other symptoms and signs involving the musculoskeletal system: Secondary | ICD-10-CM

## 2015-05-25 DIAGNOSIS — F419 Anxiety disorder, unspecified: Secondary | ICD-10-CM | POA: Diagnosis present

## 2015-05-25 DIAGNOSIS — N401 Enlarged prostate with lower urinary tract symptoms: Secondary | ICD-10-CM | POA: Diagnosis present

## 2015-05-25 DIAGNOSIS — C3430 Malignant neoplasm of lower lobe, unspecified bronchus or lung: Secondary | ICD-10-CM | POA: Diagnosis present

## 2015-05-25 DIAGNOSIS — G893 Neoplasm related pain (acute) (chronic): Secondary | ICD-10-CM | POA: Diagnosis present

## 2015-05-25 DIAGNOSIS — R200 Anesthesia of skin: Secondary | ICD-10-CM | POA: Diagnosis present

## 2015-05-25 DIAGNOSIS — C7972 Secondary malignant neoplasm of left adrenal gland: Secondary | ICD-10-CM | POA: Diagnosis present

## 2015-05-25 DIAGNOSIS — R531 Weakness: Secondary | ICD-10-CM

## 2015-05-25 DIAGNOSIS — C7951 Secondary malignant neoplasm of bone: Principal | ICD-10-CM | POA: Diagnosis present

## 2015-05-25 DIAGNOSIS — C772 Secondary and unspecified malignant neoplasm of intra-abdominal lymph nodes: Secondary | ICD-10-CM | POA: Diagnosis present

## 2015-05-25 DIAGNOSIS — R338 Other retention of urine: Secondary | ICD-10-CM | POA: Diagnosis present

## 2015-05-25 DIAGNOSIS — G839 Paralytic syndrome, unspecified: Secondary | ICD-10-CM | POA: Diagnosis present

## 2015-05-25 DIAGNOSIS — M5136 Other intervertebral disc degeneration, lumbar region: Secondary | ICD-10-CM | POA: Diagnosis present

## 2015-05-25 DIAGNOSIS — R0602 Shortness of breath: Secondary | ICD-10-CM

## 2015-05-25 DIAGNOSIS — G4700 Insomnia, unspecified: Secondary | ICD-10-CM | POA: Diagnosis present

## 2015-05-25 DIAGNOSIS — C799 Secondary malignant neoplasm of unspecified site: Secondary | ICD-10-CM

## 2015-05-25 DIAGNOSIS — Z87891 Personal history of nicotine dependence: Secondary | ICD-10-CM

## 2015-05-25 HISTORY — DX: Methicillin resistant Staphylococcus aureus infection, unspecified site: A49.02

## 2015-05-25 HISTORY — DX: Benign prostatic hyperplasia without lower urinary tract symptoms: N40.0

## 2015-05-25 LAB — URINE MICROSCOPIC-ADD ON
Bacteria, UA: NONE SEEN
Squamous Epithelial / LPF: NONE SEEN

## 2015-05-25 LAB — COMPREHENSIVE METABOLIC PANEL
ALT: 18 U/L (ref 17–63)
AST: 18 U/L (ref 15–41)
Albumin: 3.2 g/dL — ABNORMAL LOW (ref 3.5–5.0)
Alkaline Phosphatase: 73 U/L (ref 38–126)
Anion gap: 8 (ref 5–15)
BUN: 16 mg/dL (ref 6–20)
CO2: 24 mmol/L (ref 22–32)
Calcium: 9.4 mg/dL (ref 8.9–10.3)
Chloride: 102 mmol/L (ref 101–111)
Creatinine, Ser: 0.67 mg/dL (ref 0.61–1.24)
GFR calc Af Amer: >60 mL/min (ref >60)
GFR calc non Af Amer: >60 mL/min (ref >60)
Glucose, Bld: 97 mg/dL (ref 65–99)
Potassium: 4.5 mmol/L (ref 3.5–5.1)
Sodium: 134 mmol/L — ABNORMAL LOW (ref 135–145)
Total Bilirubin: 0.5 mg/dL (ref 0.3–1.2)
Total Protein: 6.8 g/dL (ref 6.5–8.1)

## 2015-05-25 LAB — CBC WITH DIFFERENTIAL/PLATELET
Basophils Absolute: 0 10*3/uL (ref 0.0–0.1)
Basophils Relative: 0 %
Eosinophils Absolute: 0.3 10*3/uL (ref 0.0–0.7)
Eosinophils Relative: 5 %
HCT: 36.7 % — ABNORMAL LOW (ref 39.0–52.0)
Hemoglobin: 11.7 g/dL — ABNORMAL LOW (ref 13.0–17.0)
Lymphocytes Relative: 39 %
Lymphs Abs: 2.6 10*3/uL (ref 0.7–4.0)
MCH: 27.9 pg (ref 26.0–34.0)
MCHC: 31.9 g/dL (ref 30.0–36.0)
MCV: 87.6 fL (ref 78.0–100.0)
Monocytes Absolute: 0.6 10*3/uL (ref 0.1–1.0)
Monocytes Relative: 9 %
Neutro Abs: 3.2 10*3/uL (ref 1.7–7.7)
Neutrophils Relative %: 47 %
Platelets: 200 10*3/uL (ref 150–400)
RBC: 4.19 MIL/uL — ABNORMAL LOW (ref 4.22–5.81)
RDW: 14.4 % (ref 11.5–15.5)
WBC: 6.6 10*3/uL (ref 4.0–10.5)

## 2015-05-25 LAB — URINALYSIS, ROUTINE W REFLEX MICROSCOPIC
Bilirubin Urine: NEGATIVE
Glucose, UA: NEGATIVE mg/dL
Ketones, ur: NEGATIVE mg/dL
Nitrite: NEGATIVE
Protein, ur: NEGATIVE mg/dL
Specific Gravity, Urine: 1.012 (ref 1.005–1.030)
pH: 6.5 (ref 5.0–8.0)

## 2015-05-25 MED ORDER — ENOXAPARIN SODIUM 40 MG/0.4ML ~~LOC~~ SOLN
40.0000 mg | Freq: Every day | SUBCUTANEOUS | Status: DC
  Administered 2015-05-25 – 2015-05-29 (×5): 40 mg via SUBCUTANEOUS
  Filled 2015-05-25 (×5): qty 0.4

## 2015-05-25 MED ORDER — FENTANYL 25 MCG/HR TD PT72
25.0000 ug | MEDICATED_PATCH | TRANSDERMAL | Status: DC
  Administered 2015-05-25 – 2015-05-28 (×2): 25 ug via TRANSDERMAL
  Filled 2015-05-25 (×2): qty 1

## 2015-05-25 MED ORDER — GADOBENATE DIMEGLUMINE 529 MG/ML IV SOLN
15.0000 mL | Freq: Once | INTRAVENOUS | Status: AC | PRN
  Administered 2015-05-25: 14 mL via INTRAVENOUS

## 2015-05-25 MED ORDER — SODIUM CHLORIDE 0.9 % IV SOLN
INTRAVENOUS | Status: DC
  Administered 2015-05-25 – 2015-05-29 (×4): via INTRAVENOUS

## 2015-05-25 MED ORDER — TAMSULOSIN HCL 0.4 MG PO CAPS
0.4000 mg | ORAL_CAPSULE | Freq: Every day | ORAL | Status: DC
  Administered 2015-05-25 – 2015-05-29 (×5): 0.4 mg via ORAL
  Filled 2015-05-25 (×5): qty 1

## 2015-05-25 MED ORDER — ADULT MULTIVITAMIN W/MINERALS CH
1.0000 | ORAL_TABLET | Freq: Every day | ORAL | Status: DC
  Administered 2015-05-25 – 2015-05-30 (×6): 1 via ORAL
  Filled 2015-05-25 (×7): qty 1

## 2015-05-25 MED ORDER — OXYCODONE HCL 5 MG PO TABS
15.0000 mg | ORAL_TABLET | ORAL | Status: DC | PRN
  Administered 2015-05-26 – 2015-05-28 (×5): 15 mg via ORAL
  Filled 2015-05-25 (×5): qty 3

## 2015-05-25 MED ORDER — ONDANSETRON HCL 4 MG/2ML IJ SOLN
4.0000 mg | Freq: Four times a day (QID) | INTRAMUSCULAR | Status: DC | PRN
Start: 2015-05-25 — End: 2015-05-30
  Administered 2015-05-29: 4 mg via INTRAVENOUS
  Filled 2015-05-25: qty 2

## 2015-05-25 MED ORDER — ONDANSETRON HCL 4 MG PO TABS: 4.0000 mg | ORAL_TABLET | Freq: Four times a day (QID) | ORAL | Status: DC | PRN

## 2015-05-25 MED ORDER — MEGESTROL ACETATE 625 MG/5ML PO SUSP: 625.0000 mg | Freq: Every day | ORAL | Status: DC

## 2015-05-25 MED ORDER — LORAZEPAM 2 MG/ML IJ SOLN
2.0000 mg | Freq: Once | INTRAMUSCULAR | Status: AC
  Administered 2015-05-25: 2 mg via INTRAVENOUS
  Filled 2015-05-25: qty 1

## 2015-05-25 MED ORDER — TRAZODONE HCL 50 MG PO TABS
50.0000 mg | ORAL_TABLET | Freq: Every evening | ORAL | Status: DC | PRN
Start: 2015-05-25 — End: 2015-05-27
  Administered 2015-05-25 – 2015-05-26 (×2): 50 mg via ORAL
  Filled 2015-05-25 (×2): qty 1

## 2015-05-25 MED ORDER — IPRATROPIUM-ALBUTEROL 0.5-2.5 (3) MG/3ML IN SOLN: 3.0000 mL | Freq: Four times a day (QID) | RESPIRATORY_TRACT | Status: DC | PRN

## 2015-05-25 MED ORDER — IPRATROPIUM-ALBUTEROL 0.5-2.5 (3) MG/3ML IN SOLN
3.0000 mL | Freq: Once | RESPIRATORY_TRACT | Status: AC
  Administered 2015-05-25: 3 mL via RESPIRATORY_TRACT
  Filled 2015-05-25: qty 3

## 2015-05-25 MED ORDER — MEGESTROL ACETATE 40 MG/ML PO SUSP
400.0000 mg | Freq: Every day | ORAL | Status: DC
  Administered 2015-05-26 – 2015-05-30 (×5): 400 mg via ORAL
  Filled 2015-05-25 (×5): qty 10

## 2015-05-25 MED ORDER — HYDROMORPHONE HCL 1 MG/ML IJ SOLN
1.0000 mg | INTRAMUSCULAR | Status: DC | PRN
  Administered 2015-05-25 – 2015-05-30 (×17): 1 mg via INTRAVENOUS
  Filled 2015-05-25 (×17): qty 1

## 2015-05-25 MED ORDER — POLYETHYLENE GLYCOL 3350 17 G PO PACK
17.0000 g | PACK | Freq: Two times a day (BID) | ORAL | Status: DC
  Administered 2015-05-25 – 2015-05-28 (×7): 17 g via ORAL
  Filled 2015-05-25 (×8): qty 1

## 2015-05-25 MED ORDER — PANTOPRAZOLE SODIUM 40 MG IV SOLR
40.0000 mg | Freq: Once | INTRAVENOUS | Status: AC
  Administered 2015-05-25: 40 mg via INTRAVENOUS
  Filled 2015-05-25: qty 40

## 2015-05-25 MED ORDER — SODIUM CHLORIDE 0.9 % IV BOLUS (SEPSIS)
1000.0000 mL | Freq: Once | INTRAVENOUS | Status: AC
Start: 2015-05-25 — End: 2015-05-25
  Administered 2015-05-25: 1000 mL via INTRAVENOUS

## 2015-05-25 NOTE — ED Notes (Signed)
Informed MD of patient's blood pressure.

## 2015-05-25 NOTE — ED Notes (Signed)
Report given to Al, Therapist, sports.

## 2015-05-25 NOTE — Telephone Encounter (Signed)
TC from patient's wife, [name]. She wanted to let us know that they are in the ED @ [facility]. She states that there are a lot of patient's ahead of him. Instructed wife to show pt's chemo alert card to ED staff to help facilitate movement through ED.

## 2015-05-25 NOTE — ED Provider Notes (Signed)
CSN: [id number]     Arrival date & time 05/25/15  1139 History   First MD Initiated Contact with Patient 05/25/15 1324     Chief Complaint  Patient presents with  . Ca pt, weakness       HPI   59 -year-old male with a history of lung cancer with cervical metastases presents today with increasing weakness. Patient reports a history of lung cancer that has spread to his cervical spine. He received MRI of the cervical spine on 03/07/2015 due to upper extremity weakness and paresthesias. He was found to have a lesion at the right C6 vertebral body. At that time he received palliative radiation for symptom control. He reports over the last several weeks he's had lower extremity weakness causing falls. He notes over the last 2 days the lower extremities have dramatically worsened to the point where he is unable to ambulate without assistance. Additionally he notes restless legs. Patient denies any fever, chills, nausea or vomiting. Currently receiving immunotherapy of Keytruda. Oncologist Dr. Inda Merlin    Past Medical History  Diagnosis Date  . Radiation 03/30/15 - 04/14/15    cervical spine area 30 Gy  . Cancer ([place])     Lung  . COPD (chronic obstructive pulmonary disease) ([place])   . Metastatic cancer ([place])    No past surgical history on file. No family history on file. Social History  Substance Use Topics  . Smoking status: Former Smoker -- 1.00 packs/day    Types: Cigarettes  . Smokeless tobacco: Not on file  . Alcohol Use: 7.2 oz/week    12 Cans of beer per week     Comment: occ    Review of Systems  All other systems reviewed and are negative.     Allergies  Review of patient's allergies indicates no known allergies.  Home Medications   Prior to Admission medications   Medication Sig Start Date End Date Taking? Authorizing Provider  Ascorbic Acid (VITAMIN C PO) Take 1 tablet by mouth daily.   Yes Historical Provider, MD  B Complex-Biotin-FA (B-COMPLEX PO) Take 1 tablet  by mouth daily.   Yes Historical Provider, MD  Cholecalciferol (VITAMIN D PO) Take 1 tablet by mouth daily.   Yes Historical Provider, MD  megestrol (MEGACE ES) 625 MG/5ML suspension Take 5 mLs (625 mg total) by mouth daily. 05/02/15  Yes Curt Bears, MD  Multiple Vitamin (MULTIVITAMIN WITH MINERALS) TABS tablet Take 1 tablet by mouth daily.   Yes Historical Provider, MD  oxyCODONE (ROXICODONE) 15 MG immediate release tablet Take 1 tablet (15 mg total) by mouth every 4 (four) hours as needed for pain. 05/17/15  Yes Gery Pray, MD  polyethylene glycol [facility] / GLYCOLAX) packet Take 17 g by mouth 2 (two) times daily.   Yes Historical Provider, MD  tamsulosin (FLOMAX) 0.4 MG CAPS capsule Take 0.4 mg by mouth daily after supper.   Yes Historical Provider, MD  traZODone (DESYREL) 50 MG tablet Take 1 tablet (50 mg total) by mouth at bedtime as needed and may repeat dose one time if needed for sleep. Patient taking differently: Take 25-50 mg by mouth at bedtime as needed and may repeat dose one time if needed for sleep.  11/11/12  Yes Niel Hummer, NP  levofloxacin (LEVAQUIN) 750 MG tablet Take 1 tablet (750 mg total) by mouth daily. Patient not taking: Reported on 05/23/2015 05/12/15   Erline Hau, MD   BP 128/86 mmHg  Pulse 65  Temp(Src) 97.9 F (  36.6 C) (Oral)  Resp 26  SpO2 100%   Physical Exam  Constitutional: He is oriented to person, place, and time. He appears well-developed.  HENT:  Head: Normocephalic and atraumatic.  Eyes: Conjunctivae are normal. Pupils are equal, round, and reactive to light. Right eye exhibits no discharge. Left eye exhibits no discharge. No scleral icterus.  Neck: Normal range of motion. No JVD present. No tracheal deviation present.  Cardiovascular: Normal rate, regular rhythm, normal heart sounds and intact distal pulses.   Pulmonary/Chest: Effort normal. No stridor.  Musculoskeletal: Normal range of motion. He exhibits no edema or  tenderness.  Tenderness to palpation of the cervical spine and surrounding soft tissue, no thoracic or lumbar spinal tenderness.  Neurological: He is alert and oriented to person, place, and time. He displays no tremor. No cranial nerve deficit or sensory deficit. Coordination normal. GCS eye subscore is 4. GCS verbal subscore is 5. GCS motor subscore is 6.  Reflex Scores:      Patellar reflexes are 2+ on the right side and 2+ on the left side. 4/5 strength of lower extremities bilateral. Dorsiflexion 3/5, plantar 5/5. Patellar reflexes 2+. Sensation grossly intact.   Skin: Skin is warm and dry. No rash noted. No erythema. No pallor.  Psychiatric: He has a normal mood and affect. His behavior is normal. Judgment and thought content normal.  Nursing note and vitals reviewed.    ED Course  Procedures (including critical care time) Labs Review Labs Reviewed  CBC WITH DIFFERENTIAL/PLATELET - Abnormal; Notable for the following:    RBC 4.19 (*)    Hemoglobin 11.7 (*)    HCT 36.7 (*)    All other components within normal limits  COMPREHENSIVE METABOLIC PANEL - Abnormal; Notable for the following:    Sodium 134 (*)    Albumin 3.2 (*)    All other components within normal limits  URINALYSIS, ROUTINE W REFLEX MICROSCOPIC (NOT AT [facility])    Imaging Review Ct Abdomen Pelvis W Contrast  05/23/2015  CLINICAL DATA:  Suprapubic pain, urinary retention x5 days. Newly diagnosed lung cancer with metastases to adrenal glands and lymph nodes. EXAM: CT ABDOMEN AND PELVIS WITH CONTRAST TECHNIQUE: Multidetector CT imaging of the abdomen and pelvis was performed using the standard protocol following bolus administration of intravenous contrast. CONTRAST:  130m OMNIPAQUE IOHEXOL 300 MG/ML  SOLN COMPARISON:  PET-CT dated 03/28/2015.  CTA chest dated 05/09/2015. FINDINGS: Lower chest: Thick-walled cavitary mass with air-fluid level in the right lower lobe, measuring 5.3 x 4.4 cm, previously 8.1 x 7.5 cm on recent  CT chest. 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 Small right pleural effusion. Hepatobiliary: Liver is within normal limits. Gallbladder is unremarkable. No intrahepatic or extrahepatic ductal dilatation. Pancreas: Within normal limits. Spleen: Two lesions in the anterior spleen measuring up to 1.5 cm, previously 2.2 cm, at least one of which was hypermetabolic and likely reflects a metastasis. Adrenals/Urinary Tract: Bilateral adrenal metastases, measuring 4.9 x 3.1 cm on the right (previously 5.2 cm) and 2.8 x 1.7 cm on the left (previously 3.6 cm). Kidneys are within normal limits.  No hydronephrosis. Bladder is moderately distended. Stomach/Bowel: Stomach is within normal limits. No evidence of bowel obstruction. Appendix is not discretely visualized. Vascular/Lymphatic: Atherosclerotic calcifications of the abdominal aorta and branch vessels. 12 mm short axis gastrohepatic node (series 2/ image 15), previously 19 mm. 9 mm short axis portacaval  node (series 2/image 21) and 7 mm short axis left para-aortic node (series 2/ image 29), within the upper limits of normal. Reproductive: Mild prostatomegaly. Other: No abdominopelvic ascites. Musculoskeletal: Degenerative changes of the visualized thoracolumbar spine, most prominent at L5-S1. IMPRESSION: Bladder is moderately distended.  Mild prostatomegaly. 5.3 x 4.4 cm thick-walled cavitary mass in the right lower lobe, corresponding to known primary bronchogenic neoplasm, decreased. 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 Small right pleural effusion. Bilateral adrenal and splenic metastases, improved. Gastrohepatic nodal metastasis, improved. Electronically Signed   By: Julian Hy M.D.   On: 05/23/2015 17:12   I have personally reviewed and evaluated these images and  lab results as part of my medical decision-making.   EKG Interpretation None      MDM   Final diagnoses:  Weakness    Labs: CBC, CMP- no significant findings  Imaging:   Consults: Oncology  Therapeutics: Ativan  Discharge Meds:   Assessment/Plan: 30-year-old male with a history of lung cancer with metastases to the spine presents today with worsening weakness of lower extremity is. Patient has had several weeks of weakness, previous notes no complaints of the same. Patient reports over the last several days he's had an acute worsening of this weakness with inability to ambulate without assistance. Patient denies significant pain to the lower extremities her lower back but notes "restlessness" to the lower extremity is bilateral. Oncology was consult and who instructed to have MRI of lumbar spine completed for further evaluation for possible metastases. No recommendations for cervical evaluation at this time. Patient will receive MRI study this point pending results.         Okey Regal, PA-C 05/25/15 1651  Leonard Schwartz, MD 06/03/15 [phone]

## 2015-05-25 NOTE — H&P (Signed)
[facility] History and Physical  ANTOINNE Cook QMV:[id number] DOB: 08-Oct-1955 DOA: 05/25/2015  Referring physician: Zacarias Pontes, PA-C PCP: No PCP Per Patient   Chief Complaint: Back pain and lower extremities weakness.  Source of history: Patient's wife Barry Cook) and the patient.   HPI: Barry Cook is a 59 y.o. 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 He notices that in the last 2 days that his lower extremities weakness has worsened to the point that he is unable to ambulate without assistance. He reports constipation of several days and had to go to the [facility] emergency department on Monday due to a 5 day long gradual onset urinary retention. The patient currently has a Foley catheter.   Per patient's wife and Barry Cook,  he was diagnosed on 03/07/2015 with lung cancer after MRI, which was ordered due to bilateral hand tingling, showed an metastatic lesions on C6. He received palliative radiation for this lesion and denies further worsening.    When seen in the emergency department, the patient was sleeping and hypotensive due to receiving Ativan prior to MRI scanning of his lumbosacral spine. The patient subsequently woke up and was able to provide some history. Blood pressure improved with 1 L normal saline bolus. He is currently in no acute distress.  Review of Systems:  Constitutional:  Positive for chills, fatigue.  No weight loss, night sweats, Fevers. HEENT:   positive frontal headache and nasal congestion since this morning. The patient used his nonhumidified oxygen via nasal cannula all night last night, which he usually does not do. Difficulty swallowing,Tooth/dental problems,Sore throat,  No  sneezing, itching, ear ache,  post nasal drip,  Cardio-vascular:  No chest pain, Orthopnea, PND, swelling in lower extremities, anasarca, dizziness, palpitations  GI:  Positive heartburn, nausea and constipation. Since taking Megace, the patient's loss of appetite has improved. No  indigestion, abdominal pain,  vomiting, diarrhea. Resp:   Positive dyspnea, occasionally productive cough and wheezing. No hemoptysis. Skin:  no rash or lesions.  GU:  Positive urinary retention since Monday requiring placement of Foley catheter.  no dysuria, change in color of urine, no urgency or frequency. No flank pain.  Musculoskeletal: Positive lower extremity weakness and back pain.  Psych:  No change in mood or affect. No depression or anxiety. No memory loss.  Neuro: As above mentioned.  Past Medical History  Diagnosis Date  . Radiation 03/30/15 - 04/14/15    cervical spine area 30 Gy  . COPD (chronic obstructive pulmonary disease) ([place])   . Enlarged prostate   . Cancer ([place])     Lung  . Metastatic cancer ([place])   . MRSA (methicillin resistant Staphylococcus aureus)    Past Surgical History  Procedure Laterality Date  . Cyst excision     Social History:  reports that he has quit smoking. His smoking use included Cigarettes. He smoked 1.00 pack per day. He has never used smokeless tobacco. He reports that he drinks about 7.2 oz of alcohol per week. He reports that he uses illicit drugs (Cocaine).  No Known Allergies  Family History  Problem Relation Age of Onset  . Throat cancer Brother     Prior to Admission medications   Medication Sig Start Date End Date Taking? Authorizing  Provider  Ascorbic Acid (VITAMIN C PO) Take 1 tablet by mouth daily.   Yes Historical Provider, MD  B Complex-Biotin-FA (B-COMPLEX PO) Take 1 tablet by mouth daily.   Yes Historical Provider, MD  Cholecalciferol (VITAMIN D PO) Take 1 tablet by mouth daily.   Yes Historical Provider, MD  megestrol (MEGACE ES) 625  MG/5ML suspension Take 5 mLs (625 mg total) by mouth daily. 05/02/15  Yes Curt Bears, MD  Multiple Vitamin (MULTIVITAMIN WITH MINERALS) TABS tablet Take 1 tablet by mouth daily.   Yes Historical Provider, MD  oxyCODONE (ROXICODONE) 15 MG immediate release tablet Take 1 tablet (15 mg total) by mouth every 4 (four) hours as needed for pain. 05/17/15  Yes Gery Pray, MD  polyethylene glycol [facility] / GLYCOLAX) packet Take 17 g by mouth 2 (two) times daily.   Yes Historical Provider, MD  tamsulosin (FLOMAX) 0.4 MG CAPS capsule Take 0.4 mg by mouth daily after supper.   Yes Historical Provider, MD  traZODone (DESYREL) 50 MG tablet Take 1 tablet (50 mg total) by mouth at bedtime as needed and may repeat dose one time if needed for sleep. Patient taking differently: Take 25-50 mg by mouth at bedtime as needed and may repeat dose one time if needed for sleep.  11/11/12  Yes Niel Hummer, NP  levofloxacin (LEVAQUIN) 750 MG tablet Take 1 tablet (750 mg total) by mouth daily. Patient not taking: Reported on 05/23/2015 05/12/15   Erline Hau, MD   Physical Exam: Filed Vitals:   05/25/15 1900 05/25/15 1937 05/25/15 2000 05/25/15 2046  BP: 91/59 87/71 102/75 113/79  Pulse:  99 80 82  Temp:  98.7 F (37.1 C)    TempSrc:  Oral    Resp: '15 21 15 25  '$ SpO2:  95% 95% 96%    Wt Readings from Last 3 Encounters:  05/25/15 66.679 kg (147 lb)  05/23/15 66.679 kg (147 lb)  05/18/15 67.087 kg (147 lb 14.4 oz)    General:  Appears calm and comfortable Eyes: PERRL, normal lids, irises & conjunctiva ENT: grossly normal hearing, lips and oral mucosae are dry. Neck: no LAD, masses or thyromegaly Cardiovascular: RRR, no m/r/g. No LE edema. Telemetry: SR, no arrhythmias  Respiratory: Bilateral wheezing and rhonchi. No accessory muscle use. Abdomen: soft, ntnd Skin: no rash or induration seen on limited exam Musculoskeletal:  decreased muscle tone bilaterally on LE Psychiatric: grossly normal  mood and affect, speech fluent and appropriate Neurologic:  mild tremor, mildly decreased sensation on the hands, no pronator drift. Decreased sensory and motor strength 2/5 on lower extremities. Patellar DTRs are 2/ 2           Labs on Admission:  Basic Metabolic Panel:  Recent Labs Lab 05/23/15 1533 05/25/15 1300  NA 134* 134*  K 4.2 4.5  CL 100* 102  CO2 26 24  GLUCOSE 82 97  BUN 20 16  CREATININE 0.78 0.67  CALCIUM 9.0 9.4   Liver Function Tests:  Recent Labs Lab 05/25/15 1300  AST 18  ALT 18  ALKPHOS 73  BILITOT 0.5  PROT 6.8  ALBUMIN 3.2*   CBC:  Recent Labs Lab 05/23/15 1533 05/25/15 1300  WBC 4.8 6.6  NEUTROABS 2.2 3.2  HGB 10.5* 11.7*  HCT 31.2* 36.7*  MCV 86.9 87.6  PLT 225 200   Cardiac Enzymes:  Radiological Exams on Admission: Mr Lumbar Spine W Wo Contrast  05/25/2015  CLINICAL DATA:  Lung cancer patient with development of low  back pain with leg weakness and tingling in the feet. EXAM: MRI LUMBAR SPINE WITHOUT AND WITH CONTRAST TECHNIQUE: Multiplanar and multiecho pulse sequences of the lumbar spine were obtained without and with intravenous contrast. CONTRAST:  3m MULTIHANCE GADOBENATE DIMEGLUMINE 529 MG/ML IV SOLN COMPARISON:  CT abdomen 05/23/2015.  PET scan 03/28/2015. FINDINGS: There is evidence of osseous metastatic disease in the region within the right transverse process of L1. This does not have any effect upon the neural structures. There is a metastasis within the S1 vertebral body without extraosseous extension. There is discogenic edema and enhancement at the endplates at TK44-W1 LU2-7 L2-3 and especially at the L5-S1 level. This could certainly be associated with back pain. Right paracentral disc herniation at T12-L1 indents the thecal sac and could cause neural compression in the right lateral recess. Shallow broad-based disc herniation at L1-2 indents the thecal sac and causes narrowing of both lateral recesses without definite neural  compression. Disc bulge at L2-3 is associated with mild bilateral lateral recess narrowing. Disc bulge at L3-4 in combination with facet and ligamentous hypertrophy is associated with bilateral lateral recess narrowing without definite neural compression. Disc bulge at L4-5 and mild facet hypertrophy results in mild lateral recess narrowing but no definite neural compression. Shallow disc protrusion at L5-S1 does not cause any central canal stenosis. Mild stenosis of both subarticular lateral recesses and foramina. The exiting L5 nerve roots would be at risk of compression. IMPRESSION: Metastatic disease to the right transverse process of L1, without any effect upon the neural structures. Metastatic disease to the S1 vertebral body, without any extraosseous extension. 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 Right posterior lateral disc herniation at T12-L1 with right lateral recess narrowing that could cause neural compression. Lateral recess narrowing at L1-2, L2-3, L3-4, L4-5 and L5-S1, without severe disease or definite neural compression. Foraminal stenosis bilaterally at L5-S1 could possibly affect the exiting L5 nerve roots on either or both sides. Electronically Signed   By: MNelson ChimesM.D.   On: 05/25/2015 17:27    EKG: Independently reviewed. Vent. 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 Per patient and wife, IR oxycodone 15 mg does not take care of the pain well enough and does not last long. I will start the patient on fentanyl patch 25 g every 72 hours and  use Dilaudid IV for breakthrough pain while the patient is in the hospital. Consult neurosurgery in a.m. Consult PT/OT, case management and social services in the morning.   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. His next appointment should be next week.  consider palliative care evaluation.     Anemia  monitor hematocrit and hemoglobin.     COPD (chronic obstructive pulmonary disease) (HCC) supplemental oxygen. DuoNeb every 6 hours.     Neurosurgery on-call was paged, awaiting response.   Code Status: Full code. DVT Prophylaxis: Lovenox SQ. Family Communication: His wife LMamadou Breonwas by the bedside. Disposition Plan: Admit for  pain management, neurosurgery consult, PT/OT evaluation.   Time spent: Over 70 minutes were used during the process of his admission.   Reubin Milan [facility] Pager [phone]

## 2015-05-25 NOTE — ED Notes (Signed)
MRI - Notified possible at 4:30 pm.

## 2015-05-25 NOTE — ED Notes (Signed)
Pt reports now his arm is going numb.

## 2015-05-25 NOTE — ED Notes (Addendum)
Dr Ortiz at bedside 

## 2015-05-25 NOTE — ED Provider Notes (Signed)
Care assumed at shift change from [facility]. 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 Oncology was consulted and they recommended MRI L-spine and if any significant mets show up to admit to hospitalist or re-consult onc to see about admission/further treatment. If negative then pt's wife states she can care for him therefore if they're comfortable going home they could go home to f/up outpt, vs admit since he can't walk for possible PT/OT eval? Will await MRI.  Physical Exam  BP 128/86 mmHg  Pulse 65  Temp(Src) 97.9 F (36.6 C) (Oral)  Resp 26  SpO2 100%  Physical Exam Gen: afebrile, VSS, NAD HEENT: EOMI, MMM Resp: no resp distress CV: rate WNL Abd: appearance normal, nondistended MsK: sensation grossly intact in all extremities. Plantarflexion strength 4/5, but dorsiflexion strength limited ~3/5. Neuro: A&O x4   ED Course  Procedures Results for orders placed or performed during the hospital encounter of 05/25/15  CBC with Differential  Result Value Ref Range   WBC 6.6 4.0 - 10.5 K/uL   RBC 4.19 (L) 4.22 - 5.81 MIL/uL   Hemoglobin 11.7 (L) 13.0 - 17.0 g/dL   HCT 36.7 (L) 39.0 - 52.0 %   MCV 87.6 78.0 - 100.0 fL   MCH 27.9 26.0 - 34.0 pg   MCHC 31.9 30.0 - 36.0 g/dL   RDW 14.4 11.5 - 15.5 %   Platelets 200 150 - 400 K/uL   Neutrophils Relative % 47 %   Neutro Abs 3.2 1.7 - 7.7 K/uL   Lymphocytes Relative 39 %   Lymphs Abs 2.6 0.7 - 4.0 K/uL   Monocytes Relative 9 %   Monocytes Absolute 0.6 0.1 - 1.0 K/uL   Eosinophils Relative 5 %   Eosinophils Absolute 0.3 0.0 - 0.7 K/uL   Basophils Relative 0 %   Basophils Absolute 0.0 0.0 - 0.1 K/uL  Comprehensive metabolic panel  Result Value Ref Range   Sodium 134 (L) 135 - 145 mmol/L   Potassium 4.5 3.5 - 5.1 mmol/L   Chloride 102 101 - 111 mmol/L   CO2 24 22 - 32 mmol/L   Glucose, Bld 97 65 - 99 mg/dL   BUN 16 6 -  20 mg/dL   Creatinine, Ser 0.67 0.61 - 1.24 mg/dL   Calcium 9.4 8.9 - 10.3 mg/dL   Total Protein 6.8 6.5 - 8.1 g/dL   Albumin 3.2 (L) 3.5 - 5.0 g/dL   AST 18 15 - 41 U/L   ALT 18 17 - 63 U/L   Alkaline Phosphatase 73 38 - 126 U/L   Total Bilirubin 0.5 0.3 - 1.2 mg/dL   GFR calc non Af Amer >60 >60 mL/min   GFR calc Af Amer >60 >60 mL/min   Anion gap 8 5 - 15  Urinalysis, Routine w reflex microscopic (not at [facility])  Result Value Ref Range   Color, Urine YELLOW YELLOW   APPearance CLEAR CLEAR   Specific Gravity, Urine 1.012 1.005 - 1.030   pH 6.5 5.0 - 8.0   Glucose, UA NEGATIVE NEGATIVE mg/dL   Hgb urine dipstick MODERATE (A) NEGATIVE   Bilirubin Urine NEGATIVE NEGATIVE   Ketones, ur NEGATIVE NEGATIVE mg/dL   Protein, ur NEGATIVE NEGATIVE mg/dL   Nitrite NEGATIVE NEGATIVE   Leukocytes, UA TRACE (A) NEGATIVE  Urine microscopic-add on  Result Value Ref Range   Squamous Epithelial / LPF NONE SEEN NONE  SEEN   WBC, UA 0-5 0 - 5 WBC/hpf   RBC / HPF 6-30 0 - 5 RBC/hpf   Bacteria, UA NONE SEEN NONE SEEN   Crystals CA OXALATE CRYSTALS (A) NEGATIVE   Dg Chest 2 View  05/08/2015  CLINICAL DATA:  PICC absent indigestion for 3 days. Chest pain. History of lung cancer. Former smoker. EXAM: CHEST  2 VIEW COMPARISON:  CT chest 03/16/2015.  Chest 03/07/2015 FINDINGS: Cavitary mass again demonstrated in the right cardiophrenic angle posteriorly. There is now fluid within the mass and an air-fluid level is demonstrated. Possible right paratracheal and right hilar lymphadenopathy. Normal heart size and pulmonary vascularity. Calcified and tortuous aorta. Diffuse interstitial pattern to the lungs is increased since previous study, possibly indicating interstitial edema, interstitial pneumonia, or interstitial tumor spread. Hyperinflation consistent with underlying emphysema. No pneumothorax. Old left rib fracture. IMPRESSION: Cavitary mass in the right lung base posteriorly now contains an air-fluid  level. Probable lymphadenopathy in the right paratracheal and right hilar regions. Increasing interstitial pattern to the lungs may indicate developing interstitial edema, interstitial pneumonia, or interstitial tumor spread. Electronically Signed   By: Lucienne Capers M.D.   On: 05/08/2015 23:12   Ct Angio Chest Pe W/cm &/or Wo Cm  05/09/2015  CLINICAL DATA:  Acute onset of generalized chest pain and shortness of breath. Current history of lung cancer. Initial encounter. EXAM: CT ANGIOGRAPHY CHEST WITH CONTRAST TECHNIQUE: Multidetector CT imaging of the chest was performed using the standard protocol during bolus administration of intravenous contrast. Multiplanar CT image reconstructions and MIPs were obtained to evaluate the vascular anatomy. CONTRAST:  169m OMNIPAQUE IOHEXOL 350 MG/ML SOLN COMPARISON:  CT of the chest performed 03/16/2015, and PET/CT performed 03/28/2015 FINDINGS: There is no evidence of pulmonary embolus. The large cavitary mass at the right lower lobe is similar in size to the prior study, measuring approximately 8.1 x 7.5 cm, but there is now a large amount of fluid within the mass, raising concern for underlying infection. There is increasing airspace opacification at the right lung base, concerning for postobstructive pneumonia. An increasing small right pleural effusion is noted, likely reflecting a malignant effusion. The left lung remains clear.  No pneumothorax is seen. Marked mediastinal lymphadenopathy is perhaps slightly improved, with a mass measuring 3.7 cm in short axis at the azygoesophageal region, and another mass measuring 3.1 cm in short axis at the right paratracheal region. Right hilar lymphadenopathy is also noted. No significant pericardial effusion is identified. Scattered calcification is noted along the thoracic aorta and proximal great vessels. No axillary lymphadenopathy is seen. The visualized portions of the thyroid gland are unremarkable in appearance. A 2.2  cm metastasis is again noted within the spleen. Visualized portions of the liver are grossly unremarkable. Bilateral large adrenal masses are again noted, measuring approximately 5.2 cm on the right and 3.6 cm on the left. No acute osseous abnormalities are seen. Review of the MIP images confirms the above findings. IMPRESSION: 1. No evidence of pulmonary embolus. 2. Large cavitary mass at the right lower lobe is similar in size, measuring 8.1 x 7.5 cm, but there is now a large amount of fluid within the mass, concerning for underlying infection. Increasing airspace opacification at the right lung base, concerning for postobstructive pneumonia. 3. Increasing small right pleural effusion, likely reflecting a malignant effusion. 4. Marked mediastinal lymphadenopathy is perhaps slightly improved, with masses measuring up to 3.7 cm in short axis. Right hilar lymphadenopathy also seen. 5. Adrenal and splenic metastases  again noted. Electronically Signed   By: Garald Balding M.D.   On: 05/09/2015 01:47   Mr Lumbar Spine W Wo Contrast  05/25/2015  CLINICAL DATA:  Lung cancer patient with development of low back pain with leg weakness and tingling in the feet. EXAM: MRI LUMBAR SPINE WITHOUT AND WITH CONTRAST TECHNIQUE: Multiplanar and multiecho pulse sequences of the lumbar spine were obtained without and with intravenous contrast. CONTRAST:  64m MULTIHANCE GADOBENATE DIMEGLUMINE 529 MG/ML IV SOLN COMPARISON:  CT abdomen 05/23/2015.  PET scan 03/28/2015. FINDINGS: There is evidence of osseous metastatic disease in the region within the right transverse process of L1. This does not have any effect upon the neural structures. There is a metastasis within the S1 vertebral body without extraosseous extension. There is discogenic edema and enhancement at the endplates at TR67-E9 LF8-1 L2-3 and especially at the L5-S1 level. This could certainly be associated with back pain. Right paracentral disc herniation at T12-L1  indents the thecal sac and could cause neural compression in the right lateral recess. Shallow broad-based disc herniation at L1-2 indents the thecal sac and causes narrowing of both lateral recesses without definite neural compression. Disc bulge at L2-3 is associated with mild bilateral lateral recess narrowing. Disc bulge at L3-4 in combination with facet and ligamentous hypertrophy is associated with bilateral lateral recess narrowing without definite neural compression. Disc bulge at L4-5 and mild facet hypertrophy results in mild lateral recess narrowing but no definite neural compression. Shallow disc protrusion at L5-S1 does not cause any central canal stenosis. Mild stenosis of both subarticular lateral recesses and foramina. The exiting L5 nerve roots would be at risk of compression. IMPRESSION: Metastatic disease to the right transverse process of L1, without any effect upon the neural structures. Metastatic disease to the S1 vertebral body, without any extraosseous extension. 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 Right posterior lateral disc herniation at T12-L1 with right lateral recess narrowing that could cause neural compression. Lateral recess narrowing at L1-2, L2-3, L3-4, L4-5 and L5-S1, without severe disease or definite neural compression. Foraminal stenosis bilaterally at L5-S1 could possibly affect the exiting L5 nerve roots on either or both sides. Electronically Signed   By: MNelson ChimesM.D.   On: 05/25/2015 17:27   Ct Abdomen Pelvis W Contrast  05/23/2015  CLINICAL DATA:  Suprapubic pain, urinary retention x5 days. Newly diagnosed lung cancer with metastases to adrenal glands and lymph nodes. EXAM: CT ABDOMEN AND PELVIS WITH CONTRAST TECHNIQUE: Multidetector CT imaging of the abdomen and pelvis was performed using the standard protocol following bolus administration of intravenous contrast.  CONTRAST:  104mOMNIPAQUE IOHEXOL 300 MG/ML  SOLN COMPARISON:  PET-CT dated 03/28/2015.  CTA chest dated 05/09/2015. FINDINGS: Lower chest: Thick-walled cavitary mass with air-fluid level in the right lower lobe, measuring 5.3 x 4.4 cm, previously 8.1 x 7.5 cm on recent CT chest. 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 Small right pleural effusion. Hepatobiliary: Liver is within normal limits. Gallbladder is unremarkable. No intrahepatic or extrahepatic ductal dilatation. Pancreas: Within normal limits. Spleen: Two lesions in the anterior spleen measuring up to 1.5 cm, previously 2.2 cm, at least one of which was hypermetabolic and likely reflects a metastasis. Adrenals/Urinary Tract: Bilateral adrenal metastases, measuring 4.9 x 3.1 cm on the right (previously 5.2 cm) and  2.8 x 1.7 cm on the left (previously 3.6 cm). Kidneys are within normal limits.  No hydronephrosis. Bladder is moderately distended. Stomach/Bowel: Stomach is within normal limits. No evidence of bowel obstruction. Appendix is not discretely visualized. Vascular/Lymphatic: Atherosclerotic calcifications of the abdominal aorta and branch vessels. 12 mm short axis gastrohepatic node (series 2/ image 15), previously 19 mm. 9 mm short axis portacaval node (series 2/image 21) and 7 mm short axis left para-aortic node (series 2/ image 29), within the upper limits of normal. Reproductive: Mild prostatomegaly. Other: No abdominopelvic ascites. Musculoskeletal: Degenerative changes of the visualized thoracolumbar spine, most prominent at L5-S1. IMPRESSION: Bladder is moderately distended.  Mild prostatomegaly. 5.3 x 4.4 cm thick-walled cavitary mass in the right lower lobe, corresponding to known primary bronchogenic neoplasm, decreased. Associated 3.0 cm satellite nodule in the right lower lobe with probable lymphangitic  carcinomatosis. Small right pleural effusion. Bilateral adrenal and splenic metastases, improved. Gastrohepatic nodal metastasis, improved. Electronically Signed   By: Julian Hy M.D.   On: 05/23/2015 17:12   Dg Swallowing Func-speech Pathology  05/09/2015  Ephraim Hamburger, CCC-SLP     05/09/2015  5:43 PM Objective Swallowing Evaluation: MBS-Modified Barium Swallow Study Patient Details Name: Barry Cook MRN: [id number] Date of Birth: 1956-02-05 Today's Date: 05/09/2015 Time: SLP Start Time (ACUTE ONLY): 1414-SLP Stop Time (ACUTE ONLY): 1430 SLP Time Calculation (min) (ACUTE ONLY): 16 min Past Medical History: Past Medical History Diagnosis Date . Cancer ([place])    Lung Past Surgical History: History reviewed. No pertinent past surgical history. HPI: Barry Cook is an 59 y.o. 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 He has no fever, chills, or coughs. He denied SOB, but admitted to having trouble with swallowing. Evlaution in the ER included a Hb of 8.8 g per dL, a drop from prior normal Hb. He has no black stool or bloody stool, and has no evidence of bleeding. EKG showed no acute ST T changes, and troponin was normal. He had a CTA which was negative for PE, confirming the present of his large lung mass, and also suggestive of PNA. He was given IV Zosyn in the ER, and hospitalist was asked to admit him for chest pain, anemia, nausea, vomting, and hiccups. He normally sees Dr Curt Bears at [facility] Subjective: "Doing alright" Assessment / Plan / Recommendation CHL IP CLINICAL IMPRESSIONS 05/09/2015 Therapy Diagnosis WFL Clinical Impression MBSS completed this date. Swallow function essentially WFL. No penetration or aspiration observed. Pt with mild vallecular  residue, but clears with repeat/dry swallow. No stasis noted in pharynx. Barium tablet became transiently delayed in distal esophagus near GE junction, but passed after sips of water and bite of applesauce. Pt with some belching after completion of test. SLP reinforced need for pt to continue with oral diet to help combat the effects of radiation therapy long term. Given normal swallow function in today's study, prandial aspiration is doubtful. Pt does endorse heartburn and some heaving due to hiccups so it is possible that pt aspirated post prandial and/or at night. Pt reports elevating HOB at night which he will continue. Recommend continuing soft diet as ordered as pt reports some difficulty with meats/breads; ok for thin liquids. SLP will follow up X1 for diet tolerance and ongoing education as needed. Pt in agreement  with plan of care.  Impact on safety and function No limitations   CHL IP TREATMENT RECOMMENDATION 05/09/2015 Treatment Recommendations Therapy as outlined in treatment plan below   Prognosis 05/09/2015 Prognosis for Safe Diet Advancement Good Barriers to Reach Goals -- Barriers/Prognosis Comment -- CHL IP DIET RECOMMENDATION 05/09/2015 SLP Diet Recommendations Regular solids;Dysphagia 3 (Mech soft) solids;Thin liquid Liquid Administration via Cup;Straw Medication Administration Whole meds with liquid Compensations Multiple dry swallows after each bite/sip Postural Changes Remain semi-upright after after feeds/meals (Comment);Seated upright at 90 degrees   CHL IP OTHER RECOMMENDATIONS 05/09/2015 Recommended Consults -- Oral Care Recommendations Oral care BID Other Recommendations Clarify dietary restrictions   CHL IP FOLLOW UP RECOMMENDATIONS 05/09/2015 Follow up Recommendations None   CHL IP FREQUENCY AND DURATION 05/09/2015 Speech Therapy Frequency (ACUTE ONLY) min 1 x/week Treatment Duration --      CHL IP ORAL PHASE 05/09/2015 Oral Phase WFL Oral - Pudding Teaspoon -- Oral - Pudding Cup -- Oral  - Honey Teaspoon -- Oral - Honey Cup -- Oral - Nectar Teaspoon -- Oral - Nectar Cup -- Oral - Nectar Straw -- Oral - Thin Teaspoon -- Oral - Thin Cup -- Oral - Thin Straw -- Oral - Puree -- Oral - Mech Soft -- Oral - Regular -- Oral - Multi-Consistency -- Oral - Pill -- Oral Phase - Comment --  CHL IP PHARYNGEAL PHASE 05/09/2015 Pharyngeal Phase WFL;Impaired Pharyngeal- Pudding Teaspoon -- Pharyngeal -- Pharyngeal- Pudding Cup -- Pharyngeal -- Pharyngeal- Honey Teaspoon -- Pharyngeal -- Pharyngeal- Honey Cup -- Pharyngeal -- Pharyngeal- Nectar Teaspoon -- Pharyngeal -- Pharyngeal- Nectar Cup -- Pharyngeal -- Pharyngeal- Nectar Straw -- Pharyngeal -- Pharyngeal- Thin Teaspoon -- Pharyngeal -- Pharyngeal- Thin Cup Delayed swallow initiation-vallecula Pharyngeal -- Pharyngeal- Thin Straw Delayed swallow initiation-vallecula;Pharyngeal residue - valleculae Pharyngeal -- Pharyngeal- Puree Delayed swallow initiation-vallecula Pharyngeal -- Pharyngeal- Mechanical Soft -- Pharyngeal -- Pharyngeal- Regular WFL Pharyngeal -- Pharyngeal- Multi-consistency -- Pharyngeal -- Pharyngeal- Pill WFL Pharyngeal -- Pharyngeal Comment --  CHL IP CERVICAL ESOPHAGEAL PHASE 05/09/2015 Cervical Esophageal Phase WFL Pudding Teaspoon -- Pudding Cup -- Honey Teaspoon -- Honey Cup -- Nectar Teaspoon -- Nectar Cup -- Nectar Straw -- Thin Teaspoon -- Thin Cup -- Thin Straw -- Puree -- Mechanical Soft -- Regular -- Multi-consistency -- Pill -- Cervical Esophageal Comment -- Thank you, Genene Churn, [place] 05/09/2015, 5:42 PM                Meds ordered this encounter  Medications  . tamsulosin (FLOMAX) 0.4 MG CAPS capsule    Sig: Take 0.4 mg by mouth daily after supper.  Marland Kitchen LORazepam (ATIVAN) injection 2 mg    Sig:   . gadobenate dimeglumine (MULTIHANCE) injection 15 mL    Sig:      MDM:   ICD-9-CM ICD-10-CM   1. Weakness 780.79 R53.1 MR Lumbar Spine W Wo Contrast     MR Lumbar Spine W Wo Contrast  2.  Metastatic cancer (HCC) 199.1 C80.1    5:44 PM MRI Lumbar spine revealing mets to L1 and S1 without extraosseous extension or neural involvement at these levels. Degenerative disc disease in multiple levels. R posterior lateral disc herniation at T12/L1 with R lateral recess narrowing that could cause neural compression, and lateral recess narrowing at multiple L-spine levels without neural compression. Given these findings, will discuss with oncology to evaluate next step. Pt declines any needs at this time. U/A pending.   6:25 PM Dr. Lindi Adie returning page for oncology, states that it  doesn't appear that metastatic disease is causing the weakness specifically since no cord compression is found, but he feels that pt would benefit from medicine admission to possibly discuss with neurosurgeons vs get PT/OT on board. Will call triad for admission. Of note, U/A without evidence of UTI  6:58 PM Dr. Aileen Fass returning page for triad, will admit to med-surg. Holding orders placed. Please see his notes for further documentation of care.  [address] [place], PA-C 05/25/15 1859  Leonard Schwartz, MD 05/28/15 [phone]

## 2015-05-25 NOTE — Telephone Encounter (Signed)
Returned call to [place] regarding leg numbness inability to walk. After review with MD, instructed [name] to take Pt to ED immediately as this could be cord compression. [name] advised she thought so too and will bring him to [facility] ED this am asap. No further concerns.

## 2015-05-25 NOTE — Progress Notes (Signed)
Patient listed as not having insurance or a pcp.  EDCM spoke to patient and his wife at bedside.  Patient lives with his wife in [place].  Patient was admitted to [facility] 12/12 to 12/16 for n/v decreased appetite, and pleuritic chest pain.  Patient with pmhx of lung cancer, mets to spine.  Patient usually able to ambulate and perform ADL's on his own.  Patient reports the last time he walker was on Monday.  Patient's wife reports, "This was the first time in 35 years I had to help him get dressed.  Patient wears oxygen at night supplied by [facility].  Patient's wife reports, "The oxygen is about to stop because the cancer center is only paying for the first month."  Patient reports her sister in law purchased an oxygen concentrator for her mother, but the mother passed away.  Patient's wife reports she will ask her sister in law if she could have the oxygen concentrator.  Patient also does not have any equipment at home.  Patient's wife reports patient is Medicaid pending.  Patient's wife is [name] [phone] or [phone].  Please call with any questions, changes in patient condition and treatment.  Patient's wife also inquiring about hospice.  Will leave sticky note for MD to order palliative care consult if appropriate.  No further EDCM needs at this time.

## 2015-05-25 NOTE — ED Notes (Signed)
Bed: ML54 Expected date:  Expected time:  Means of arrival:  Comments: Ems

## 2015-05-25 NOTE — ED Notes (Signed)
Pt with Hx of lung cancer states he is unable to lift his legs onset Monday, tingling in feet. Pt able to move legs feebly but unable to lift them more than 1-2 cm from floor. Sensation intact. Pt reports tingling and weakness in hands for several months. Last chemo treatment 05/15/1015. No recent radiation.

## 2015-05-25 NOTE — ED Notes (Signed)
Bed: WA12 Expected date:  Expected time:  Means of arrival:  Comments: ems 

## 2015-05-25 NOTE — ED Notes (Signed)
Changed urinary leg bag to get clean urine sample

## 2015-05-25 NOTE — ED Notes (Signed)
rn alerted PA that pt is complaining of arm numbness now

## 2015-05-26 ENCOUNTER — Observation Stay (HOSPITAL_COMMUNITY): Payer: Medicaid Other

## 2015-05-26 DIAGNOSIS — M549 Dorsalgia, unspecified: Secondary | ICD-10-CM | POA: Diagnosis not present

## 2015-05-26 DIAGNOSIS — C799 Secondary malignant neoplasm of unspecified site: Secondary | ICD-10-CM | POA: Insufficient documentation

## 2015-05-26 DIAGNOSIS — J438 Other emphysema: Secondary | ICD-10-CM | POA: Diagnosis not present

## 2015-05-26 DIAGNOSIS — C3430 Malignant neoplasm of lower lobe, unspecified bronchus or lung: Secondary | ICD-10-CM

## 2015-05-26 DIAGNOSIS — R531 Weakness: Secondary | ICD-10-CM

## 2015-05-26 DIAGNOSIS — C7970 Secondary malignant neoplasm of unspecified adrenal gland: Secondary | ICD-10-CM

## 2015-05-26 DIAGNOSIS — R339 Retention of urine, unspecified: Secondary | ICD-10-CM

## 2015-05-26 DIAGNOSIS — C801 Malignant (primary) neoplasm, unspecified: Secondary | ICD-10-CM

## 2015-05-26 DIAGNOSIS — C3431 Malignant neoplasm of lower lobe, right bronchus or lung: Secondary | ICD-10-CM | POA: Diagnosis not present

## 2015-05-26 DIAGNOSIS — C7951 Secondary malignant neoplasm of bone: Principal | ICD-10-CM

## 2015-05-26 LAB — COMPREHENSIVE METABOLIC PANEL
ALK PHOS: 66 U/L (ref 38–126)
ALT: 16 U/L — AB (ref 17–63)
ANION GAP: 7 (ref 5–15)
AST: 13 U/L — ABNORMAL LOW (ref 15–41)
Albumin: 2.8 g/dL — ABNORMAL LOW (ref 3.5–5.0)
BUN: 14 mg/dL (ref 6–20)
CALCIUM: 8.8 mg/dL — AB (ref 8.9–10.3)
CO2: 26 mmol/L (ref 22–32)
CREATININE: 0.76 mg/dL (ref 0.61–1.24)
Chloride: 105 mmol/L (ref 101–111)
Glucose, Bld: 102 mg/dL — ABNORMAL HIGH (ref 65–99)
Potassium: 4.3 mmol/L (ref 3.5–5.1)
SODIUM: 138 mmol/L (ref 135–145)
Total Bilirubin: 0.6 mg/dL (ref 0.3–1.2)
Total Protein: 6 g/dL — ABNORMAL LOW (ref 6.5–8.1)

## 2015-05-26 LAB — CBC WITH DIFFERENTIAL/PLATELET
Basophils Absolute: 0 10*3/uL (ref 0.0–0.1)
Basophils Relative: 0 %
EOS ABS: 0.2 10*3/uL (ref 0.0–0.7)
EOS PCT: 4 %
HCT: 33.2 % — ABNORMAL LOW (ref 39.0–52.0)
HEMOGLOBIN: 10.7 g/dL — AB (ref 13.0–17.0)
LYMPHS ABS: 2 10*3/uL (ref 0.7–4.0)
LYMPHS PCT: 40 %
MCH: 28.5 pg (ref 26.0–34.0)
MCHC: 32.2 g/dL (ref 30.0–36.0)
MCV: 88.5 fL (ref 78.0–100.0)
MONOS PCT: 7 %
Monocytes Absolute: 0.4 10*3/uL (ref 0.1–1.0)
Neutro Abs: 2.5 10*3/uL (ref 1.7–7.7)
Neutrophils Relative %: 49 %
PLATELETS: 196 10*3/uL (ref 150–400)
RBC: 3.75 MIL/uL — ABNORMAL LOW (ref 4.22–5.81)
RDW: 14.6 % (ref 11.5–15.5)
WBC: 5 10*3/uL (ref 4.0–10.5)

## 2015-05-26 MED ORDER — MORPHINE SULFATE ER 15 MG PO TBCR
15.0000 mg | EXTENDED_RELEASE_TABLET | Freq: Two times a day (BID) | ORAL | Status: DC
  Administered 2015-05-26 – 2015-05-28 (×4): 15 mg via ORAL
  Filled 2015-05-26 (×4): qty 1

## 2015-05-26 MED ORDER — FAMOTIDINE 20 MG PO TABS
20.0000 mg | ORAL_TABLET | Freq: Two times a day (BID) | ORAL | Status: DC
  Administered 2015-05-26 – 2015-05-30 (×8): 20 mg via ORAL
  Filled 2015-05-26 (×9): qty 1

## 2015-05-26 MED ORDER — TRAZODONE HCL 50 MG PO TABS: 25.0000 mg | ORAL_TABLET | Freq: Every evening | ORAL | Status: DC | PRN

## 2015-05-26 MED ORDER — LORAZEPAM 2 MG/ML IJ SOLN
2.0000 mg | INTRAMUSCULAR | Status: AC
  Administered 2015-05-26: 2 mg via INTRAVENOUS
  Filled 2015-05-26: qty 1

## 2015-05-26 MED ORDER — DEXAMETHASONE SODIUM PHOSPHATE 4 MG/ML IJ SOLN
4.0000 mg | Freq: Four times a day (QID) | INTRAMUSCULAR | Status: DC
  Administered 2015-05-26 – 2015-05-27 (×4): 4 mg via INTRAVENOUS
  Filled 2015-05-26 (×4): qty 1

## 2015-05-26 MED ORDER — IPRATROPIUM-ALBUTEROL 0.5-2.5 (3) MG/3ML IN SOLN
3.0000 mL | Freq: Three times a day (TID) | RESPIRATORY_TRACT | Status: DC
Start: 2015-05-26 — End: 2015-05-27
  Administered 2015-05-26 – 2015-05-27 (×2): 3 mL via RESPIRATORY_TRACT
  Filled 2015-05-26 (×2): qty 3

## 2015-05-26 MED ORDER — SENNOSIDES-DOCUSATE SODIUM 8.6-50 MG PO TABS
1.0000 | ORAL_TABLET | Freq: Two times a day (BID) | ORAL | Status: DC
  Administered 2015-05-26 – 2015-05-30 (×8): 1 via ORAL
  Filled 2015-05-26 (×9): qty 1

## 2015-05-26 NOTE — Progress Notes (Signed)
CSW received referral that pt uninsured.  CSW reviewed chart and pt and pt wife were seen by ED RNCM yesterday evening. Per RNCM note, Patient's wife reports patient is Medicaid pending.  No social work needs identified as pt has already applied for Medicaid and Medicaid is currently pending.   Please re-consult if social work needs arise.  Alison Murray, MSW, [facility] [phone]

## 2015-05-26 NOTE — Progress Notes (Addendum)
**Note Barry-Identified via Obfuscation** PROGRESS NOTE  DEVANSH Cook XLK:[id number] DOB: 1956-03-07 DOA: 05/25/2015 PCP: No PCP Per Patient  HPI/Recap of past 24 hours:  not able to lift bilateral lower leg against gravity, able lift arms above head, does c/o numbness in all extremities.  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   cancer pain: adjust pain meds  Urinary retention: on flomax, foley in until see urology in a week  Constipation: stool softner  Metastatic lung ca on immunotherapy, oncology consulted   Copd: does wheezing, schedule nebs, on steroids. Consider abx if not improving.  Addendum: radiation oncology Dr. Tammi Klippel would like patient to be transferred to [facility] tomorrow morning for MRI under anesthesia. Patient was not able to lay flat for tonight's mri.  Code Status: full  Family Communication: patient and multiple family members in room  Disposition Plan: remain in the hospital   Consultants:  Oncology  Radiation oncology  neurosurgery  Procedures:  none  Antibiotics:  none   Objective: BP 114/87 mmHg  Pulse 84  Temp(Src) 98.4 F (36.9 C) (Oral)  Resp 20  Ht 6' (1.829 m)  Wt 139 lb 1.8 oz (63.1 kg)  BMI 18.86 kg/m2  SpO2 97%  Intake/Output Summary (Last 24 hours) at 05/26/15 1750 Last data filed at 05/26/15 0944  Gross per 24 hour  Intake    240 ml  Output    600 ml  Net   -360 ml   Filed Weights   05/25/15 2115  Weight: 139 lb 1.8 oz (63.1 kg)     Exam:   General:  NAD, frail, thin  Cardiovascular: RRR  Respiratory: mild wheezing bilaterally  Abdomen: Soft/ND/NT, positive BS  Musculoskeletal: No Edema  Neuro: not able to lift legs against gravity, sensation intact. aaox3  Data Reviewed: Basic Metabolic Panel:  Recent Labs Lab 05/23/15 1533 05/25/15 1300 05/26/15 0435  NA 134* 134* 138  K 4.2 4.5 4.3  CL 100* 102 105  CO2 '26 24 26  '$ GLUCOSE 82 97 102*  BUN '20 16 14  '$ CREATININE 0.78 0.67 0.76  CALCIUM 9.0 9.4 8.8*   Liver Function Tests:  Recent Labs Lab 05/25/15 1300 05/26/15 0435  AST 18 13*  ALT 18 16*  ALKPHOS 73 66  BILITOT 0.5 0.6  PROT 6.8 6.0*  ALBUMIN 3.2* 2.8*   No results for input(s): LIPASE, AMYLASE in the last 168 hours. No results for input(s): AMMONIA in the last 168 hours. CBC:  Recent Labs Lab 05/23/15 1533 05/25/15 1300 05/26/15 0435  WBC 4.8 6.6 5.0  NEUTROABS 2.2 3.2 2.5  HGB 10.5* 11.7* 10.7*  HCT 31.2* 36.7* 33.2*  MCV 86.9 87.6 88.5  PLT 225 200 196   Cardiac Enzymes:   No results for input(s): CKTOTAL, CKMB, CKMBINDEX, TROPONINI in the last 168 hours. BNP (last 3 results) No results for input(s): BNP in the last 8760 hours.  ProBNP (last 3 results) No results for input(s): PROBNP in the last 8760 hours.  CBG: No results for input(s): GLUCAP in the last 168 hours.  Recent Results (from the past 240 hour(s))  Urine culture     Status: None   Collection Time: 05/23/15  2:10 PM  Result Value Ref Range Status   Specimen Description URINE, CLEAN CATCH  Final   Special Requests NONE  Final   Culture   Final    NO GROWTH 1 DAY Performed at [facility]    Report Status 05/24/2015 FINAL  Final     Studies: No results found.  Scheduled Meds: . enoxaparin (LOVENOX) injection  40 mg Subcutaneous QHS  . famotidine  20 mg Oral BID  . fentaNYL  25 mcg Transdermal Q72H  . ipratropium-albuterol  3 mL Nebulization 3 times per day  . LORazepam  2 mg  Intravenous On Call  . megestrol  400 mg Oral Daily  . morphine  15 mg Oral Q12H  . multivitamin with minerals  1 tablet Oral Daily  . polyethylene glycol  17 g Oral BID  . senna-docusate  1 tablet Oral BID  . tamsulosin  0.4 mg Oral QPC supper    Continuous Infusions: . sodium chloride 100 mL/hr at 05/25/15 2240     Time spent: 28mns  Pansie Guggisberg MD, PhD  [facility] Pager [phone] If 7PM-7AM, please contact night-coverage at [email or web address], password [email or web address]12/30/2016, 5:50 PM

## 2015-05-26 NOTE — Progress Notes (Signed)
  Radiation Oncology         [phone] ________________________________  Name: CHAUNCY MANGIARACINA MRN: [id number]  Date: 05/25/2015  DOB: 1956/02/20  Chart Note:  I reviewed this patient's most recent findings and wanted to take a minute to document my impression.  He was admitted with progressive inability to ambulate and history of spinal metastases from stage IV lung cancer.  A lumbar MRI is done, without showing cause of paralysis.  Thoracic spine MRI is pending.  Previous PET and CT show T-spine mets including one lesion at approximately T9 in the posterior vertebral body adjacent to the spinal canal.    Will await T-spine MRI result.  Adjusted from routine to STAT given the emergency nature of potentially finding and treating spinal cord compression.  Recommend empiric dexamethasone 4 mg QID.  Alphanumeric Pager [phone]  Mobile Text Messages [phone]     ------------------------------------------------   Tyler Pita, MD [place] Director and Director of Stereotactic Radiosurgery Direct Dial: [phone]  Fax: [phone] [email or web address]  Skype  [email or web address]

## 2015-05-26 NOTE — Progress Notes (Signed)
DIAGNOSIS: Stage IV (T3, N3,M1b) non-small cell lung cancer, poorly differentiated carcinoma, PDL 1 expression 60% diagnosed in November 2016. It presented with large cavitary right lower lobe lung mass in addition to ipsilateral and contralateral mediastinal lymphadenopathy as well as right axillary, right cervical and upper retroperitoneal nodal metastases in addition to metastatic bone disease and bilateral adrenal metastasis.  PRIOR THERAPY: Palliative radiotherapy to the metastatic bone lesion in the cervical spine.  CURRENT THERAPY: Keytruda (pembrolizumab) 200 mg IV every 3 weeks. First dose 04/24/2015. Status post one cycle.  Subjective: The patient is seen and examined today. His wife was at the bedside. He was admitted last night complaining of low back pain as well as lower extremity weakness especially with dorsiflexion. He also has a history of urinary tension and had a Foley's catheter placed by urology last week. He tolerated the first cycle of his treatment well with no significant adverse effects. He denied having any significant fever or chills, no nausea or vomiting. He has constipation for several days. The patient was treated with palliative radiotherapy to metastatic bone lesion from the cervical spine. MRI of the lumbar spine performed yesterday showed metastatic disease to the right transverse process of L1 without any effect upon the neural structures. There was also metastatic disease to the S1 vertebral body without any extraosseous extension in addition to degenerative disease with discogenic edema of the marrow at T12-L1, L1-2, 2-3 and L5-S1 that could be causing his back pain. There was also lateral recess narrowing at L1-2, L2-3, L3-4, L4-5 and L5/S1 without severe disease or definite neural compression. There was foraminal stenosis bilaterally at L5/S1 that could possibly affect the existing L5 nerve roots on either or both sides.   Objective: Vital signs in last 24  hours: Temp:  [97.8 F (36.6 C)-98.7 F (37.1 C)] 98.5 F (36.9 C) (12/30 0651) Pulse Rate:  [60-99] 91 (12/30 0651) Resp:  [15-26] 20 (12/30 0651) BP: (87-128)/(59-86) 102/70 mmHg (12/30 0651) SpO2:  [95 %-100 %] 95 % (12/30 0651) Weight:  [139 lb 1.8 oz (63.1 kg)-147 lb (66.679 kg)] 139 lb 1.8 oz (63.1 kg) (12/29 2115)  Intake/Output from previous day: 12/29 0701 - 12/30 0700 In: -  Out: 100 [Urine:100] Intake/Output this shift: Total I/O In: 240 [P.O.:240] Out: 500 [Urine:500]  General appearance: alert, cooperative, fatigued and no distress Resp: clear to auscultation bilaterally Cardio: regular rate and rhythm, S1, S2 normal, no murmur, click, rub or gallop GI: soft, non-tender; bowel sounds normal; no masses,  no organomegaly Extremities: extremities normal, atraumatic, no cyanosis or edema  Neuro exam: 4/5 muscle strength of the plantar flexor but 2 over 5 of the dorsiflexion of the feet.   Lab Results:   Recent Labs  05/25/15 1300 05/26/15 0435  WBC 6.6 5.0  HGB 11.7* 10.7*  HCT 36.7* 33.2*  PLT 200 196   BMET  Recent Labs  05/25/15 1300 05/26/15 0435  NA 134* 138  K 4.5 4.3  CL 102 105  CO2 24 26  GLUCOSE 97 102*  BUN 16 14  CREATININE 0.67 0.76  CALCIUM 9.4 8.8*    Studies/Results: Mr Lumbar Spine W Wo Contrast  05/25/2015  CLINICAL DATA:  Lung cancer patient with development of low back pain with leg weakness and tingling in the feet. EXAM: MRI LUMBAR SPINE WITHOUT AND WITH CONTRAST TECHNIQUE: Multiplanar and multiecho pulse sequences of the lumbar spine were obtained without and with intravenous contrast. CONTRAST:  29m MULTIHANCE GADOBENATE DIMEGLUMINE 529 MG/ML IV SOLN  COMPARISON:  CT abdomen 05/23/2015.  PET scan 03/28/2015. FINDINGS: There is evidence of osseous metastatic disease in the region within the right transverse process of L1. This does not have any effect upon the neural structures. There is a metastasis within the S1 vertebral body  without extraosseous extension. There is discogenic edema and enhancement at the endplates at K80-K3, K9-1, L2-3 and especially at the L5-S1 level. This could certainly be associated with back pain. Right paracentral disc herniation at T12-L1 indents the thecal sac and could cause neural compression in the right lateral recess. Shallow broad-based disc herniation at L1-2 indents the thecal sac and causes narrowing of both lateral recesses without definite neural compression. Disc bulge at L2-3 is associated with mild bilateral lateral recess narrowing. Disc bulge at L3-4 in combination with facet and ligamentous hypertrophy is associated with bilateral lateral recess narrowing without definite neural compression. Disc bulge at L4-5 and mild facet hypertrophy results in mild lateral recess narrowing but no definite neural compression. Shallow disc protrusion at L5-S1 does not cause any central canal stenosis. Mild stenosis of both subarticular lateral recesses and foramina. The exiting L5 nerve roots would be at risk of compression. IMPRESSION: Metastatic disease to the right transverse process of L1, without any effect upon the neural structures. Metastatic disease to the S1 vertebral body, without any extraosseous extension. 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 Right posterior lateral disc herniation at T12-L1 with right lateral recess narrowing that could cause neural compression. Lateral recess narrowing at L1-2, L2-3, L3-4, L4-5 and L5-S1, without severe disease or definite neural compression. Foraminal stenosis bilaterally at L5-S1 could possibly affect the exiting L5 nerve roots on either or both sides. Electronically Signed   By: Nelson Chimes M.D.   On: 05/25/2015 17:27    Medications: I have reviewed the patient's current medications.  Assessment/Plan: 1) metastatic non-small cell lung cancer with positive  PDL 1 expression currently on treatment with Ketruda (pembrolizumab) status post 1 cycle. The patient tolerated the first cycle of his treatment fairly well with no significant adverse effects. The patient will continue his treatment an outpatient basis. 2) weakness of the lower extremity as well as urinary retention: The patient is status post palliative radiotherapy to the cervical spine metastatic lesions. Has some degenerative disc disease in addition to foraminal stenosis in the lumbar spines. He may need repeat MRI of the cervical and thoracic spine to rule out any other lesion or cord compression. I would agree with temporary treatment with Decadron until rule out cord compression. If no evidence for cord compression Decadron need to be discontinued because of interaction with his current treatment with immunotherapy. Please consult radiation oncology and neurosurgery. Thank you so much for taking good care of Mr. curcumin, I will continue to follow up the patient with you and assist in his management on as-needed basis.      Birgit Nowling K. 05/26/2015

## 2015-05-26 NOTE — Evaluation (Signed)
Physical Therapy Evaluation Patient Details Name: Barry Cook MRN: [id number] DOB: Mar 06, 1956 Today's Date: 05/26/2015   History of Present Illness  59 yo male admitted with intractable back pain, MRI + mets L1, S1 and disc herniation T12, L1. Hx of lung cancer with cervical spinal mets, COPD  Clinical Impression  On eval, pt required Min assist (+2 for safety) for bed mobility and lateral scoot transfers towards [address]. Bilateral UE and LE weakness with R side appearing weaker than than the L. Pt c/o bil LE numbness as well however light touch sensation was intact. Pt participated well with session but he does fatigue fairly quickly. Recommend CIR consult. Wife not present during session. Pt states wife is available at home. May need to consider SNF placement if CIR is not an option. If pt/wife decide to return home, recommend HHPT if possible.      Follow Up Recommendations Supervision/Assistance - 24 hour;CIR     Equipment Recommendations  Wheelchair ;Wheelchair cushion ;Hospital bed; possibly sliding board   Recommendations for Other Services OT consult;Rehab consult     Precautions / Restrictions Precautions Precautions: Fall Restrictions Weight Bearing Restrictions: No      Mobility  Bed Mobility Overal bed mobility: Needs Assistance Bed Mobility: Supine to Sit;Sit to Supine     Supine to sit: Min assist;HOB elevated Sit to supine: Min assist;HOB elevated   General bed mobility comments: Increased time and effort. Assist for LEs mostly. Mild stabilization at trunk  Transfers Overall transfer level: Needs assistance   Transfers: Lateral/Scoot Transfers          Lateral/Scoot Transfers: Min assist (+2 safety) General transfer comment: Pt did not wish to transfer to recliner/WC so practiced lateral scooting along bed. Increased time and pt fatigues quickly/easily. Some trunk instability noted as well as pt began to fatigue  Ambulation/Gait              General Gait Details: NT-pt is unable at this time  Stairs            Wheelchair Mobility    Modified Rankin (Stroke Patients Only)       Balance Overall balance assessment: Needs assistance Sitting-balance support: Feet supported;Bilateral upper extremity supported Sitting balance-Leahy Scale: Good                                       Pertinent Vitals/Pain Pain Assessment: 0-10 Pain Score: 7  Pain Location: shoulders, back Pain Descriptors / Indicators: Sore    Home Living Family/patient expects to be discharged to:: Private residence Living Arrangements: Spouse/significant other   Type of Home: House Home Access: Stairs to enter Entrance Stairs-Rails: Right Entrance Stairs-Number of Steps: 3 Home Layout: One level Home Equipment: None      Prior Function Level of Independence: Independent Until this past Monday. Increased difficulty and inability to ambulate since then.               Hand Dominance        Extremity/Trunk Assessment   Upper Extremity Assessment: RUE deficits/detail;LUE deficits/detail RUE Deficits / Details: Strength ~3+/5. Fair grip     LUE Deficits / Details: Strength ~4/5. Good grip   Lower Extremity Assessment: RLE deficits/detail;LLE deficits/detail RLE Deficits / Details: Hip flex 2/5, hip abd/add 2/5, knee ext 2/5, DF, PF 2/5. Light touch intact but pt c/o numbness LLE Deficits / Details: hip flex 2/5, hip abd/add 2/5,  knee ext 3-/5, DF, PF 2/5. Light touch intact but pt c/o numbness  Cervical / Trunk Assessment: Normal  Communication   Communication: No difficulties  Cognition Arousal/Alertness: Awake/alert Behavior During Therapy: WFL for tasks assessed/performed Overall Cognitive Status: Within Functional Limits for tasks assessed                      General Comments      Exercises        Assessment/Plan    PT Assessment Patient needs continued PT services  PT Diagnosis  Acute pain;Difficulty walking (LE paresis)   PT Problem List Decreased strength;Decreased activity tolerance;Decreased balance;Decreased coordination;Decreased mobility;Decreased knowledge of use of DME;Pain  PT Treatment Interventions DME instruction;Functional mobility training;Therapeutic activities;Patient/family education;Balance training;Therapeutic exercise   PT Goals (Current goals can be found in the Care Plan section) Acute Rehab PT Goals Patient Stated Goal: none stated PT Goal Formulation: With patient Time For Goal Achievement: 06/09/15 Potential to Achieve Goals: Fair    Frequency Min 3X/week   Barriers to discharge        Co-evaluation               End of Session   Activity Tolerance: Patient limited by fatigue Patient left: in bed;with call bell/phone within reach;with bed alarm set      Functional Assessment Tool Used: clinical judgement Functional Limitation: Mobility: Walking and moving around Mobility: Walking and Moving Around Current Status (X4481): At least 20 percent but less than 40 percent impaired, limited or restricted Mobility: Walking and Moving Around Goal Status [phone]): At least 1 percent but less than 20 percent impaired, limited or restricted    Time: 1400-1414 PT Time Calculation (min) (ACUTE ONLY): 14 min   Charges:   PT Evaluation $Initial PT Evaluation Tier I: 1 Procedure     PT G Codes:   PT G-Codes **NOT FOR INPATIENT CLASS** Functional Assessment Tool Used: clinical judgement Functional Limitation: Mobility: Walking and moving around Mobility: Walking and Moving Around Current Status (S9702): At least 20 percent but less than 40 percent impaired, limited or restricted Mobility: Walking and Moving Around Goal Status [phone]): At least 1 percent but less than 20 percent impaired, limited or restricted    Weston Anna, MPT Pager: [phone]

## 2015-05-26 NOTE — Progress Notes (Signed)
MRI tech called to make nursing staff aware that because the patient had an MRI with contrast the evening before (05/25/15), the pt can not have MRI contrast for 48 hours according to the MRI tech. Patient will be eligible to have next MRI with contrast tomorrow 05/27/2015 at 5pm. MD has been paged to relay this information. I will also report this to the oncoming night shift nurse. Will also inform the pt and his family and continue to monitor.

## 2015-05-26 NOTE — Progress Notes (Signed)
Inpatient Rehabilitation  Patient was screened by Gunnar Fusi for appropriateness for an Inpatient Acute Rehab consult.  At this time, patient is under observation status.  If patient should change to inpatient status then please consider ordering an Inpatient Rehab consult.  Thank you.    Carmelia Roller., CCC/SLP Admission Coordinator  [place]  Cell [phone]

## 2015-05-27 ENCOUNTER — Inpatient Hospital Stay (HOSPITAL_COMMUNITY): Payer: Medicaid Other | Admitting: Anesthesiology

## 2015-05-27 ENCOUNTER — Inpatient Hospital Stay (HOSPITAL_COMMUNITY): Payer: Medicaid Other

## 2015-05-27 ENCOUNTER — Encounter (HOSPITAL_COMMUNITY)
Admission: EM | Disposition: A | Discharge status: Hospice | Payer: Self-pay | Source: Home / Self Care | Attending: Internal Medicine

## 2015-05-27 ENCOUNTER — Ambulatory Visit (HOSPITAL_COMMUNITY)
Admit: 2015-05-27 | Discharge: 2015-05-27 | Disposition: A | Discharge status: Home / Self Care | Payer: Medicaid Other | Attending: Internal Medicine | Admitting: Internal Medicine

## 2015-05-27 ENCOUNTER — Encounter (HOSPITAL_COMMUNITY): Payer: Self-pay | Admitting: Anesthesiology

## 2015-05-27 DIAGNOSIS — R200 Anesthesia of skin: Secondary | ICD-10-CM | POA: Diagnosis present

## 2015-05-27 DIAGNOSIS — R079 Chest pain, unspecified: Secondary | ICD-10-CM | POA: Diagnosis not present

## 2015-05-27 DIAGNOSIS — Z515 Encounter for palliative care: Secondary | ICD-10-CM | POA: Diagnosis present

## 2015-05-27 DIAGNOSIS — C773 Secondary and unspecified malignant neoplasm of axilla and upper limb lymph nodes: Secondary | ICD-10-CM | POA: Diagnosis present

## 2015-05-27 DIAGNOSIS — C770 Secondary and unspecified malignant neoplasm of lymph nodes of head, face and neck: Secondary | ICD-10-CM | POA: Diagnosis present

## 2015-05-27 DIAGNOSIS — C7972 Secondary malignant neoplasm of left adrenal gland: Secondary | ICD-10-CM

## 2015-05-27 DIAGNOSIS — J90 Pleural effusion, not elsewhere classified: Secondary | ICD-10-CM | POA: Insufficient documentation

## 2015-05-27 DIAGNOSIS — C7970 Secondary malignant neoplasm of unspecified adrenal gland: Secondary | ICD-10-CM | POA: Diagnosis not present

## 2015-05-27 DIAGNOSIS — C7971 Secondary malignant neoplasm of right adrenal gland: Secondary | ICD-10-CM | POA: Insufficient documentation

## 2015-05-27 DIAGNOSIS — C3431 Malignant neoplasm of lower lobe, right bronchus or lung: Secondary | ICD-10-CM

## 2015-05-27 DIAGNOSIS — R338 Other retention of urine: Secondary | ICD-10-CM | POA: Diagnosis present

## 2015-05-27 DIAGNOSIS — N401 Enlarged prostate with lower urinary tract symptoms: Secondary | ICD-10-CM | POA: Diagnosis present

## 2015-05-27 DIAGNOSIS — F419 Anxiety disorder, unspecified: Secondary | ICD-10-CM | POA: Diagnosis present

## 2015-05-27 DIAGNOSIS — M5124 Other intervertebral disc displacement, thoracic region: Secondary | ICD-10-CM

## 2015-05-27 DIAGNOSIS — Z923 Personal history of irradiation: Secondary | ICD-10-CM | POA: Diagnosis not present

## 2015-05-27 DIAGNOSIS — J449 Chronic obstructive pulmonary disease, unspecified: Secondary | ICD-10-CM | POA: Diagnosis present

## 2015-05-27 DIAGNOSIS — R339 Retention of urine, unspecified: Secondary | ICD-10-CM

## 2015-05-27 DIAGNOSIS — M549 Dorsalgia, unspecified: Secondary | ICD-10-CM | POA: Diagnosis present

## 2015-05-27 DIAGNOSIS — J438 Other emphysema: Secondary | ICD-10-CM

## 2015-05-27 DIAGNOSIS — G839 Paralytic syndrome, unspecified: Secondary | ICD-10-CM | POA: Diagnosis present

## 2015-05-27 DIAGNOSIS — G893 Neoplasm related pain (acute) (chronic): Secondary | ICD-10-CM

## 2015-05-27 DIAGNOSIS — K5900 Constipation, unspecified: Secondary | ICD-10-CM | POA: Diagnosis present

## 2015-05-27 DIAGNOSIS — C3490 Malignant neoplasm of unspecified part of unspecified bronchus or lung: Secondary | ICD-10-CM | POA: Insufficient documentation

## 2015-05-27 DIAGNOSIS — C3430 Malignant neoplasm of lower lobe, unspecified bronchus or lung: Secondary | ICD-10-CM | POA: Diagnosis not present

## 2015-05-27 DIAGNOSIS — M5136 Other intervertebral disc degeneration, lumbar region: Secondary | ICD-10-CM | POA: Diagnosis present

## 2015-05-27 DIAGNOSIS — C801 Malignant (primary) neoplasm, unspecified: Secondary | ICD-10-CM | POA: Diagnosis not present

## 2015-05-27 DIAGNOSIS — M5125 Other intervertebral disc displacement, thoracolumbar region: Secondary | ICD-10-CM | POA: Diagnosis present

## 2015-05-27 DIAGNOSIS — Z87891 Personal history of nicotine dependence: Secondary | ICD-10-CM | POA: Diagnosis not present

## 2015-05-27 DIAGNOSIS — Z66 Do not resuscitate: Secondary | ICD-10-CM | POA: Diagnosis present

## 2015-05-27 DIAGNOSIS — C7951 Secondary malignant neoplasm of bone: Secondary | ICD-10-CM

## 2015-05-27 DIAGNOSIS — R531 Weakness: Secondary | ICD-10-CM | POA: Insufficient documentation

## 2015-05-27 DIAGNOSIS — G4700 Insomnia, unspecified: Secondary | ICD-10-CM | POA: Diagnosis present

## 2015-05-27 DIAGNOSIS — D649 Anemia, unspecified: Secondary | ICD-10-CM | POA: Diagnosis present

## 2015-05-27 DIAGNOSIS — C772 Secondary and unspecified malignant neoplasm of intra-abdominal lymph nodes: Secondary | ICD-10-CM | POA: Diagnosis present

## 2015-05-27 HISTORY — PX: RADIOLOGY WITH ANESTHESIA: SHX6223

## 2015-05-27 LAB — PROTIME-INR
INR: 1.14 (ref 0.00–1.49)
PROTHROMBIN TIME: 14.8 s (ref 11.6–15.2)

## 2015-05-27 LAB — CBC
HCT: 33.6 % — ABNORMAL LOW (ref 39.0–52.0)
HEMOGLOBIN: 11.1 g/dL — AB (ref 13.0–17.0)
MCH: 28.5 pg (ref 26.0–34.0)
MCHC: 33 g/dL (ref 30.0–36.0)
MCV: 86.4 fL (ref 78.0–100.0)
PLATELETS: 196 10*3/uL (ref 150–400)
RBC: 3.89 MIL/uL — AB (ref 4.22–5.81)
RDW: 14.3 % (ref 11.5–15.5)
WBC: 5.3 10*3/uL (ref 4.0–10.5)

## 2015-05-27 LAB — BASIC METABOLIC PANEL
ANION GAP: 8 (ref 5–15)
BUN: 11 mg/dL (ref 6–20)
CHLORIDE: 105 mmol/L (ref 101–111)
CO2: 22 mmol/L (ref 22–32)
Calcium: 9.1 mg/dL (ref 8.9–10.3)
Creatinine, Ser: 0.67 mg/dL (ref 0.61–1.24)
GFR calc Af Amer: >60 mL/min (ref >60)
GLUCOSE: 141 mg/dL — AB (ref 65–99)
POTASSIUM: 4.2 mmol/L (ref 3.5–5.1)
SODIUM: 135 mmol/L (ref 135–145)

## 2015-05-27 LAB — MRSA PCR SCREENING: MRSA BY PCR: NEGATIVE

## 2015-05-27 LAB — MAGNESIUM: MAGNESIUM: 1.9 mg/dL (ref 1.7–2.4)

## 2015-05-27 LAB — T4, FREE: Free T4: 0.98 ng/dL (ref 0.61–1.12)

## 2015-05-27 SURGERY — RADIOLOGY WITH ANESTHESIA
Anesthesia: General

## 2015-05-27 MED ORDER — LIDOCAINE HCL (CARDIAC) 20 MG/ML IV SOLN
INTRAVENOUS | Status: DC | PRN
  Administered 2015-05-27: 60 mg via INTRAVENOUS

## 2015-05-27 MED ORDER — GADOBENATE DIMEGLUMINE 529 MG/ML IV SOLN
13.0000 mL | Freq: Once | INTRAVENOUS | Status: AC | PRN
  Administered 2015-05-27: 13 mL via INTRAVENOUS

## 2015-05-27 MED ORDER — PHENYLEPHRINE HCL 10 MG/ML IJ SOLN
INTRAMUSCULAR | Status: DC | PRN
  Administered 2015-05-27 (×3): 80 ug via INTRAVENOUS

## 2015-05-27 MED ORDER — PROPOFOL 10 MG/ML IV BOLUS
INTRAVENOUS | Status: DC | PRN
  Administered 2015-05-27: 100 mg via INTRAVENOUS

## 2015-05-27 MED ORDER — ONDANSETRON HCL 4 MG/2ML IJ SOLN: 4.0000 mg | Freq: Once | INTRAMUSCULAR | Status: DC | PRN

## 2015-05-27 MED ORDER — TRAZODONE HCL 50 MG PO TABS
50.0000 mg | ORAL_TABLET | Freq: Every day | ORAL | Status: DC
  Administered 2015-05-27 – 2015-05-29 (×3): 50 mg via ORAL
  Filled 2015-05-27 (×3): qty 1

## 2015-05-27 MED ORDER — FENTANYL CITRATE (PF) 100 MCG/2ML IJ SOLN: 25.0000 ug | INTRAMUSCULAR | Status: DC | PRN

## 2015-05-27 MED ORDER — IPRATROPIUM-ALBUTEROL 0.5-2.5 (3) MG/3ML IN SOLN
3.0000 mL | Freq: Four times a day (QID) | RESPIRATORY_TRACT | Status: DC
  Administered 2015-05-27: 3 mL via RESPIRATORY_TRACT
  Filled 2015-05-27 (×3): qty 3

## 2015-05-27 MED ORDER — GUAIFENESIN ER 600 MG PO TB12
600.0000 mg | ORAL_TABLET | Freq: Two times a day (BID) | ORAL | Status: DC
  Administered 2015-05-28 – 2015-05-30 (×5): 600 mg via ORAL
  Filled 2015-05-27 (×7): qty 1

## 2015-05-27 MED ORDER — SUCCINYLCHOLINE CHLORIDE 20 MG/ML IJ SOLN
INTRAMUSCULAR | Status: DC | PRN
  Administered 2015-05-27: 60 mg via INTRAVENOUS

## 2015-05-27 NOTE — Progress Notes (Signed)
Nutrition Brief Note  Patient identified on the Malnutrition Screening Tool (MST) Report  Patient currently at [facility] for MRI. If returns to [facility], will complete assessment on 1/02.   Wt Readings from Last 15 Encounters:  05/25/15 139 lb 1.8 oz (63.1 kg)  05/25/15 147 lb (66.679 kg)  05/23/15 147 lb (66.679 kg)  05/18/15 147 lb 14.4 oz (67.087 kg)  05/15/15 152 lb 3.2 oz (69.037 kg)  05/08/15 152 lb (68.947 kg)  04/17/15 152 lb 11.2 oz (69.264 kg)  04/05/15 155 lb 8 oz (70.534 kg)  04/05/15 168 lb (76.204 kg)  03/27/15 158 lb 6.4 oz (71.85 kg)  03/16/15 156 lb 1.6 oz (70.806 kg)  11/08/12 151 lb (68.493 kg)  11/07/12 165 lb (74.844 kg)    Body mass index is 18.86 kg/(m^2). Patient meets criteria for normal range based on current BMI.   Current diet order is NPO for MRI.  Labs and medications reviewed.   If nutrition issues arise, please consult RD.   Clayton Bibles, MS, RD, LDN Pager: [phone] After Hours Pager: [phone]

## 2015-05-27 NOTE — Progress Notes (Signed)
  Radiation Oncology         [phone] ________________________________  Name: Barry Cook MRN: [id number]  Date: 05/25/2015  DOB: 07-08-1955  Chart Note:  I met and examined patient last night.  He does have lower extremity weakness without a 'nerve level' to light touch.  I requested emergent MRI.  This was attempted last night, but, the patient was unable to tolerate the MRI to obtain thoracic imaging.  Accordingly, he underwent MRI at [facility] with anesthesia this afternoon.  The final report is not yet available.  I have reviewed the images on PACS and will await the radiology interpretation.  ________________________________  Sheral Apley Tammi Klippel, M.D.

## 2015-05-27 NOTE — Progress Notes (Addendum)
PROGRESS NOTE  Barry Cook:[id number] DOB: [birth year] DOA: 05/25/2015 PCP: No PCP Per Patient  HPI/Recap of past 24 hours: Reported not able to sleepwell, no bm, still have breakthrough pain, not able to lift bilateral lower leg against gravity, able lift arms above head, does c/o numbness in all extremities. 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 D/c decodron if mri no cord compression. Radiation oncology Dr. Tammi Klippel requested patient to have MRI done under anesthesia due to patient's inability to get through regular mri due to sob and involuntory movement during procedure. Plan to have patient come back to [place] after procedure, but there might be a change that patient has to stay in the neuro ICU at [facility] pending MRI findings. I have talked to radiation oncologist Dr Tammi Klippel and anesthesiology Dr Llana Aliment over the phone.    Copd: + wheezing, schedule nebs, on steroids. Consider abx if not improving or develop fever. cxr with known large right lower lobe lung mass with central cavitation and surrounding opacities. Superimposed pneumonia remains a concern.  cancer pain:  started ms contin due to frequent breakthrough pain, continue titrate  Urinary retention: on flomax, foley in until see urology in a week  Constipation: stool softner  Insomnia: patient requested trazadon '50mg'$  po qhs at  9pm.  Metastatic lung ca on immunotherapy, oncology consulted  Addendum: 5pm, per radiation oncology Dr Tammi Klippel, no cord compression  By mri, will order ct head.  Bilateral lower extremity weakness unexplained, ? 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 (neuropathy/myopathy), will check ck  Code Status: full  Family Communication: patient and multiple family members in room  Disposition Plan: to  [place] the have MRI under anesthesia, hopefully he can come back to [place] long.   Consultants:  Oncology (dr Julien Nordmann)  Radiation oncology (dr Tammi Klippel)  Neurosurgery (Dr. [name])  anesthesiology  Procedures:  Mri under anesthesia on 12/31  Antibiotics:  none   Objective: BP 113/71 mmHg  Pulse 84  Temp(Src) 98 F (36.7 C) (Oral)  Resp 18  Ht 6' (1.829 m)  Wt 139 lb 1.8 oz (63.1 kg)  BMI 18.86 kg/m2  SpO2 100%  Intake/Output Summary (Last 24 hours) at 05/27/15 1113 Last data filed at 05/27/15 0800  Gross per 24 hour  Intake    240 ml  Output   3100 ml  Net  -2860 ml   Filed Weights   05/25/15 2115  Weight: 139 lb 1.8 oz (63.1 kg)    Exam:   General:  NAD, frail, thin  Cardiovascular: RRR  Respiratory: mild wheezing bilaterally  Abdomen: Soft/ND/NT, positive BS  Musculoskeletal: No Edema  Neuro: not able to lift legs against gravity, sensation intact. aaox3  Data Reviewed: Basic Metabolic Panel:  Recent Labs Lab 05/23/15 1533 05/25/15 1300 05/26/15 0435 05/27/15 0450  NA 134* 134* 138 135  K 4.2 4.5 4.3 4.2  CL 100* 102 105 105  CO2 '26 24 26 '$ 22  GLUCOSE 82 97 102* 141*  BUN '20 16 14 11  '$ CREATININE 0.78 0.67 0.76 0.67  CALCIUM 9.0 9.4 8.8* 9.1  MG  --   --   --  1.9   Liver Function Tests:  Recent Labs Lab 05/25/15 1300 05/26/15 0435  AST 18 13*  ALT 18 16*  ALKPHOS 73 66  BILITOT 0.5 0.6  PROT 6.8 6.0*  ALBUMIN 3.2* 2.8*   No results for input(s): LIPASE, AMYLASE in the last 168 hours. No  results for input(s): AMMONIA in the last 168 hours. CBC:  Recent Labs Lab 05/23/15 1533 05/25/15 1300 05/26/15 0435 05/27/15 0450  WBC 4.8 6.6 5.0 5.3  NEUTROABS 2.2 3.2 2.5  --   HGB 10.5* 11.7* 10.7* 11.1*  HCT 31.2* 36.7* 33.2* 33.6*  MCV 86.9 87.6 88.5 86.4  PLT 225 200 196 196   Cardiac Enzymes:   No results for input(s): CKTOTAL, CKMB, CKMBINDEX, TROPONINI in the last 168 hours. BNP (last 3 results) No results for input(s): BNP in the last 8760 hours.  ProBNP (last 3 results) No results for input(s): PROBNP in the last 8760 hours.  CBG: No results for input(s): GLUCAP in the last 168 hours.  Recent Results (from the past 240 hour(s))  Urine culture     Status: None   Collection Time: 05/23/15  2:10 PM  Result Value Ref Range Status   Specimen Description URINE, CLEAN CATCH  Final   Special Requests NONE  Final   Culture   Final    NO GROWTH 1 DAY Performed at [facility]    Report Status 05/24/2015 FINAL  Final     Studies: Mr Cervical Spine Wo Contrast  05/26/2015  ADDENDUM REPORT: 05/26/2015 21:49 ADDENDUM: Acute findings discussed with and reconfirmed by Dr.MATTHEW MANNING on 05/26/2015 at 9:30 pm. Electronically Signed   By: Elon Alas M.D.   On: 05/26/2015 21:49  05/26/2015  CLINICAL DATA:  Progressive paralysis, unable to the legs. Assess for cord compression. History of metastatic lung cancer, radiation. EXAM: MRI CERVICAL SPINE WITHOUT CONTRAST TECHNIQUE: Multiplanar, multisequence MR imaging of the cervical spine was performed. No intravenous contrast was administered. COMPARISON:  MRI of the cervical spine March 07, 2015 in CT cervical spine FINDINGS: Moderate to severely motion degraded examination. Again noted is abnormal low T1, bright STIR signal within vertebral body C6, inferior endplate of C5 and superior endplate of C7 though degraded by motion, the degree of bone marrow signal abnormality appears slightly worse. No pathologic  fracture. Grade 1 C4-5 retrolisthesis gait, stable from prior imaging. Maintenance of cervical lordosis. Scattered old Schmorl's nodes. Due to motion, limited assessment for cord signal abnormality. No definite syrinx. No cerebellar tonsillar ectopia. Re- demonstration of bright STIR signal within the RIGHT paraspinal soft tissues at C5-6, associated with bone marrow edema, present on prior imaging though, suspected to be worse today. Due to motion, significantly limited assessment of the individual levels. However C2-3: No definite disc bulge, canal stenosis or neural foraminal narrowing. C3-4: Small suspected broad-based disc bulge, uncovertebral hypertrophy and facet arthropathy with mild canal stenosis and probable at least moderate neural foraminal narrowing. C4-5: Retrolisthesis. Small broad-based disc bulge, uncovertebral hypertrophy and facet arthropathy result in moderate canal stenosis, probable at least moderate RIGHT and moderate to severe LEFT neural foraminal narrowing. C5-6: Small broad-based disc bulge, uncovertebral hypertrophy and facet arthropathy resulting in moderate canal stenosis and probable moderate to severe neural foraminal narrowing. C6-7: Small broad-based disc bulge, uncovertebral hypertrophy and facet  arthropathy resulting in moderate canal stenosis and probable severe neural foraminal narrowing. C7-T1: No definite disc bulge, canal stenosis or neural foraminal narrowing. IMPRESSION: Moderate to severely motion degraded examination. Worsening abnormal signal within C6, and C5/C7 endplates concerning for metastatic disease or possibly infection. Persistent abnormal signal in RIGHT C4-5 facet associated with mid grade paraspinal muscle strain/myositis. Suspected moderate canal stenosis C4-5 through C6-7. Motion limits assessment for cord edema. No syrinx. Suspected neural foraminal narrowing at C3-4 thru C6-7, likely severe at C6-7. Electronically Signed: By: Elon Alas M.D. On:  05/26/2015 21:06   Dg Chest Port 1 View  05/27/2015  CLINICAL DATA:  Acute onset of shortness of breath. Initial encounter. EXAM: PORTABLE CHEST 1 VIEW COMPARISON:  CTA of the chest performed 05/09/2015 FINDINGS: The patient's known large right lower lobe lung mass is better characterized on prior CTA, with central cavitation and surrounding opacities. Superimposed pneumonia remains a concern. Mild vascular congestion is noted. No pleural effusion or pneumothorax is seen. The cardiomediastinal silhouette remains normal in size. No acute osseous abnormalities are identified. IMPRESSION: Known large right lower lobe lung mass is better characterized on prior CTA, with central cavitation and surrounding opacities. Superimposed pneumonia remains a concern. Mild vascular congestion noted. Electronically Signed   By: Garald Balding M.D.   On: 05/27/2015 02:38    Scheduled Meds: . dexamethasone  4 mg Intravenous 4 times per day  . enoxaparin (LOVENOX) injection  40 mg Subcutaneous QHS  . famotidine  20 mg Oral BID  . fentaNYL  25 mcg Transdermal Q72H  . [START ON 05/28/2015] guaiFENesin  600 mg Oral BID  . ipratropium-albuterol  3 mL Nebulization Q6H  . megestrol  400 mg Oral Daily  . morphine  15 mg Oral Q12H  . multivitamin with minerals  1 tablet Oral Daily  . polyethylene glycol  17 g Oral BID  . senna-docusate  1 tablet Oral BID  . tamsulosin  0.4 mg Oral QPC supper  . traZODone  50 mg Oral QHS    Continuous Infusions: . sodium chloride 100 mL/hr at 05/25/15 2240     Time spent: 55mns  Maison Agrusa MD, PhD  [facility] Pager [phone] If 7PM-7AM, please contact night-coverage at [email or web address], password [email or web address]12/31/2016, 11:13 AM

## 2015-05-27 NOTE — Anesthesia Preprocedure Evaluation (Addendum)
Anesthesia Evaluation  Patient identified by MRN, date of birth, ID band Patient awake    Reviewed: Allergy & Precautions, H&P , NPO status , Patient's Chart, lab work & pertinent test results  History of Anesthesia Complications Negative for: history of anesthetic complications  Airway Mallampati: III  TM Distance: >3 FB Neck ROM: full    Dental  (+) Edentulous Upper, Edentulous Lower   Pulmonary COPD, former smoker,    Pulmonary exam normal breath sounds clear to auscultation       Cardiovascular negative cardio ROS Normal cardiovascular exam Rhythm:regular Rate:Normal     Neuro/Psych Pt has pulmonary mets to his spine, cervical and thoracic Has involuntary movements    GI/Hepatic negative GI ROS, Neg liver ROS,   Endo/Other  negative endocrine ROS  Renal/GU negative Renal ROS     Musculoskeletal 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 to spine   Abdominal   Peds  Hematology  (+) anemia ,   Anesthesia Other Findings   Reproductive/Obstetrics negative OB ROS                            Anesthesia Physical Anesthesia Plan  ASA: IV  Anesthesia Plan: General   Post-op Pain Management:    Induction: Intravenous  Airway Management Planned: Oral ETT  Additional Equipment:   Intra-op Plan:   Post-operative Plan: Possible Post-op intubation/ventilation  Informed Consent: I have reviewed the patients History and Physical, chart, labs and discussed the procedure including the risks, benefits and alternatives for the proposed anesthesia with the patient or authorized representative who has indicated his/her understanding and acceptance.   Dental Advisory Given  Plan Discussed with: Anesthesiologist and CRNA  Anesthesia Plan Comments: (Patient will need MRI given questionable change is spinal mets from lung Pt has had cervical radiation, will have glidescope available in case  difficulty encountered)        Anesthesia Quick Evaluation

## 2015-05-27 NOTE — Transfer of Care (Signed)
Immediate Anesthesia Transfer of Care Note  Patient: Barry Cook  Procedure(s) Performed: Procedure(s): RADIOLOGY WITH ANESTHESIA (N/A)  Patient Location: PACU  Anesthesia Type:General  Level of Consciousness: awake and alert   Airway & Oxygen Therapy: Patient Spontanous Breathing  Post-op Assessment: Report given to RN  Post vital signs: Reviewed  Last Vitals:  Filed Vitals:   05/27/15 1608 05/27/15 1622  BP:  118/71  Pulse: 104 94  Temp:    Resp: 20 14    Complications: No apparent anesthesia complications

## 2015-05-27 NOTE — Anesthesia Postprocedure Evaluation (Signed)
Anesthesia Post Note  Patient: Barry Cook  Procedure(s) Performed: Procedure(s) (LRB): RADIOLOGY WITH ANESTHESIA (N/A)  Patient location during evaluation: PACU Anesthesia Type: General Level of consciousness: awake and alert Pain management: pain level controlled Vital Signs Assessment: post-procedure vital signs reviewed and stable Respiratory status: spontaneous breathing, nonlabored ventilation, respiratory function stable and patient connected to nasal cannula oxygen Cardiovascular status: blood pressure returned to baseline and stable Postop Assessment: no signs of nausea or vomiting Anesthetic complications: no    Last Vitals:  Filed Vitals:   05/27/15 1608 05/27/15 1622  BP:  118/71  Pulse: 104 94  Temp:    Resp: 20 14    Last Pain:  Filed Vitals:   05/27/15 1626  PainSc: 2                  Zenaida Deed

## 2015-05-27 NOTE — Progress Notes (Signed)
  Radiation Oncology         [phone] ________________________________  Name: Barry Cook MRN: [id number]  Date: 05/25/2015  DOB: [birth year]  Chart Note:  MRI report shows no spinal cord compression.  Etiology of leg weakness is not clear.  Additional imaging (head CT, abd/pelv CT) and other work-up may be warranted.  ________________________________  Sheral Apley. Tammi Klippel, M.D.

## 2015-05-28 DIAGNOSIS — R0602 Shortness of breath: Secondary | ICD-10-CM

## 2015-05-28 DIAGNOSIS — G4700 Insomnia, unspecified: Secondary | ICD-10-CM

## 2015-05-28 DIAGNOSIS — G839 Paralytic syndrome, unspecified: Secondary | ICD-10-CM | POA: Insufficient documentation

## 2015-05-28 DIAGNOSIS — Z515 Encounter for palliative care: Secondary | ICD-10-CM | POA: Insufficient documentation

## 2015-05-28 DIAGNOSIS — K5909 Other constipation: Secondary | ICD-10-CM

## 2015-05-28 DIAGNOSIS — R262 Difficulty in walking, not elsewhere classified: Secondary | ICD-10-CM

## 2015-05-28 LAB — BASIC METABOLIC PANEL
ANION GAP: 7 (ref 5–15)
BUN: 11 mg/dL (ref 6–20)
CO2: 23 mmol/L (ref 22–32)
Calcium: 8.8 mg/dL — ABNORMAL LOW (ref 8.9–10.3)
Chloride: 107 mmol/L (ref 101–111)
Creatinine, Ser: 0.45 mg/dL — ABNORMAL LOW (ref 0.61–1.24)
GFR calc Af Amer: >60 mL/min (ref >60)
GLUCOSE: 119 mg/dL — AB (ref 65–99)
POTASSIUM: 4 mmol/L (ref 3.5–5.1)
Sodium: 137 mmol/L (ref 135–145)

## 2015-05-28 LAB — CBC
HEMATOCRIT: 30.2 % — AB (ref 39.0–52.0)
Hemoglobin: 9.9 g/dL — ABNORMAL LOW (ref 13.0–17.0)
MCH: 28.4 pg (ref 26.0–34.0)
MCHC: 32.8 g/dL (ref 30.0–36.0)
MCV: 86.5 fL (ref 78.0–100.0)
Platelets: 181 10*3/uL (ref 150–400)
RBC: 3.49 MIL/uL — AB (ref 4.22–5.81)
RDW: 14.7 % (ref 11.5–15.5)
WBC: 8.4 10*3/uL (ref 4.0–10.5)

## 2015-05-28 LAB — CK: Total CK: 59 U/L (ref 49–397)

## 2015-05-28 LAB — MAGNESIUM: Magnesium: 1.9 mg/dL (ref 1.7–2.4)

## 2015-05-28 MED ORDER — IPRATROPIUM-ALBUTEROL 0.5-2.5 (3) MG/3ML IN SOLN
3.0000 mL | Freq: Four times a day (QID) | RESPIRATORY_TRACT | Status: DC
  Administered 2015-05-28 – 2015-05-30 (×8): 3 mL via RESPIRATORY_TRACT
  Filled 2015-05-28 (×9): qty 3

## 2015-05-28 MED ORDER — SODIUM CHLORIDE 0.9 % IV SOLN
Freq: Once | INTRAVENOUS | Status: AC
Start: 2015-05-28 — End: 2015-05-28
  Administered 2015-05-28: 16:00:00 via INTRAVENOUS

## 2015-05-28 MED ORDER — ZOLPIDEM TARTRATE 5 MG PO TABS
5.0000 mg | ORAL_TABLET | Freq: Every evening | ORAL | Status: DC | PRN
  Administered 2015-05-29: 5 mg via ORAL
  Filled 2015-05-28: qty 1

## 2015-05-28 MED ORDER — FENTANYL 50 MCG/HR TD PT72
50.0000 ug | MEDICATED_PATCH | TRANSDERMAL | Status: DC
  Administered 2015-05-28: 50 ug via TRANSDERMAL
  Filled 2015-05-28: qty 1

## 2015-05-28 MED ORDER — OXYCODONE HCL 5 MG PO TABS
15.0000 mg | ORAL_TABLET | ORAL | Status: DC | PRN
  Administered 2015-05-28 – 2015-05-30 (×5): 15 mg via ORAL
  Filled 2015-05-28 (×5): qty 3

## 2015-05-28 MED ORDER — MINERAL OIL RE ENEM
1.0000 | ENEMA | Freq: Once | RECTAL | Status: DC
  Filled 2015-05-28: qty 1

## 2015-05-28 MED ORDER — IPRATROPIUM-ALBUTEROL 0.5-2.5 (3) MG/3ML IN SOLN: 3.0000 mL | RESPIRATORY_TRACT | Status: DC | PRN

## 2015-05-28 MED ORDER — LORAZEPAM 2 MG/ML IJ SOLN
1.0000 mg | Freq: Four times a day (QID) | INTRAMUSCULAR | Status: DC | PRN
  Administered 2015-05-29 – 2015-05-30 (×2): 1 mg via INTRAVENOUS
  Filled 2015-05-28 (×2): qty 1

## 2015-05-28 MED ORDER — BISACODYL 10 MG RE SUPP
10.0000 mg | Freq: Every day | RECTAL | Status: DC | PRN
  Administered 2015-05-28: 10 mg via RECTAL
  Filled 2015-05-28: qty 1

## 2015-05-28 NOTE — Progress Notes (Signed)
Pt c/o legs "tingling" R>L.  Pt states that IV ativan controlled leg pain/tingling. Requested MD to be paged.  Awaiting MD to return page.

## 2015-05-28 NOTE — Progress Notes (Signed)
Pt reports mild constipation.  MD ordered mineral oil enema x1.  Pt states he would prefer to try suppository first.  Offered several times to give suppository but has declined due to family visits.  Will continue to offer.  States would probably rather have this evening.

## 2015-05-28 NOTE — Progress Notes (Signed)
Pt's 25 mcg Fent patch removed and wasted in sharps by Mardi Mainland, RN.  New 44mg Fent patch placed on R arm.

## 2015-05-28 NOTE — Progress Notes (Signed)
MD made aware of pt's blood pressure, 88/60 manually.  1 L bolus ordered.

## 2015-05-28 NOTE — Progress Notes (Addendum)
PROGRESS NOTE  OSTIN MATHEY ZOX:[id number] DOB: [birth year] DOA: 05/25/2015 PCP: No PCP Per Patient  HPI/Recap of past 24 hours: Reported not able to sleepwell, no bm, still have breakthrough pain, not able to lift bilateral lower leg against gravity, able lift arms above head, does c/o numbness in all extremities. 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  Patient underwent mri under anesthesia, on 12/30, mri entire spine did showed multiple mets but no overt cord compression. Ct head no acute findings, Decadron d/ced. Bilateral lower extremity weakness unexplained, ? 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 (neuropathy/myopathy),  Ck wnl, rad onc recommended CT ab/pel  Which was done on 12/27.  Impatient rehab was consulted on admission, patient and family also want to talk to palliative care to explore options moving forward in the setting of widely metastatic lung cancer.    Copd: less  wheezing, continue schedule nebs, no fever, mild cough  Consider abx if not improving or develop fever. cxr with known large right lower lobe lung mass with central cavitation and surrounding opacities. Superimposed pneumonia remains a concern.   Chest pain: echo pending, likely from lung ca  cancer pain:  started ms contin due to  frequent breakthrough pain, continue titrate  Urinary retention: on flomax, foley in until see urology in a week  Constipation: stool softner, want enema, no bm for a week.  Insomnia: patient requested trazadon '50mg'$  po qhs at 9pm. Reported traadon not helping, Want to try something else,start ambien qhs prn.  Metastatic lung ca on immunotherapy, oncology consulted    Code Status: full  Family Communication: patient and wife in room  Disposition Plan: pending   Consultants:  Oncology (dr Julien Nordmann)  Radiation oncology (dr Tammi Klippel)  Neurosurgery (Dr. [name])  Anesthesiology  Palliative care  Procedures:  Mri under anesthesia on 12/31  Antibiotics:  none   Objective: BP 104/67 mmHg  Pulse 69  Temp(Src) 98.2 F (36.8 C) (Oral)  Resp 16  Ht 6' (1.829 m)  Wt 139 lb 1.8 oz (63.1 kg)  BMI 18.86 kg/m2  SpO2 100%  Intake/Output Summary (Last 24 hours) at 05/28/15 1037 Last data filed at 05/28/15 0600  Gross per 24 hour  Intake   1047 ml  Output   1250 ml  Net   -203 ml   Filed Weights   05/25/15 2115  Weight: 139 lb 1.8 oz (63.1 kg)    Exam:   General:  NAD, frail, thin  Cardiovascular: RRR  Respiratory: mild wheezing bilaterally  Abdomen: Soft/ND/NT, positive BS  Musculoskeletal: No Edema  Neuro: not able to lift legs against gravity, sensation intact. aaox3  Data Reviewed: Basic Metabolic Panel:  Recent Labs Lab 05/23/15 1533 05/25/15 1300 05/26/15 0435 05/27/15 0450 05/28/15 0506  NA 134* 134* 138 135 137  K 4.2 4.5 4.3 4.2 4.0  CL 100* 102 105 105 107  CO2 26 24  $'26 22 23  'B$ GLUCOSE 82 97 102* 141* 119*  BUN '20 16 14 11 11  '$ CREATININE 0.78 0.67 0.76 0.67 0.45*  CALCIUM 9.0 9.4 8.8* 9.1 8.8*  MG  --   --   --  1.9 1.9   Liver Function Tests:  Recent Labs Lab 05/25/15 1300 05/26/15 0435  AST 18 13*  ALT 18 16*  ALKPHOS 73 66  BILITOT 0.5 0.6  PROT 6.8 6.0*  ALBUMIN 3.2* 2.8*   No results for input(s): LIPASE, AMYLASE in  the last 168 hours. No results for input(s): AMMONIA in the last 168 hours. CBC:  Recent Labs Lab 05/23/15 1533 05/25/15 1300 05/26/15 0435 05/27/15 0450 05/28/15 0506  WBC 4.8 6.6 5.0 5.3 8.4  NEUTROABS 2.2 3.2 2.5  --   --   HGB 10.5* 11.7* 10.7* 11.1* 9.9*  HCT 31.2* 36.7* 33.2* 33.6* 30.2*  MCV 86.9 87.6 88.5 86.4 86.5  PLT 225 200 196 196 181   Cardiac Enzymes:    Recent Labs Lab 05/28/15 0506  CKTOTAL 59   BNP (last 3 results) No results for input(s): BNP in the last 8760 hours.  ProBNP (last 3 results) No results for input(s): PROBNP in the last 8760 hours.  CBG: No results for input(s): GLUCAP in the last 168 hours.  Recent Results (from the past 240 hour(s))  Urine culture     Status: None   Collection Time: 05/23/15  2:10 PM  Result Value Ref Range Status   Specimen Description URINE, CLEAN CATCH  Final   Special Requests NONE  Final   Culture   Final    NO GROWTH 1 DAY Performed at [facility]    Report Status 05/24/2015 FINAL  Final  MRSA PCR Screening     Status: None   Collection Time: 05/27/15  5:38 PM  Result Value Ref Range Status   MRSA by PCR NEGATIVE NEGATIVE Final    Comment:        The GeneXpert MRSA Assay (FDA approved for NASAL specimens only), is one component of a comprehensive MRSA colonization surveillance program. It is not intended to diagnose MRSA infection nor to guide or monitor treatment for MRSA infections.      Studies: Ct Head Wo Contrast  05/27/2015  CLINICAL DATA:  60 year old male with frontal headache and increasing lower extremity weakness and numbness with falls. Metastatic lung cancer. EXAM: CT HEAD WITHOUT CONTRAST TECHNIQUE: Contiguous axial images were obtained from the base of the skull through the vertex without intravenous contrast. COMPARISON:  03/28/2015 MR FINDINGS: Mild chronic small-vessel white matter ischemic changes are again noted. No acute intracranial abnormalities are identified,  including mass lesion or mass effect, hydrocephalus, extra-axial fluid collection, midline shift, hemorrhage, or acute infarction. The visualized bony calvarium is unremarkable. A small amount of fluid within both maxillary sinuses noted. IMPRESSION: No evidence of acute intracranial abnormality. Mild chronic small-vessel white matter ischemic changes. Small amount of fluid within both maxillary sinuses which may represent acute sinusitis -correlate clinically. Electronically Signed   By: Margarette Canada M.D.   On: 05/27/2015 18:41   Mr Cervical Spine W Contrast  05/27/2015  CLINICAL DATA:  Suspected spinal cord compression. Leg weakness and urinary retention. No sensory level. Lung cancer. History of cervical radiation. EXAM: MRI CERVICAL SPINE WITH CONTRAST; MRI THORACIC SPINE WITHOUT AND WITH CONTRAST TECHNIQUE: Multiplanar and multiecho pulse sequences of the cervical spine, to include the craniocervical junction and cervicothoracic junction, were obtained according to standard protocol with  intravenous contrast.; Multiplanar and multiecho pulse sequences of the thoracic spine were obtained without and with intravenous contrast. CONTRAST:  105m MULTIHANCE GADOBENATE DIMEGLUMINE 529 MG/ML IV SOLN COMPARISON:  MRI cervical spine without contrast motion degraded. CT chest 05/09/2015. PET scan 03/28/2015. MRI lumbar spine 05/25/2015. MR head 03/28/2015. FINDINGS: MRI THORACIC SPINE WITHOUT AND WITH CONTRAST: Numbering of the thoracic spine was accomplished by counting down from the odontoid. Widespread degenerative disease is present, similar to that noted in the lumbar spine on previous study, with associated discogenic edema and enhancement most prominent at T6-7 and T8-9. There are in addition metastatic in the thoracic spine. The largest metastasis is at T5 with near complete replacement of that vertebral body. Smaller lesions are seen in T4, T7, and T9. No pathologic fracture is evident. No intraspinal  enhancement to suggest epidural tumor. No enhancement or enlargement of the cord to suggest intramedullary spread of neoplasm. Fairly multiple prominent thoracic disc protrusions are observed, which could contribute to back pain, but do not significantly compress the cord. These are noted at T8-9 on LEFT, T7-8 RIGHT paramedian, and T9-10 RIGHT paramedian. RIGHT greater than LEFT pleural effusions. RIGHT lower lobe mass. BILATERAL adrenal metastases. MRI CERVICAL SPINE WITH CONTRAST: Post infusion imaging of the cervical spine was performed. There is mild canal stenosis due to cervical spondylosis with disc osteophyte complex at C4-5, C5-6, and C6-7. The patient has undergone radiation to the destructive mass at C6. Some residual vertebral body enhancement is present at C6 notably the RIGHT posterior elements. Paravertebral soft tissue at this location is confirmed. RIGHT vertebral artery is poorly visualized. Equivocal central T2 hyperintensity of the cord from C3 through C7 without cord enlargement. This is favored to represent artifact. No enhancement is seen. No features suggestive of cord infarction or radiation injury. IMPRESSION: No epidural tumor or significant cord compression in the thoracic spine. Multiple thoracic vertebral body metastases without pathologic fracture, largest at T5. Multiple thoracic disc protrusions, noncompressive. RIGHT greater than LEFT thoracic pleural effusions. RIGHT lower lobe mass. BILATERAL adrenal metastases. Post XRT changes in the cervical spine, with persistent abnormal enhancement of the C6 vertebral body extending into the RIGHT posterior elements. No cervical spine epidural tumor or compressive lesion is observed. Findings discussed with Dr. MTammi Klippelat approximately 3:45 p.m. by telephone. Electronically Signed   By: JStaci RighterM.D.   On: 05/27/2015 17:14   Mr Thoracic Spine W Wo Contrast  05/27/2015  CLINICAL DATA:  Suspected spinal cord compression. Leg weakness  and urinary retention. No sensory level. Lung cancer. History of cervical radiation. EXAM: MRI CERVICAL SPINE WITH CONTRAST; MRI THORACIC SPINE WITHOUT AND WITH CONTRAST TECHNIQUE: Multiplanar and multiecho pulse sequences of the cervical spine, to include the craniocervical junction and cervicothoracic junction, were obtained according to standard protocol with intravenous contrast.; Multiplanar and multiecho pulse sequences of the thoracic spine were obtained without and with intravenous contrast. CONTRAST:  152mMULTIHANCE GADOBENATE DIMEGLUMINE 529 MG/ML IV SOLN COMPARISON:  MRI cervical spine without contrast motion degraded. CT chest 05/09/2015. PET scan 03/28/2015. MRI lumbar spine 05/25/2015. MR head 03/28/2015. FINDINGS: MRI THORACIC SPINE WITHOUT AND WITH CONTRAST: Numbering of the thoracic spine was accomplished by counting down from the odontoid. Widespread degenerative disease is present, similar to that noted in the lumbar spine on previous study, with associated discogenic edema and enhancement most prominent at T6-7 and T8-9. There are in addition metastatic in the thoracic spine. The largest metastasis is at T5 with near complete replacement of that vertebral  body. Smaller lesions are seen in T4, T7, and T9. No pathologic fracture is evident. No intraspinal enhancement to suggest epidural tumor. No enhancement or enlargement of the cord to suggest intramedullary spread of neoplasm. Fairly multiple prominent thoracic disc protrusions are observed, which could contribute to back pain, but do not significantly compress the cord. These are noted at T8-9 on LEFT, T7-8 RIGHT paramedian, and T9-10 RIGHT paramedian. RIGHT greater than LEFT pleural effusions. RIGHT lower lobe mass. BILATERAL adrenal metastases. MRI CERVICAL SPINE WITH CONTRAST: Post infusion imaging of the cervical spine was performed. There is mild canal stenosis due to cervical spondylosis with disc osteophyte complex at C4-5, C5-6, and  C6-7. The patient has undergone radiation to the destructive mass at C6. Some residual vertebral body enhancement is present at C6 notably the RIGHT posterior elements. Paravertebral soft tissue at this location is confirmed. RIGHT vertebral artery is poorly visualized. Equivocal central T2 hyperintensity of the cord from C3 through C7 without cord enlargement. This is favored to represent artifact. No enhancement is seen. No features suggestive of cord infarction or radiation injury. IMPRESSION: No epidural tumor or significant cord compression in the thoracic spine. Multiple thoracic vertebral body metastases without pathologic fracture, largest at T5. Multiple thoracic disc protrusions, noncompressive. RIGHT greater than LEFT thoracic pleural effusions. RIGHT lower lobe mass. BILATERAL adrenal metastases. Post XRT changes in the cervical spine, with persistent abnormal enhancement of the C6 vertebral body extending into the RIGHT posterior elements. No cervical spine epidural tumor or compressive lesion is observed. Findings discussed with Dr. Tammi Klippel at approximately 3:45 p.m. by telephone. Electronically Signed   By: Staci Righter M.D.   On: 05/27/2015 17:14    Scheduled Meds: . enoxaparin (LOVENOX) injection  40 mg Subcutaneous QHS  . famotidine  20 mg Oral BID  . fentaNYL  25 mcg Transdermal Q72H  . guaiFENesin  600 mg Oral BID  . ipratropium-albuterol  3 mL Nebulization QID  . megestrol  400 mg Oral Daily  . mineral oil  1 enema Rectal Once  . morphine  15 mg Oral Q12H  . multivitamin with minerals  1 tablet Oral Daily  . polyethylene glycol  17 g Oral BID  . senna-docusate  1 tablet Oral BID  . tamsulosin  0.4 mg Oral QPC supper  . traZODone  50 mg Oral QHS    Continuous Infusions: . sodium chloride 100 mL/hr at 05/28/15 0559     Time spent: 67mns  Nilda Keathley MD, PhD  [facility] Pager [phone] If 7PM-7AM, please contact night-coverage at [email or web address], password  [facility]1/05/2015, 10:37 AM  LOS: 1 day

## 2015-05-28 NOTE — Progress Notes (Signed)
Suppository given per pt request instead of mineral oil enema.  Pt agreeable to do enema if no results from suppository.

## 2015-05-28 NOTE — Consult Note (Signed)
Consultation Note Date: 05/28/2015   Patient Name: Barry Cook  DOB: January 31, 1956  MRN: [id number]  Age / Sex: 60 y.o., male  PCP: No Pcp Per Patient Referring Physician: Florencia Reasons, MD  Reason for Consultation: Establishing goals of care and pain and non pain symptom management.   Life limiting illness: lung cancer.   Clinical Assessment/Narrative:   60 yo gentleman with history of lung cancer, patient of Barry Cook, undergoing immunotherapy. 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 Patient with progressive bilateral LE weakness and pain. He was admitted to hospitalist service. He underwent MRI showing no cord compression but showing metastatic disease to spine.  Patient with pain and constipation, wife reportedly asking about hospice. Palliative consulted.  Patient resting in bed, states megace is helping his appetite. He has worsening weakness in both legs, R worse than L as well as weakness in RUE. He has some pain in his thoracic and lumbar area in the back. He has foley due to urinary retention. No dyspnea. Has constipation. No anxiety.   Contacts/Participants in Discussion: Primary Decision Maker:  wife Relationship to Patient  wife HCPOA: yes   wife  SUMMARY OF RECOMMENDATIONS: Pain management:  Continue IV DIlaudid PRN, D/C fentanyl IV PRN ( short acting, not providing long lasting relief to patient. D/C MS contin as patient already on Fentanyl patch, increase strength of Fentanyl patch. Change OxyIR to 15 mg PO Q 3 hours prn.  Constipation: continue Senokot, consider Dulcolax rectal if no results with enema.  Goals of care: wife reportedly asked about hospice, unfortunately, she was not in the room, will touch base with wife and Barry Cook in am.    Code Status/Advance Care Planning: Full code    Code Status Orders        Start     Ordered   05/25/15 2125  Full code    Continuous     05/25/15 2124      Other Directives:Other  Symptom Management:    as above  Palliative Prophylaxis:   Bowel Regimen  Additional Recommendations (Limitations, Scope, Preferences):  Full Scope Treatment     Psycho-social/Spiritual:  Support System: Strong Desire for further Chaplaincy support:no Additional Recommendations: Caregiving  Support/Resources  Prognosis: Unable to determine  Discharge Planning: pending hospitalization course   Chief Complaint/ Primary Diagnoses: Present on Admission:  . Intractable back pain . Lung cancer, lower lobe ([place]) . 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 Anemia . COPD (chronic obstructive pulmonary disease) ([place]) . 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 Back pain  I have reviewed the medical record, interviewed the patient and family, and examined the patient. The following aspects are pertinent.  Past Medical History  Diagnosis Date  . Radiation 03/30/15 - 04/14/15    cervical spine area 30 Gy  . COPD (chronic obstructive pulmonary disease) ([place])   . Enlarged prostate   . Cancer ([place])     Lung  . Metastatic cancer ([place])   . MRSA (methicillin resistant Staphylococcus aureus)    Social History   Social History  . Marital Status: Married    Spouse Name: N/A  . Number of Children: N/A  . Years of Education: N/A   Social History Main Topics  . Smoking status: Former Smoker -- 1.00 packs/day    Types: Cigarettes  . Smokeless tobacco: Never Used  . Alcohol Use: 7.2 oz/week    12 Cans of beer per week     Comment: occasional  beer since Oct 2012  . Drug Use: Yes    Special: Cocaine     Comment: no cocaine in 2 to 3 years  . Sexual Activity: Yes   Other Topics Concern  . None   Social History Narrative   Family History  Problem Relation Age of Onset  . Throat cancer Brother    Scheduled Meds: . enoxaparin (LOVENOX) injection  40 mg Subcutaneous QHS  .  famotidine  20 mg Oral BID  . fentaNYL  50 mcg Transdermal Q72H  . guaiFENesin  600 mg Oral BID  . ipratropium-albuterol  3 mL Nebulization QID  . megestrol  400 mg Oral Daily  . mineral oil  1 enema Rectal Once  . multivitamin with minerals  1 tablet Oral Daily  . polyethylene glycol  17 g Oral BID  . senna-docusate  1 tablet Oral BID  . tamsulosin  0.4 mg Oral QPC supper  . traZODone  50 mg Oral QHS   Continuous Infusions: . sodium chloride 100 mL/hr at 05/28/15 0559   PRN Meds:.bisacodyl, HYDROmorphone (DILAUDID) injection, ipratropium-albuterol, ondansetron **OR** ondansetron (ZOFRAN) IV, ondansetron (ZOFRAN) IV, oxyCODONE, zolpidem Medications Prior to Admission:  Prior to Admission medications   Medication Sig Start Date End Date Taking? Authorizing Provider  Ascorbic Acid (VITAMIN C PO) Take 1 tablet by mouth daily.   Yes Historical Provider, MD  B Complex-Biotin-FA (B-COMPLEX PO) Take 1 tablet by mouth daily.   Yes Historical Provider, MD  Cholecalciferol (VITAMIN D PO) Take 1 tablet by mouth daily.   Yes Historical Provider, MD  megestrol (MEGACE ES) 625 MG/5ML suspension Take 5 mLs (625 mg total) by mouth daily. 05/02/15  Yes Curt Bears, MD  Multiple Vitamin (MULTIVITAMIN WITH MINERALS) TABS tablet Take 1 tablet by mouth daily.   Yes Historical Provider, MD  oxyCODONE (ROXICODONE) 15 MG immediate release tablet Take 1 tablet (15 mg total) by mouth every 4 (four) hours as needed for pain. 05/17/15  Yes Gery Pray, MD  polyethylene glycol [facility] / GLYCOLAX) packet Take 17 g by mouth 2 (two) times daily.   Yes Historical Provider, MD  tamsulosin (FLOMAX) 0.4 MG CAPS capsule Take 0.4 mg by mouth daily after supper.   Yes Historical Provider, MD  traZODone (DESYREL) 50 MG tablet Take 1 tablet (50 mg total) by mouth at bedtime as needed and may repeat dose one time if needed for sleep. Patient taking differently: Take 25-50 mg by mouth at bedtime as needed and may repeat dose  one time if needed for sleep.  11/11/12  Yes Niel Hummer, NP  levofloxacin (LEVAQUIN) 750 MG tablet Take 1 tablet (750 mg total) by mouth daily. Patient not taking: Reported on 05/23/2015 05/12/15   Erline Hau, MD   No Known Allergies  Review of Systems Weakness in RUE and bilateral LE  Physical Exam Thin gentleman NAD Clear S1 S2 Abdomen soft No edema Bilateral LE weakness and RUE weakness RUE 3/5 RLE 3/5 LLE 3/5  Vital Signs: BP 104/67 mmHg  Pulse 69  Temp(Src) 98.2 F (36.8 C) (Oral)  Resp 16  Ht 6' (1.829 m)  Wt 63.1 kg (139 lb 1.8 oz)  BMI 18.86 kg/m2  SpO2 100%  SpO2: SpO2: 100 % O2 Device:SpO2: 100 % O2 Flow Rate: .O2 Flow Rate (L/min): 2 L/min  IO: Intake/output summary:  Intake/Output Summary (Last 24 hours) at 05/28/15 1138 Last data filed at 05/28/15 0600  Gross per 24 hour  Intake   1047 ml  Output   1250 ml  Net   -203 ml    LBM: Last BM Date: 05/22/15 (on laxatives) Baseline Weight: Weight: 63.1 kg (139 lb 1.8 oz) Most recent weight: Weight: 63.1 kg (139 lb 1.8 oz)      Palliative Assessment/Data:  Flowsheet Rows        Most Recent Value   Intake Tab    Referral Department  Hospitalist   Unit at Time of Referral  Med/Surg Unit   Palliative Care Primary Diagnosis  Cancer   Palliative Care Type  New Palliative care   Reason for referral  Pain, Non-pain Symptom, Clarify Goals of Care   Clinical Assessment    Palliative Performance Scale Score  40%   Pain Max last 24 hours  6   Pain Min Last 24 hours  5   Dyspnea Max Last 24 Hours  3   Dyspnea Min Last 24 hours  2   Psychosocial & Spiritual Assessment    Palliative Care Outcomes    Patient/Family meeting held?  No   Palliative Care follow-up planned  Yes, Facility      Additional Data Reviewed:  CBC:    Component Value Date/Time   WBC 8.4 05/28/2015 0506   WBC 5.7 05/15/2015 1411   HGB 9.9* 05/28/2015 0506   HGB 11.2* 05/15/2015 1411   HCT 30.2* 05/28/2015 0506    HCT 35.1* 05/15/2015 1411   PLT 181 05/28/2015 0506   PLT 240 05/15/2015 1411   MCV 86.5 05/28/2015 0506   MCV 88.6 05/15/2015 1411   NEUTROABS 2.5 05/26/2015 0435   NEUTROABS 3.4 05/15/2015 1411   LYMPHSABS 2.0 05/26/2015 0435   LYMPHSABS 1.7 05/15/2015 1411   MONOABS 0.4 05/26/2015 0435   MONOABS 0.3 05/15/2015 1411   EOSABS 0.2 05/26/2015 0435   EOSABS 0.3 05/15/2015 1411   BASOSABS 0.0 05/26/2015 0435   BASOSABS 0.0 05/15/2015 1411   Comprehensive Metabolic Panel:    Component Value Date/Time   NA 137 05/28/2015 0506   NA 132* 05/15/2015 1411   K 4.0 05/28/2015 0506   K 4.0 05/15/2015 1411   CL 107 05/28/2015 0506   CO2 23 05/28/2015 0506   CO2 20* 05/15/2015 1411   BUN 11 05/28/2015 0506   BUN 12.5 05/15/2015 1411   CREATININE 0.45* 05/28/2015 0506   CREATININE 0.8 05/15/2015 1411   GLUCOSE 119* 05/28/2015 0506   GLUCOSE 84 05/15/2015 1411   CALCIUM 8.8* 05/28/2015 0506   CALCIUM 9.3 05/15/2015 1411   AST 13* 05/26/2015 0435   AST 26 05/15/2015 1411   ALT 16* 05/26/2015 0435   ALT 58* 05/15/2015 1411   ALKPHOS 66 05/26/2015 0435   ALKPHOS 78 05/15/2015 1411   BILITOT 0.6 05/26/2015 0435   BILITOT <0.30 05/15/2015 1411   PROT 6.0* 05/26/2015 0435   PROT 7.2 05/15/2015 1411   ALBUMIN 2.8* 05/26/2015 0435   ALBUMIN 2.7* 05/15/2015 1411     Time In: 10 Time Out: 11 Time Total: 60 min  Greater than 50%  of this time was spent counseling and coordinating care related to the above assessment and plan.  Signed by: Loistine Chance, MD [phone] Loistine Chance, MD  05/28/2015, 11:38 AM  Please contact Palliative Medicine Team phone at [phone] for questions and concerns.

## 2015-05-29 ENCOUNTER — Inpatient Hospital Stay (HOSPITAL_COMMUNITY): Payer: Medicaid Other

## 2015-05-29 DIAGNOSIS — R079 Chest pain, unspecified: Secondary | ICD-10-CM

## 2015-05-29 DIAGNOSIS — M5125 Other intervertebral disc displacement, thoracolumbar region: Secondary | ICD-10-CM

## 2015-05-29 DIAGNOSIS — G839 Paralytic syndrome, unspecified: Secondary | ICD-10-CM

## 2015-05-29 LAB — BASIC METABOLIC PANEL
ANION GAP: 6 (ref 5–15)
BUN: 12 mg/dL (ref 6–20)
CO2: 25 mmol/L (ref 22–32)
Calcium: 8.8 mg/dL — ABNORMAL LOW (ref 8.9–10.3)
Chloride: 106 mmol/L (ref 101–111)
Creatinine, Ser: 0.52 mg/dL — ABNORMAL LOW (ref 0.61–1.24)
GFR calc Af Amer: >60 mL/min (ref >60)
GLUCOSE: 97 mg/dL (ref 65–99)
POTASSIUM: 4.2 mmol/L (ref 3.5–5.1)
Sodium: 137 mmol/L (ref 135–145)

## 2015-05-29 LAB — CBC
HCT: 31.8 % — ABNORMAL LOW (ref 39.0–52.0)
HEMOGLOBIN: 10.3 g/dL — AB (ref 13.0–17.0)
MCH: 28.4 pg (ref 26.0–34.0)
MCHC: 32.4 g/dL (ref 30.0–36.0)
MCV: 87.6 fL (ref 78.0–100.0)
PLATELETS: 174 10*3/uL (ref 150–400)
RBC: 3.63 MIL/uL — AB (ref 4.22–5.81)
RDW: 14.9 % (ref 11.5–15.5)
WBC: 7.5 10*3/uL (ref 4.0–10.5)

## 2015-05-29 MED ORDER — DULOXETINE HCL 20 MG PO CPEP
20.0000 mg | ORAL_CAPSULE | Freq: Every day | ORAL | Status: DC
  Administered 2015-05-29 – 2015-05-30 (×2): 20 mg via ORAL
  Filled 2015-05-29 (×2): qty 1

## 2015-05-29 MED ORDER — SODIUM CHLORIDE 0.9 % IV BOLUS (SEPSIS)
1000.0000 mL | Freq: Once | INTRAVENOUS | Status: AC
  Administered 2015-05-29: 1000 mL via INTRAVENOUS

## 2015-05-29 MED ORDER — ENSURE ENLIVE PO LIQD
237.0000 mL | Freq: Two times a day (BID) | ORAL | Status: DC
  Administered 2015-05-29 (×2): 237 mL via ORAL

## 2015-05-29 MED ORDER — MINERAL OIL RE ENEM
1.0000 | ENEMA | Freq: Once | RECTAL | Status: DC
  Filled 2015-05-29: qty 1

## 2015-05-29 NOTE — Progress Notes (Signed)
CSW left a message with Lura Em for assistance with emergency medicaid to cover hospital costs.   CSW shared information and requested Ms. Avery to see Pt if possible.  Pete Pelt [facility]  [phone]

## 2015-05-29 NOTE — Progress Notes (Signed)
Daily Progress Note   Patient Name: Barry Cook       Date: 05/29/2015 DOB: 1955-11-07  Age: 60 y.o. MRN#: [id number] Attending Physician: Florencia Reasons, MD Primary Care Physician: No PCP Per Patient Admit Date: 05/25/2015  Reason for Consultation/Follow-up: Disposition, Establishing goals of care and Non pain symptom management  Subjective: "I can't stand this tingling feeling in my right arm and legs.  I don't want to be alive feeling like this"  Patient reports he had a difficult night partially due to bowel incontinence after an enema.  However he requests another enema to help "clean the rest of it out".   Interval Events: 60 yo gentleman with history of lung cancer, patient of Dr Earlie Server, undergoing immunotherapy. 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 Patient with progressive bilateral LE weakness and pain. He was admitted to hospitalist service. He underwent MRI showing no cord compression but showing increased metastatic disease to spine.   Patient and family requesting hospice services.  He understands that current therapies being offered are not curative, and he desires relief from his current symptoms of numbness / burning / tingling in his extremities.  Wife and patient clearly stated that the patient wants to be at home rather than at SNF or residential hospice facility.    He requests [facility].  He is a DNR/DNI and requests "make me comfortable".    Length of Stay: 2 days  Current Medications: Scheduled Meds:  . DULoxetine  20 mg Oral Daily  . enoxaparin (LOVENOX) injection  40 mg Subcutaneous QHS  . famotidine  20 mg Oral BID  . feeding supplement (ENSURE ENLIVE)  237 mL Oral BID BM  . fentaNYL  50 mcg Transdermal Q72H  .  guaiFENesin  600 mg Oral BID  . ipratropium-albuterol  3 mL Nebulization QID  . megestrol  400 mg Oral Daily  . mineral oil  1 enema Rectal Once  . mineral oil  1 enema Rectal Once  . multivitamin with minerals  1 tablet Oral Daily  . senna-docusate  1 tablet Oral BID  . tamsulosin  0.4 mg Oral QPC supper  . traZODone  50 mg Oral QHS    Continuous Infusions: . sodium chloride 100 mL/hr at 05/29/15 0408    PRN Meds: bisacodyl,  HYDROmorphone (DILAUDID) injection, ipratropium-albuterol, LORazepam, ondansetron **OR** ondansetron (ZOFRAN) IV, ondansetron (ZOFRAN) IV, oxyCODONE, zolpidem  Physical Exam: Physical Exam              Vital Signs: BP 100/63 mmHg  Pulse 110  Temp(Src) 99.3 F (37.4 C) (Oral)  Resp 18  Ht 6' (1.829 m)  Wt 63.1 kg (139 lb 1.8 oz)  BMI 18.86 kg/m2  SpO2 99% SpO2: SpO2: 99 % O2 Device: O2 Device: Not Delivered O2 Flow Rate: O2 Flow Rate (L/min): 2 L/min  Intake/output summary:  Intake/Output Summary (Last 24 hours) at 05/29/15 1411 Last data filed at 05/29/15 1300  Gross per 24 hour  Intake    960 ml  Output   2800 ml  Net  -1840 ml   LBM: Last BM Date: 05/22/15 (on laxatives) Baseline Weight: Weight: 63.1 kg (139 lb 1.8 oz) Most recent weight: Weight: 63.1 kg (139 lb 1.8 oz)       Palliative Assessment/Data: Flowsheet Rows        Most Recent Value   Intake Tab    Referral Department  Hospitalist   Unit at Time of Referral  Med/Surg Unit   Palliative Care Primary Diagnosis  Cancer   Palliative Care Type  New Palliative care   Reason for referral  Pain, Non-pain Symptom, Clarify Goals of Care   Clinical Assessment    Palliative Performance Scale Score  20%   Pain Max last 24 hours  6   Pain Min Last 24 hours  4   Dyspnea Max Last 24 Hours  3   Dyspnea Min Last 24 hours  2   Anxiety Max Last 24 Hours  6   Anxiety Min Last 24 Hours  5   Psychosocial & Spiritual Assessment    Social Work Plan of Care  Clarified patient/family wishes with  healthcare team   Palliative Care Outcomes    Patient/Family meeting held?  Yes   Who was at the meeting?  Wife, in laws, neice, RN   Palliative Care Outcomes  Counseled regarding hospice, Clarified goals of care, Improved non-pain symptom therapy   Palliative Care follow-up planned  Yes, Facility      Additional Data Reviewed: CBC    Component Value Date/Time   WBC 7.5 05/29/2015 0745   WBC 5.7 05/15/2015 1411   RBC 3.63* 05/29/2015 0745   RBC 3.96* 05/15/2015 1411   RBC 3.07* 05/09/2015 0145   HGB 10.3* 05/29/2015 0745   HGB 11.2* 05/15/2015 1411   HCT 31.8* 05/29/2015 0745   HCT 35.1* 05/15/2015 1411   PLT 174 05/29/2015 0745   PLT 240 05/15/2015 1411   MCV 87.6 05/29/2015 0745   MCV 88.6 05/15/2015 1411   MCH 28.4 05/29/2015 0745   MCH 28.3 05/15/2015 1411   MCHC 32.4 05/29/2015 0745   MCHC 31.9* 05/15/2015 1411   RDW 14.9 05/29/2015 0745   RDW 13.6 05/15/2015 1411   LYMPHSABS 2.0 05/26/2015 0435   LYMPHSABS 1.7 05/15/2015 1411   MONOABS 0.4 05/26/2015 0435   MONOABS 0.3 05/15/2015 1411   EOSABS 0.2 05/26/2015 0435   EOSABS 0.3 05/15/2015 1411   BASOSABS 0.0 05/26/2015 0435   BASOSABS 0.0 05/15/2015 1411    CMP     Component Value Date/Time   NA 137 05/29/2015 0745   NA 132* 05/15/2015 1411   K 4.2 05/29/2015 0745   K 4.0 05/15/2015 1411   CL 106 05/29/2015 0745   CO2 25 05/29/2015 0745   CO2 20* 05/15/2015  1411   GLUCOSE 97 05/29/2015 0745   GLUCOSE 84 05/15/2015 1411   BUN 12 05/29/2015 0745   BUN 12.5 05/15/2015 1411   CREATININE 0.52* 05/29/2015 0745   CREATININE 0.8 05/15/2015 1411   CALCIUM 8.8* 05/29/2015 0745   CALCIUM 9.3 05/15/2015 1411   PROT 6.0* 05/26/2015 0435   PROT 7.2 05/15/2015 1411   ALBUMIN 2.8* 05/26/2015 0435   ALBUMIN 2.7* 05/15/2015 1411   AST 13* 05/26/2015 0435   AST 26 05/15/2015 1411   ALT 16* 05/26/2015 0435   ALT 58* 05/15/2015 1411   ALKPHOS 66 05/26/2015 0435   ALKPHOS 78 05/15/2015 1411   BILITOT 0.6 05/26/2015  0435   BILITOT <0.30 05/15/2015 1411   GFRNONAA >60 05/29/2015 0745   GFRAA >60 05/29/2015 0745       Problem List:  Patient Active Problem List   Diagnosis Date Noted  . Encounter for palliative care   . 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 SOB (shortness of breath)   . Back pain 05/27/2015  . Metastatic cancer ([place])   . Weakness 05/25/2015  . Intractable back pain 05/25/2015  . COPD (chronic obstructive pulmonary disease) ([place]) 05/25/2015  . 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 Postobstructive pneumonia 05/12/2015  . Chest pain 05/09/2015  . Anemia 05/09/2015  . Lung cancer, lower lobe ([place]) 04/17/2015  . Cancer ([place])   . 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 Neoplasm related pain 03/27/2015  . Constipation 03/27/2015  . Lung mass 03/08/2015     Palliative Care Assessment & Plan    1.Code Status:  DNR    Code Status Orders        Start     Ordered   05/29/15 1408  Do not attempt resuscitation (DNR)   Continuous    Question Answer Comment  In the event of cardiac or respiratory ARREST Do not call a "code blue"   In the event of cardiac or respiratory ARREST Do not perform Intubation, CPR, defibrillation or ACLS   In the event of cardiac or respiratory ARREST Use medication by any route, position, wound care, and other measures to relive pain and suffering. May use oxygen, suction and manual treatment of airway obstruction as needed for comfort.      05/29/15 1408       2. Goals of Care/Additional Recommendations:  DNR  Limitations on Scope of Treatment: Full Comfort Care  Desire for further Chaplaincy support: No  Psycho-social Needs: Caregiving  Support/Resources  3. Symptom Management:      Added Cymbalta low dose for neuropathic pain and anxiety.  Dose to be titrated up by [facility].       Repeat Mineral oil enema today.  Patient will need  a bowel regimen.  4. Palliative Prophylaxis:   Bowel Regimen and Frequent Pain Assessment  5. Prognosis: weeks to months.  Less than 6 months.  6. Discharge Planning:  Home with Hospice   Care plan was discussed with SW, RN, Wife, Patient and extended family.  Thank you for allowing the Palliative Medicine Team to assist in the care of this patient.   Time In:  1345 Time Out: 1420 Total Time 35 min Prolonged Time Billed no        Melton Alar, PA-C  05/29/2015, 2:11 PM  Please contact Palliative Medicine Team phone at [phone] for questions and concerns.

## 2015-05-29 NOTE — Progress Notes (Signed)
Nutrition Brief Note  Based on NFPE findings, pt is severely malnourished. Pt has lost 29 lb since 11/09 (17% weight loss x 2 months).  Chart reviewed. Pt now transitioning to comfort care.  No further nutrition interventions warranted at this time.  Please re-consult as needed.   Clayton Bibles, MS, RD, LDN Pager: [phone] After Hours Pager: [phone]

## 2015-05-29 NOTE — Progress Notes (Signed)
  Echocardiogram 2D Echocardiogram has been performed.  Diamond Nickel 05/29/2015, 3:28 PM

## 2015-05-29 NOTE — Progress Notes (Signed)
CSW was following Pt for possible assistance with placement. Pt and family spoke with Palliative services and Pt would like to go home with [facility].   CSW shared information with Unit covering [facility].   CSW spoke with family briefly to inform them also of CM assisting with Hospice services.   CSW signing off.    Pete Pelt [facility]  [phone]

## 2015-05-29 NOTE — Progress Notes (Signed)
PROGRESS NOTE  Barry Cook EQA:[id number] DOB: Jul 04, 1955 DOA: 05/25/2015 PCP: No PCP Per Patient  HPI/Recap of past 24 hours: Reports slept better, having bm, breathing better, still have breakthrough pain, not able to lift bilateral lower leg against gravity, able lift arms above head, does c/o numbness in all extremities. Wife not in room this am.   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  Patient underwent mri under anesthesia, on 12/30, mri entire spine did showed multiple mets but no overt cord compression. Ct head no acute findings, Decadron d/ced. Bilateral lower extremity weakness unexplained, ? 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 (neuropathy/myopathy),  Ck wnl, rad onc recommended CT ab/pel  Which was done on 12/27.  Impatient rehab was consulted on admission, patient and family also want to talk to palliative care to explore options moving forward in the setting of widely metastatic lung cancer.    Copd: less  wheezing, continue schedule nebs, no fever, mild cough  cxr with known large right lower lobe lung mass with central cavitation and surrounding opacities. Superimposed pneumonia remains a concern.  Consider abx if not improving or develop fever. So  far, no fever, no leukocytosis, not on abx.  Chest pain: echo pending, likely from lung ca  cancer pain:  started ms contin due to frequent breakthrough pain, continue titrate  Urinary retention: on flomax, foley in until see urology in a week  Constipation: no bm for a week prior to admission. Started have bm on 1/2  Insomnia: patient requested trazadon '50mg'$  po qhs at 9pm. Reported traadon not helping, Want to try something else,start ambien qhs prn.  Metastatic lung ca on immunotherapy, oncology consulted    Code Status: full  Family Communication: patient   Disposition Plan: pending   Consultants:  Oncology (dr Julien Nordmann)  Radiation oncology (dr Tammi Klippel)  Neurosurgery (Dr. [name])  Anesthesiology  Palliative care  Procedures:  Mri under anesthesia on 12/31  Antibiotics:  none   Objective: BP 100/54 mmHg  Pulse 69  Temp(Src) 98.4 F (36.9 C) (Oral)  Resp 18  Ht 6' (1.829 m)  Wt 139 lb 1.8 oz (63.1 kg)  BMI 18.86 kg/m2  SpO2 92%  Intake/Output Summary (Last 24 hours) at 05/29/15 0912 Last data filed at 05/28/15 1738  Gross per 24 hour  Intake    720 ml  Output   1000 ml  Net   -280 ml   Filed Weights   05/25/15 2115  Weight: 139 lb 1.8 oz (63.1 kg)    Exam:   General:  NAD, frail, thin  Cardiovascular: RRR  Respiratory: improved aeration, less wheezing bilaterally  Abdomen: Soft/ND/NT, positive BS  Musculoskeletal: No Edema  Neuro: not able to lift legs against gravity, sensation intact. aaox3  Data Reviewed: Basic Metabolic Panel:  Recent Labs Lab 05/25/15 1300  05/26/15 0435 05/27/15 0450 05/28/15 0506 05/29/15 0745  NA 134* 138 135 137 137  K 4.5 4.3 4.2 4.0 4.2  CL 102 105 105 107 106  CO2 '24 26 22 23 25  '$ GLUCOSE 97 102* 141* 119* 97  BUN '16 14 11 11 12  '$ CREATININE 0.67 0.76 0.67 0.45* 0.52*  CALCIUM 9.4 8.8* 9.1 8.8* 8.8*  MG  --   --  1.9 1.9  --    Liver Function Tests:  Recent Labs Lab 05/25/15 1300  05/26/15 0435  AST 18 13*  ALT 18 16*  ALKPHOS 73 66  BILITOT 0.5 0.6  PROT 6.8 6.0*  ALBUMIN 3.2* 2.8*   No results for input(s): LIPASE, AMYLASE in the last 168 hours. No results for input(s): AMMONIA in the last 168 hours. CBC:  Recent Labs Lab 05/23/15 1533 05/25/15 1300 05/26/15 0435 05/27/15 0450 05/28/15 0506 05/29/15 0745  WBC 4.8 6.6 5.0 5.3 8.4 7.5  NEUTROABS 2.2 3.2 2.5  --   --   --   HGB 10.5* 11.7* 10.7* 11.1* 9.9* 10.3*  HCT 31.2* 36.7* 33.2* 33.6* 30.2* 31.8*  MCV 86.9 87.6 88.5 86.4 86.5 87.6  PLT 225 200 196 196 181 174   Cardiac Enzymes:    Recent Labs Lab 05/28/15 0506  CKTOTAL 59   BNP (last 3 results) No results for input(s): BNP in the last 8760 hours.  ProBNP (last 3 results) No results for input(s): PROBNP in the last 8760 hours.  CBG: No results for input(s): GLUCAP in the last 168 hours.  Recent Results (from the past 240 hour(s))  Urine culture     Status: None   Collection Time: 05/23/15  2:10 PM  Result Value Ref Range Status   Specimen Description URINE, CLEAN CATCH  Final   Special Requests NONE  Final   Culture   Final    NO GROWTH 1 DAY Performed at [facility]    Report Status 05/24/2015 FINAL  Final  MRSA PCR Screening     Status: None   Collection Time: 05/27/15  5:38 PM  Result Value Ref Range Status   MRSA by PCR NEGATIVE NEGATIVE Final    Comment:        The GeneXpert MRSA Assay (FDA approved for NASAL specimens only), is one component of a comprehensive MRSA colonization surveillance program. It is not intended to diagnose MRSA infection nor to guide or monitor treatment for MRSA infections.      Studies: No results found.  Scheduled Meds: . enoxaparin (LOVENOX) injection  40 mg Subcutaneous QHS  . famotidine  20 mg Oral BID  . feeding supplement (ENSURE ENLIVE)  237 mL Oral BID BM  . fentaNYL  50 mcg Transdermal Q72H  . guaiFENesin  600 mg Oral BID  . ipratropium-albuterol  3 mL  Nebulization QID  . megestrol  400 mg Oral Daily  . mineral oil  1 enema Rectal Once  . multivitamin with minerals  1 tablet Oral Daily  . senna-docusate  1 tablet Oral BID  . sodium chloride  1,000 mL Intravenous Once  . tamsulosin  0.4 mg Oral QPC supper  . traZODone  50 mg Oral QHS    Continuous Infusions: . sodium chloride 100 mL/hr at 05/29/15 0408     Time spent: 49mns  Myron Lona MD, PhD  [facility] Pager [phone] If 7PM-7AM, please contact night-coverage at [email or web address], password [email or web address]1/06/2015, 9:12 AM  LOS: 2 days

## 2015-05-29 NOTE — Care Management Note (Signed)
Case Management Note  Patient Details  Name: Barry Cook MRN: [id number] Date of Birth: [birth year]  Subjective/Objective:  Noted may consider home w/hospice. Will await CM referral.                   Action/Plan:d/c plan home.   Expected Discharge Date:   (unknown)               Expected Discharge Plan:  Home w Hospice Care  In-House Referral:     Discharge planning Services  CM Consult  Post Acute Care Choice:    Choice offered to:     DME Arranged:    DME Agency:     HH Arranged:    HH Agency:     Status of Service:  In process, will continue to follow  Medicare Important Message Given:    Date Medicare IM Given:    Medicare IM give by:    Date Additional Medicare IM Given:    Additional Medicare Important Message give by:     If discussed at [place] of Stay Meetings, dates discussed:    Additional Comments:  Dessa Phi, RN 05/29/2015, 2:11 PM

## 2015-05-29 NOTE — Progress Notes (Addendum)
Pt sustained fall from bed.  Gavin Pound provider notified and new orders obtained.  Family (Wife) present in room asleep during fall.

## 2015-05-29 NOTE — Progress Notes (Signed)
Physical Therapy Treatment Patient Details Name: Barry Cook MRN: [id number] DOB: 12-24-1955 Today's Date: 05/29/2015    History of Present Illness 60 yo male admitted with intractable back pain, MRI + mets L1, S1 and disc herniation T12, L1. Hx oflung cancer with cervical spinal mets, COPD    PT Comments    Wife present this session. Discussed d/c plan-wife states they are considering home with hospice. Pt/family could possibly benefit from short stay at CIR to maximize safety and independence with functional mobility and for pt/family education, if possible and if they are agreeable.  Follow Up Recommendations  CIR;Supervision/Assistance - 24 hour (family may decide to take pt home with hospice)     Equipment Recommendations  Wheelchair;Wheelchair cushion ;Hospital bed    Recommendations for Other Services Rehab consult;OT consult     Precautions / Restrictions Precautions Precautions: Fall Restrictions Weight Bearing Restrictions: No    Mobility  Bed Mobility Overal bed mobility: Needs Assistance Bed Mobility: Supine to Sit;Sit to Supine     Supine to sit: Min guard Sit to supine: Min assist   General bed mobility comments: Increased time and effort. Assist for LEs mostly.   Transfers Overall transfer level: Needs assistance   Transfers: Squat Pivot Transfers     Squat pivot transfers: Min guard/Min assist     General transfer comment: Therapist assisted with setup (wheelchair postioning, armrest removal, LE positioning prior to transfer). Mostly close guard for safety. Transfer x2 (once towards L, once towards R).   Ambulation/Gait             General Gait Details: NT-pt is unable    Science writer    Modified Rankin (Stroke Patients Only)       Balance                                    Cognition Arousal/Alertness: Awake/alert Behavior During Therapy: WFL for tasks  assessed/performed Overall Cognitive Status: Within Functional Limits for tasks assessed                      Exercises General Exercises - Lower Extremity Heel Slides: AAROM;Both;Supine (7 reps) Hip ABduction/ADduction: AAROM;Both;Supine (7 reps)    General Comments        Pertinent Vitals/Pain Pain Assessment: 0-10 Pain Location: UEs, bil lower legs, neck, bil shoulders Pain Intervention(s): RN gave pain meds during session;Repositioned    Home Living                      Prior Function            PT Goals (current goals can now be found in the care plan section) Progress towards PT goals: Progressing toward goals    Frequency  Min 3X/week    PT Plan Current plan remains appropriate    Co-evaluation             End of Session Equipment Utilized During Treatment: Gait belt Activity Tolerance: Patient tolerated treatment well Patient left: in bed;with call bell/phone within reach;with family/visitor present     Time: 3299-2426 PT Time Calculation (min) (ACUTE ONLY): 17 min  Charges:  $Therapeutic Activity: 8-22 mins                    G Codes:      Ozella Almond  Starleen Blue, MPT Pager: [phone]

## 2015-05-30 ENCOUNTER — Encounter (HOSPITAL_COMMUNITY): Payer: Self-pay | Admitting: Radiology

## 2015-05-30 MED ORDER — OXYCODONE HCL 15 MG PO TABS: 15.0000 mg | ORAL_TABLET | ORAL | Status: DC | PRN

## 2015-05-30 MED ORDER — PANTOPRAZOLE SODIUM 40 MG PO TBEC: 40.0000 mg | DELAYED_RELEASE_TABLET | Freq: Every day | ORAL | Status: DC

## 2015-05-30 MED ORDER — ENSURE ENLIVE PO LIQD: 237.0000 mL | Freq: Two times a day (BID) | ORAL | Status: DC

## 2015-05-30 MED ORDER — FENTANYL 50 MCG/HR TD PT72: 50.0000 ug | MEDICATED_PATCH | TRANSDERMAL | Status: DC

## 2015-05-30 MED ORDER — GABAPENTIN 300 MG PO CAPS: 300.0000 mg | ORAL_CAPSULE | Freq: Three times a day (TID) | ORAL | Status: DC

## 2015-05-30 MED ORDER — POLYETHYLENE GLYCOL 3350 17 G PO PACK: 17.0000 g | PACK | Freq: Every day | ORAL | Status: AC | PRN

## 2015-05-30 MED ORDER — SODIUM CHLORIDE 0.9 % IV BOLUS (SEPSIS)
1000.0000 mL | Freq: Once | INTRAVENOUS | Status: AC
  Administered 2015-05-30: 1000 mL via INTRAVENOUS

## 2015-05-30 MED ORDER — LORAZEPAM 1 MG PO TABS: 1.0000 mg | ORAL_TABLET | Freq: Three times a day (TID) | ORAL | Status: AC

## 2015-05-30 MED ORDER — DULOXETINE HCL 20 MG PO CPEP: 20.0000 mg | ORAL_CAPSULE | Freq: Every day | ORAL | Status: DC

## 2015-05-30 MED ORDER — PANTOPRAZOLE SODIUM 40 MG PO TBEC
40.0000 mg | DELAYED_RELEASE_TABLET | Freq: Every day | ORAL | Status: DC
  Administered 2015-05-30: 40 mg via ORAL
  Filled 2015-05-30: qty 1

## 2015-05-30 MED ORDER — CHLORPROMAZINE HCL 25 MG/ML IJ SOLN
25.0000 mg | Freq: Once | INTRAMUSCULAR | Status: AC
  Administered 2015-05-30: 25 mg via INTRAMUSCULAR
  Filled 2015-05-30 (×2): qty 1

## 2015-05-30 MED ORDER — FAMOTIDINE 20 MG PO TABS: 20.0000 mg | ORAL_TABLET | Freq: Two times a day (BID) | ORAL | Status: DC

## 2015-05-30 MED ORDER — ZOLPIDEM TARTRATE 5 MG PO TABS: 5.0000 mg | ORAL_TABLET | Freq: Every evening | ORAL | Status: DC | PRN

## 2015-05-30 NOTE — Discharge Summary (Signed)
Discharge Summary  Barry Cook QZR:[id number] DOB: February 05, 1956  PCP: No PCP Per Patient  Admit date: 05/25/2015 Discharge date: 05/30/2015  Time spent: >11mns  Recommendations for Outpatient Follow-up:  1. F/u with PMD as needed 2. Patient is to discharged to home with home hospice  Discharge Diagnoses:  Active Hospital Problems   Diagnosis Date Noted  . Intractable back pain 05/25/2015  . Encounter for palliative care   . Paralysis ([facility]   . SOB (shortness of breath)   . Back pain 05/27/2015  . Metastatic cancer ([facility]   . Weakness 05/25/2015  . COPD (chronic obstructive pulmonary disease) ([facility] 05/25/2015  . 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 Anemia 05/09/2015  . Lung cancer, lower lobe ([facility] 04/17/2015  . 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    Discharge Condition: stable  Diet recommendation: diet as tolerated  Filed Weights   05/25/15 2115  Weight: 139 lb 1.8 oz (63.1 kg)    History of present illness:  Barry JACKSONis a 60y.o. 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 He notices that in the last 2 days that his lower extremities weakness has worsened to the point that he is unable to ambulate without assistance. He reports constipation of several days and [facility] to a 5 day long gradual onset urinary retention. The patient currently has a Foley catheter.   Per patient's wife and Mr. KAmison he was diagnosed on 03/07/2015 with lung cancer after MRI, which was ordered due to bilateral hand tingling,  showed an metastatic lesions on C6. He received palliative radiation for this lesion and denies further worsening.   When seen in the emergency department, the patient was sleeping and hypotensive due to receiving Ativan prior to MRI scanning of his lumbosacral spine. The patient subsequently woke up and was able to provide some history. Blood pressure improved with 1 L normal saline bolus. He is currently in no acute distress.  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  Patient underwent mri under anesthesia, on 12/30, mri entire spine did showed multiple mets but no overt cord compression. Ct head no acute findings, Decadron d/ced. Bilateral lower extremity weakness unexplained, ? 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 (neuropathy/myopathy), Ck wnl, rad onc recommended CT ab/pel Which was done on 12/27.  patient and family talked to palliative care team , patient and family wished to  go home with home hospice. His main concern is the numbness and tingling, neurontin started.   Copd:  less wheezing, he received nebs, no fever, mild cough cxr with known large right lower lobe lung mass with central cavitation and surrounding opacities. Superimposed pneumonia remains a  concern. Consider abx if not improving or develop fever. So far, no fever, no leukocytosis, not on abx.  Chest pain: echo unremakable, likely from lung ca  cancer pain: started ms contin due to frequent breakthrough pain, continue titrate   Urinary retention: on flomax, foley in until see urology in a week  Constipation: no bm for a week prior to admission. Started have bm on 1/2  Insomnia: patient requested trazadon '50mg'$  po qhs at 9pm. Reported traadon not helping, Want to try something else,start ambien qhs prn.  Metastatic lung ca on immunotherapy, oncology consulted, now go home with home hospice    Code Status: full  Family Communication: patient and wife  Disposition Plan: home with home hospice   Consultants:  Oncology (dr Julien Nordmann)  Radiation oncology (dr Tammi Klippel)  Neurosurgery (Dr. [name])  Anesthesiology  Palliative care  Procedures:  Mri under anesthesia on 12/31  Antibiotics:  none   Discharge Exam: BP 89/62 mmHg  Pulse 120  Temp(Src) 99 F (37.2 C) (Oral)  Resp 16  Ht 6' (1.829 m)  Wt 139 lb 1.8 oz (63.1 kg)  BMI 18.86 kg/m2  SpO2 98%   General: NAD, frail, thin  Cardiovascular: RRR  Respiratory: improved aeration, less wheezing bilaterally  Abdomen: Soft/ND/NT, positive BS  Musculoskeletal: No Edema  Neuro: not able to lift legs against gravity, sensation intact. [id number]   Discharge Instructions You were cared for by a hospitalist during your hospital stay. If you have any questions about your discharge medications or the care you received while you were in the hospital after you are discharged, you can call the unit and asked to speak with the hospitalist on call if the hospitalist that took care of you is not available. Once you are discharged, your primary care physician will handle any further medical issues. Please note that NO REFILLS for any discharge medications will be authorized once you are discharged, as it is imperative  that you return to your primary care physician (or establish a relationship with a primary care physician if you do not have one) for your aftercare needs so that they can reassess your need for medications and monitor your lab values.  Discharge Instructions    Diet - low sodium heart healthy    Complete by:  As directed      Increase activity slowly    Complete by:  As directed             Medication List    STOP taking these medications        levofloxacin 750 MG tablet  Commonly known as:  LEVAQUIN      TAKE these medications        B-COMPLEX PO  Take 1 tablet by mouth daily.     DULoxetine 20 MG capsule  Commonly known as:  CYMBALTA  Take 1 capsule (20 mg total) by mouth daily.     famotidine 20 MG tablet  Commonly known as:  PEPCID  Take 1 tablet (20 mg total) by mouth 2 (two) times daily.     feeding supplement (ENSURE ENLIVE) Liqd  Take 237 mLs by mouth 2 (two) times daily between meals.     fentaNYL 50 MCG/HR  Commonly known as:  [place] - dosed mcg/hr  Place 1 patch (50 mcg total) onto the skin every 3 (three) days.     gabapentin 300 MG capsule  Commonly known as:  NEURONTIN  Take 1 capsule (300 mg total) by mouth 3 (three) times daily.     LORazepam 1 MG tablet  Commonly known as:  ATIVAN  Take 1 tablet (1 mg total) by mouth every 8 (eight) hours.     megestrol 625 MG/5ML suspension  Commonly known as:  MEGACE ES  Take 5 mLs (625 mg total) by mouth daily.     multivitamin with minerals Tabs tablet  Take 1 tablet by mouth daily.     oxyCODONE 15 MG immediate release tablet  Commonly known as:  ROXICODONE  Take 1 tablet (15 mg total) by mouth every 4 (four) hours as needed for pain.     pantoprazole 40 MG tablet  Commonly known as:  PROTONIX  Take 1 tablet (40 mg total) by mouth daily.     polyethylene glycol packet  Commonly known as:  MIRALAX / GLYCOLAX  Take 17 g by mouth daily as needed.     tamsulosin 0.4 MG Caps capsule  Commonly  known as:  FLOMAX  Take 0.4 mg by mouth daily after supper.     traZODone 50 MG tablet  Commonly known as:  DESYREL  Take 1 tablet (50 mg total) by mouth at bedtime as needed and may repeat dose one time if needed for sleep.     VITAMIN C PO  Take 1 tablet by mouth daily.     VITAMIN D PO  Take 1 tablet by mouth daily.     zolpidem 5 MG tablet  Commonly known as:  AMBIEN  Take 1 tablet (5 mg total) by mouth at bedtime as needed and may repeat dose one time if needed for sleep.       No Known Allergies    The results of significant diagnostics from this hospitalization (including imaging, microbiology, ancillary and laboratory) are listed below for reference.    Significant Diagnostic Studies: Dg Chest 2 View  05/08/2015  CLINICAL DATA:  PICC absent indigestion for 3 days. Chest pain. History of lung cancer. Former smoker. EXAM: CHEST  2 VIEW COMPARISON:  CT chest 03/16/2015.  Chest 03/07/2015 FINDINGS: Cavitary mass again demonstrated in the right cardiophrenic angle posteriorly. There is now fluid within the mass and an air-fluid level is demonstrated. Possible right paratracheal and right hilar lymphadenopathy. Normal heart size and pulmonary vascularity. Calcified and tortuous aorta. Diffuse interstitial pattern to the lungs is increased since previous study, possibly indicating interstitial edema, interstitial pneumonia, or interstitial tumor spread. Hyperinflation consistent with underlying emphysema. No pneumothorax. Old left rib fracture. IMPRESSION: Cavitary mass in the right lung base posteriorly now contains an air-fluid level. Probable lymphadenopathy in the right paratracheal and right hilar regions. Increasing interstitial pattern to the lungs may indicate developing interstitial edema, interstitial pneumonia, or interstitial tumor spread. Electronically Signed   By: Lucienne Capers M.D.   On: 05/08/2015 23:12   Ct Head Wo Contrast  05/27/2015  CLINICAL DATA:   60 year old male with frontal headache and increasing lower extremity weakness and numbness with falls. Metastatic lung cancer. EXAM: CT HEAD WITHOUT CONTRAST TECHNIQUE: Contiguous axial images were obtained from the base of the skull through the vertex without intravenous contrast. COMPARISON:  03/28/2015 MR FINDINGS: Mild chronic small-vessel white matter ischemic changes are again noted. No acute intracranial abnormalities are  identified, including mass lesion or mass effect, hydrocephalus, extra-axial fluid collection, midline shift, hemorrhage, or acute infarction. The visualized bony calvarium is unremarkable. A small amount of fluid within both maxillary sinuses noted. IMPRESSION: No evidence of acute intracranial abnormality. Mild chronic small-vessel white matter ischemic changes. Small amount of fluid within both maxillary sinuses which may represent acute sinusitis -correlate clinically. Electronically Signed   By: Margarette Canada M.D.   On: 05/27/2015 18:41   Ct Angio Chest Pe W/cm &/or Wo Cm  05/09/2015  CLINICAL DATA:  Acute onset of generalized chest pain and shortness of breath. Current history of lung cancer. Initial encounter. EXAM: CT ANGIOGRAPHY CHEST WITH CONTRAST TECHNIQUE: Multidetector CT imaging of the chest was performed using the standard protocol during bolus administration of intravenous contrast. Multiplanar CT image reconstructions and MIPs were obtained to evaluate the vascular anatomy. CONTRAST:  153m OMNIPAQUE IOHEXOL 350 MG/ML SOLN COMPARISON:  CT of the chest performed 03/16/2015, and PET/CT performed 03/28/2015 FINDINGS: There is no evidence of pulmonary embolus. The large cavitary mass at the right lower lobe is similar in size to the prior study, measuring approximately 8.1 x 7.5 cm, but there is now a large amount of fluid within the mass, raising concern for underlying infection. There is increasing airspace opacification at the right lung base, concerning for  postobstructive pneumonia. An increasing small right pleural effusion is noted, likely reflecting a malignant effusion. The left lung remains clear.  No pneumothorax is seen. Marked mediastinal lymphadenopathy is perhaps slightly improved, with a mass measuring 3.7 cm in short axis at the azygoesophageal region, and another mass measuring 3.1 cm in short axis at the right paratracheal region. Right hilar lymphadenopathy is also noted. No significant pericardial effusion is identified. Scattered calcification is noted along the thoracic aorta and proximal great vessels. No axillary lymphadenopathy is seen. The visualized portions of the thyroid gland are unremarkable in appearance. A 2.2 cm metastasis is again noted within the spleen. Visualized portions of the liver are grossly unremarkable. Bilateral large adrenal masses are again noted, measuring approximately 5.2 cm on the right and 3.6 cm on the left. No acute osseous abnormalities are seen. Review of the MIP images confirms the above findings. IMPRESSION: 1. No evidence of pulmonary embolus. 2. Large cavitary mass at the right lower lobe is similar in size, measuring 8.1 x 7.5 cm, but there is now a large amount of fluid within the mass, concerning for underlying infection. Increasing airspace opacification at the right lung base, concerning for postobstructive pneumonia. 3. Increasing small right pleural effusion, likely reflecting a malignant effusion. 4. Marked mediastinal lymphadenopathy is perhaps slightly improved, with masses measuring up to 3.7 cm in short axis. Right hilar lymphadenopathy also seen. 5. Adrenal and splenic metastases again noted. Electronically Signed   By: JGarald BaldingM.D.   On: 05/09/2015 01:47   Mr Cervical Spine Wo Contrast  05/26/2015  ADDENDUM REPORT: 05/26/2015 21:49 ADDENDUM: Acute findings discussed with and reconfirmed by Dr.MATTHEW MANNING on 05/26/2015 at 9:30 pm. Electronically Signed   By: CElon AlasM.D.    On: 05/26/2015 21:49  05/26/2015  CLINICAL DATA:  Progressive paralysis, unable to the legs. Assess for cord compression. History of metastatic lung cancer, radiation. EXAM: MRI CERVICAL SPINE WITHOUT CONTRAST TECHNIQUE: Multiplanar, multisequence MR imaging of the cervical spine was performed. No intravenous contrast was administered. COMPARISON:  MRI of the cervical spine March 07, 2015 in CT cervical spine FINDINGS: Moderate to severely motion degraded examination. Again noted is  abnormal low T1, bright STIR signal within vertebral body C6, inferior endplate of C5 and superior endplate of C7 though degraded by motion, the degree of bone marrow signal abnormality appears slightly worse. No pathologic fracture. Grade 1 C4-5 retrolisthesis gait, stable from prior imaging. Maintenance of cervical lordosis. Scattered old Schmorl's nodes. Due to motion, limited assessment for cord signal abnormality. No definite syrinx. No cerebellar tonsillar ectopia. Re- demonstration of bright STIR signal within the RIGHT paraspinal soft tissues at C5-6, associated with bone marrow edema, present on prior imaging though, suspected to be worse today. Due to motion, significantly limited assessment of the individual levels. However C2-3: No definite disc bulge, canal stenosis or neural foraminal narrowing. C3-4: Small suspected broad-based disc bulge, uncovertebral hypertrophy and facet arthropathy with mild canal stenosis and probable at least moderate neural foraminal narrowing. C4-5: Retrolisthesis. Small broad-based disc bulge, uncovertebral hypertrophy and facet arthropathy result in moderate canal stenosis, probable at least moderate RIGHT and moderate to severe LEFT neural foraminal narrowing. C5-6: Small broad-based disc bulge, uncovertebral hypertrophy and facet arthropathy resulting in moderate canal stenosis and probable moderate to severe neural foraminal narrowing. C6-7: Small broad-based disc bulge, uncovertebral  hypertrophy and facet arthropathy resulting in moderate canal stenosis and probable severe neural foraminal narrowing. C7-T1: No definite disc bulge, canal stenosis or neural foraminal narrowing. IMPRESSION: Moderate to severely motion degraded examination. Worsening abnormal signal within C6, and C5/C7 endplates concerning for metastatic disease or possibly infection. Persistent abnormal signal in RIGHT C4-5 facet associated with mid grade paraspinal muscle strain/myositis. Suspected moderate canal stenosis C4-5 through C6-7. Motion limits assessment for cord edema. No syrinx. Suspected neural foraminal narrowing at C3-4 thru C6-7, likely severe at C6-7. Electronically Signed: By: Elon Alas M.D. On: 05/26/2015 21:06   Mr Cervical Spine W Contrast  05/27/2015  CLINICAL DATA:  Suspected spinal cord compression. Leg weakness and urinary retention. No sensory level. Lung cancer. History of cervical radiation. EXAM: MRI CERVICAL SPINE WITH CONTRAST; MRI THORACIC SPINE WITHOUT AND WITH CONTRAST TECHNIQUE: Multiplanar and multiecho pulse sequences of the cervical spine, to include the craniocervical junction and cervicothoracic junction, were obtained according to standard protocol with intravenous contrast.; Multiplanar and multiecho pulse sequences of the thoracic spine were obtained without and with intravenous contrast. CONTRAST:  23m MULTIHANCE GADOBENATE DIMEGLUMINE 529 MG/ML IV SOLN COMPARISON:  MRI cervical spine without contrast motion degraded. CT chest 05/09/2015. PET scan 03/28/2015. MRI lumbar spine 05/25/2015. MR head 03/28/2015. FINDINGS: MRI THORACIC SPINE WITHOUT AND WITH CONTRAST: Numbering of the thoracic spine was accomplished by counting down from the odontoid. Widespread degenerative disease is present, similar to that noted in the lumbar spine on previous study, with associated discogenic edema and enhancement most prominent at T6-7 and T8-9. There are in addition metastatic in the  thoracic spine. The largest metastasis is at T5 with near complete replacement of that vertebral body. Smaller lesions are seen in T4, T7, and T9. No pathologic fracture is evident. No intraspinal enhancement to suggest epidural tumor. No enhancement or enlargement of the cord to suggest intramedullary spread of neoplasm. Fairly multiple prominent thoracic disc protrusions are observed, which could contribute to back pain, but do not significantly compress the cord. These are noted at T8-9 on LEFT, T7-8 RIGHT paramedian, and T9-10 RIGHT paramedian. RIGHT greater than LEFT pleural effusions. RIGHT lower lobe mass. BILATERAL adrenal metastases. MRI CERVICAL SPINE WITH CONTRAST: Post infusion imaging of the cervical spine was performed. There is mild canal stenosis due to cervical spondylosis with disc  osteophyte complex at C4-5, C5-6, and C6-7. The patient has undergone radiation to the destructive mass at C6. Some residual vertebral body enhancement is present at C6 notably the RIGHT posterior elements. Paravertebral soft tissue at this location is confirmed. RIGHT vertebral artery is poorly visualized. Equivocal central T2 hyperintensity of the cord from C3 through C7 without cord enlargement. This is favored to represent artifact. No enhancement is seen. No features suggestive of cord infarction or radiation injury. IMPRESSION: No epidural tumor or significant cord compression in the thoracic spine. Multiple thoracic vertebral body metastases without pathologic fracture, largest at T5. Multiple thoracic disc protrusions, noncompressive. RIGHT greater than LEFT thoracic pleural effusions. RIGHT lower lobe mass. BILATERAL adrenal metastases. Post XRT changes in the cervical spine, with persistent abnormal enhancement of the C6 vertebral body extending into the RIGHT posterior elements. No cervical spine epidural tumor or compressive lesion is observed. Findings discussed with Dr. Tammi Klippel at approximately 3:45 p.m. by  telephone. Electronically Signed   By: Staci Righter M.D.   On: 05/27/2015 17:14   Mr Thoracic Spine W Wo Contrast  05/27/2015  CLINICAL DATA:  Suspected spinal cord compression. Leg weakness and urinary retention. No sensory level. Lung cancer. History of cervical radiation. EXAM: MRI CERVICAL SPINE WITH CONTRAST; MRI THORACIC SPINE WITHOUT AND WITH CONTRAST TECHNIQUE: Multiplanar and multiecho pulse sequences of the cervical spine, to include the craniocervical junction and cervicothoracic junction, were obtained according to standard protocol with intravenous contrast.; Multiplanar and multiecho pulse sequences of the thoracic spine were obtained without and with intravenous contrast. CONTRAST:  43m MULTIHANCE GADOBENATE DIMEGLUMINE 529 MG/ML IV SOLN COMPARISON:  MRI cervical spine without contrast motion degraded. CT chest 05/09/2015. PET scan 03/28/2015. MRI lumbar spine 05/25/2015. MR head 03/28/2015. FINDINGS: MRI THORACIC SPINE WITHOUT AND WITH CONTRAST: Numbering of the thoracic spine was accomplished by counting down from the odontoid. Widespread degenerative disease is present, similar to that noted in the lumbar spine on previous study, with associated discogenic edema and enhancement most prominent at T6-7 and T8-9. There are in addition metastatic in the thoracic spine. The largest metastasis is at T5 with near complete replacement of that vertebral body. Smaller lesions are seen in T4, T7, and T9. No pathologic fracture is evident. No intraspinal enhancement to suggest epidural tumor. No enhancement or enlargement of the cord to suggest intramedullary spread of neoplasm. Fairly multiple prominent thoracic disc protrusions are observed, which could contribute to back pain, but do not significantly compress the cord. These are noted at T8-9 on LEFT, T7-8 RIGHT paramedian, and T9-10 RIGHT paramedian. RIGHT greater than LEFT pleural effusions. RIGHT lower lobe mass. BILATERAL adrenal metastases. MRI  CERVICAL SPINE WITH CONTRAST: Post infusion imaging of the cervical spine was performed. There is mild canal stenosis due to cervical spondylosis with disc osteophyte complex at C4-5, C5-6, and C6-7. The patient has undergone radiation to the destructive mass at C6. Some residual vertebral body enhancement is present at C6 notably the RIGHT posterior elements. Paravertebral soft tissue at this location is confirmed. RIGHT vertebral artery is poorly visualized. Equivocal central T2 hyperintensity of the cord from C3 through C7 without cord enlargement. This is favored to represent artifact. No enhancement is seen. No features suggestive of cord infarction or radiation injury. IMPRESSION: No epidural tumor or significant cord compression in the thoracic spine. Multiple thoracic vertebral body metastases without pathologic fracture, largest at T5. Multiple thoracic disc protrusions, noncompressive. RIGHT greater than LEFT thoracic pleural effusions. RIGHT lower lobe mass. BILATERAL adrenal metastases.  Post XRT changes in the cervical spine, with persistent abnormal enhancement of the C6 vertebral body extending into the RIGHT posterior elements. No cervical spine epidural tumor or compressive lesion is observed. Findings discussed with Dr. Tammi Klippel at approximately 3:45 p.m. by telephone. Electronically Signed   By: Staci Righter M.D.   On: 05/27/2015 17:14   Mr Lumbar Spine W Wo Contrast  05/25/2015  CLINICAL DATA:  Lung cancer patient with development of low back pain with leg weakness and tingling in the feet. EXAM: MRI LUMBAR SPINE WITHOUT AND WITH CONTRAST TECHNIQUE: Multiplanar and multiecho pulse sequences of the lumbar spine were obtained without and with intravenous contrast. CONTRAST:  79m MULTIHANCE GADOBENATE DIMEGLUMINE 529 MG/ML IV SOLN COMPARISON:  CT abdomen 05/23/2015.  PET scan 03/28/2015. FINDINGS: There is evidence of osseous metastatic disease in the region within the right transverse process  of L1. This does not have any effect upon the neural structures. There is a metastasis within the S1 vertebral body without extraosseous extension. There is discogenic edema and enhancement at the endplates at TW58-K9 LX8-3 L2-3 and especially at the L5-S1 level. This could certainly be associated with back pain. Right paracentral disc herniation at T12-L1 indents the thecal sac and could cause neural compression in the right lateral recess. Shallow broad-based disc herniation at L1-2 indents the thecal sac and causes narrowing of both lateral recesses without definite neural compression. Disc bulge at L2-3 is associated with mild bilateral lateral recess narrowing. Disc bulge at L3-4 in combination with facet and ligamentous hypertrophy is associated with bilateral lateral recess narrowing without definite neural compression. Disc bulge at L4-5 and mild facet hypertrophy results in mild lateral recess narrowing but no definite neural compression. Shallow disc protrusion at L5-S1 does not cause any central canal stenosis. Mild stenosis of both subarticular lateral recesses and foramina. The exiting L5 nerve roots would be at risk of compression. IMPRESSION: Metastatic disease to the right transverse process of L1, without any effect upon the neural structures. Metastatic disease to the S1 vertebral body, without any extraosseous extension. 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 Right posterior lateral disc herniation at T12-L1 with right lateral recess narrowing that could cause neural compression. Lateral recess narrowing at L1-2, L2-3, L3-4, L4-5 and L5-S1, without severe disease or definite neural compression. Foraminal stenosis bilaterally at L5-S1 could possibly affect the exiting L5 nerve roots on either or both sides. Electronically Signed   By: MNelson ChimesM.D.   On: 05/25/2015 17:27   Ct Abdomen Pelvis W  Contrast  05/23/2015  CLINICAL DATA:  Suprapubic pain, urinary retention x5 days. Newly diagnosed lung cancer with metastases to adrenal glands and lymph nodes. EXAM: CT ABDOMEN AND PELVIS WITH CONTRAST TECHNIQUE: Multidetector CT imaging of the abdomen and pelvis was performed using the standard protocol following bolus administration of intravenous contrast. CONTRAST:  1062mOMNIPAQUE IOHEXOL 300 MG/ML  SOLN COMPARISON:  PET-CT dated 03/28/2015.  CTA chest dated 05/09/2015. FINDINGS: Lower chest: Thick-walled cavitary mass with air-fluid level in the right lower lobe, measuring 5.3 x 4.4 cm, previously 8.1 x 7.5 cm on recent CT chest. 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 Small right pleural effusion. Hepatobiliary: Liver is within normal limits. Gallbladder is unremarkable. No intrahepatic or extrahepatic ductal dilatation. Pancreas: Within normal limits. Spleen: Two lesions  in the anterior spleen measuring up to 1.5 cm, previously 2.2 cm, at least one of which was hypermetabolic and likely reflects a metastasis. Adrenals/Urinary Tract: Bilateral adrenal metastases, measuring 4.9 x 3.1 cm on the right (previously 5.2 cm) and 2.8 x 1.7 cm on the left (previously 3.6 cm). Kidneys are within normal limits.  No hydronephrosis. Bladder is moderately distended. Stomach/Bowel: Stomach is within normal limits. No evidence of bowel obstruction. Appendix is not discretely visualized. Vascular/Lymphatic: Atherosclerotic calcifications of the abdominal aorta and branch vessels. 12 mm short axis gastrohepatic node (series 2/ image 15), previously 19 mm. 9 mm short axis portacaval node (series 2/image 21) and 7 mm short axis left para-aortic node (series 2/ image 29), within the upper limits of normal. Reproductive: Mild prostatomegaly. Other: No abdominopelvic ascites. Musculoskeletal:  Degenerative changes of the visualized thoracolumbar spine, most prominent at L5-S1. IMPRESSION: Bladder is moderately distended.  Mild prostatomegaly. 5.3 x 4.4 cm thick-walled cavitary mass in the right lower lobe, corresponding to known primary bronchogenic neoplasm, decreased. 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 Small right pleural effusion. Bilateral adrenal and splenic metastases, improved. Gastrohepatic nodal metastasis, improved. Electronically Signed   By: Julian Hy M.D.   On: 05/23/2015 17:12   Dg Chest Port 1 View  05/27/2015  CLINICAL DATA:  Acute onset of shortness of breath. Initial encounter. EXAM: PORTABLE CHEST 1 VIEW COMPARISON:  CTA of the chest performed 05/09/2015 FINDINGS: The patient's known large right lower lobe lung mass is better characterized on prior CTA, with central cavitation and surrounding opacities. Superimposed pneumonia remains a concern. Mild vascular congestion is noted. No pleural effusion or pneumothorax is seen. The cardiomediastinal silhouette remains normal in size. No acute osseous abnormalities are identified. IMPRESSION: Known large right lower lobe lung mass is better characterized on prior CTA, with central cavitation and surrounding opacities. Superimposed pneumonia remains a concern. Mild vascular congestion noted. Electronically Signed   By: Garald Balding M.D.   On: 05/27/2015 02:38   Dg Swallowing Func-speech Pathology  05/09/2015  Ephraim Hamburger, CCC-SLP     05/09/2015  5:43 PM Objective Swallowing Evaluation: MBS-Modified Barium Swallow Study Patient Details Name: LEVEN HOEL MRN: [id number] Date of Birth: 15-Aug-1955 Today's Date: 05/09/2015 Time: SLP Start Time (ACUTE ONLY): 1414-SLP Stop Time (ACUTE ONLY): 1430 SLP Time Calculation (min) (ACUTE ONLY): 16 min Past Medical History: Past Medical History Diagnosis Date . Cancer ([place])    Lung Past Surgical History: History  reviewed. No pertinent past surgical history. HPI: Barry Cook is an 60 y.o. 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 He has no fever, chills, or coughs. He denied SOB, but admitted to having trouble with swallowing. Evlaution in the ER included a Hb of 8.8 g per dL, a drop from prior normal Hb. He has no black stool or bloody stool, and has no evidence of bleeding. EKG showed no acute ST T changes, and troponin was normal. He had a CTA which was negative for PE, confirming the present of his large lung mass, and also suggestive of PNA. He was given IV Zosyn in the ER, and hospitalist was asked to admit him for chest pain, anemia, nausea, vomting, and hiccups. He normally sees Dr Curt Bears at [facility] Subjective: "  Doing alright" Assessment / Plan / Recommendation CHL IP CLINICAL IMPRESSIONS 05/09/2015 Therapy Diagnosis WFL Clinical Impression MBSS completed this date. Swallow function essentially WFL. No penetration or aspiration observed. Pt with mild vallecular residue, but clears with repeat/dry swallow. No stasis noted in pharynx. Barium tablet became transiently delayed in distal esophagus near GE junction, but passed after sips of water and bite of applesauce. Pt with some belching after completion of test. SLP reinforced need for pt to continue with oral diet to help combat the effects of radiation therapy long term. Given normal swallow function in today's study, prandial aspiration is doubtful. Pt does endorse heartburn and some heaving due to hiccups so it is possible that pt aspirated post prandial and/or at night. Pt reports elevating HOB at night which he will continue. Recommend continuing soft diet as ordered as pt reports some difficulty with meats/breads;  ok for thin liquids. SLP will follow up X1 for diet tolerance and ongoing education as needed. Pt in agreement with plan of care.  Impact on safety and function No limitations   CHL IP TREATMENT RECOMMENDATION 05/09/2015 Treatment Recommendations Therapy as outlined in treatment plan below   Prognosis 05/09/2015 Prognosis for Safe Diet Advancement Good Barriers to Reach Goals -- Barriers/Prognosis Comment -- CHL IP DIET RECOMMENDATION 05/09/2015 SLP Diet Recommendations Regular solids;Dysphagia 3 (Mech soft) solids;Thin liquid Liquid Administration via Cup;Straw Medication Administration Whole meds with liquid Compensations Multiple dry swallows after each bite/sip Postural Changes Remain semi-upright after after feeds/meals (Comment);Seated upright at 90 degrees   CHL IP OTHER RECOMMENDATIONS 05/09/2015 Recommended Consults -- Oral Care Recommendations Oral care BID Other Recommendations Clarify dietary restrictions   CHL IP FOLLOW UP RECOMMENDATIONS 05/09/2015 Follow up Recommendations None   CHL IP FREQUENCY AND DURATION 05/09/2015 Speech Therapy Frequency (ACUTE ONLY) min 1 x/week Treatment Duration --      CHL IP ORAL PHASE 05/09/2015 Oral Phase WFL Oral - Pudding Teaspoon -- Oral - Pudding Cup -- Oral - Honey Teaspoon -- Oral - Honey Cup -- Oral - Nectar Teaspoon -- Oral - Nectar Cup -- Oral - Nectar Straw -- Oral - Thin Teaspoon -- Oral - Thin Cup -- Oral - Thin Straw -- Oral - Puree -- Oral - Mech Soft -- Oral - Regular -- Oral - Multi-Consistency -- Oral - Pill -- Oral Phase - Comment --  CHL IP PHARYNGEAL PHASE 05/09/2015 Pharyngeal Phase WFL;Impaired Pharyngeal- Pudding Teaspoon -- Pharyngeal -- Pharyngeal- Pudding Cup -- Pharyngeal -- Pharyngeal- Honey Teaspoon -- Pharyngeal -- Pharyngeal- Honey Cup -- Pharyngeal -- Pharyngeal- Nectar Teaspoon -- Pharyngeal -- Pharyngeal- Nectar Cup -- Pharyngeal -- Pharyngeal- Nectar Straw -- Pharyngeal -- Pharyngeal- Thin Teaspoon -- Pharyngeal -- Pharyngeal- Thin Cup  Delayed swallow initiation-vallecula Pharyngeal -- Pharyngeal- Thin Straw Delayed swallow initiation-vallecula;Pharyngeal residue - valleculae Pharyngeal -- Pharyngeal- Puree Delayed swallow initiation-vallecula Pharyngeal -- Pharyngeal- Mechanical Soft -- Pharyngeal -- Pharyngeal- Regular WFL Pharyngeal -- Pharyngeal- Multi-consistency -- Pharyngeal -- Pharyngeal- Pill WFL Pharyngeal -- Pharyngeal Comment --  CHL IP CERVICAL ESOPHAGEAL PHASE 05/09/2015 Cervical Esophageal Phase WFL Pudding Teaspoon -- Pudding Cup -- Honey Teaspoon -- Honey Cup -- Nectar Teaspoon -- Nectar Cup -- Nectar Straw -- Thin Teaspoon -- Thin Cup -- Thin Straw -- Puree -- Mechanical Soft -- Regular -- Multi-consistency -- Pill -- Cervical Esophageal Comment -- Thank you, Genene Churn, CCC-SLP [phone] PORTER,DABNEY 05/09/2015, 5:42 PM               Microbiology: Recent Results (from the past 240 hour(s))  Urine  culture     Status: None   Collection Time: 05/23/15  2:10 PM  Result Value Ref Range Status   Specimen Description URINE, CLEAN CATCH  Final   Special Requests NONE  Final   Culture   Final    NO GROWTH 1 DAY Performed at [facility]    Report Status 05/24/2015 FINAL  Final  MRSA PCR Screening     Status: None   Collection Time: 05/27/15  5:38 PM  Result Value Ref Range Status   MRSA by PCR NEGATIVE NEGATIVE Final    Comment:        The GeneXpert MRSA Assay (FDA approved for NASAL specimens only), is one component of a comprehensive MRSA colonization surveillance program. It is not intended to diagnose MRSA infection nor to guide or monitor treatment for MRSA infections.      Labs: Basic Metabolic Panel:  Recent Labs Lab 05/25/15 1300 05/26/15 0435 05/27/15 0450 05/28/15 0506 05/29/15 0745  NA 134* 138 135 137 137  K 4.5 4.3 4.2 4.0 4.2  CL 102 105 105 107 106  CO2 '24 26 22 23 25  '$ GLUCOSE 97 102* 141* 119* 97  BUN '16 14 11 11 12  '$ CREATININE 0.67 0.76 0.67 0.45* 0.52*    CALCIUM 9.4 8.8* 9.1 8.8* 8.8*  MG  --   --  1.9 1.9  --    Liver Function Tests:  Recent Labs Lab 05/25/15 1300 05/26/15 0435  AST 18 13*  ALT 18 16*  ALKPHOS 73 66  BILITOT 0.5 0.6  PROT 6.8 6.0*  ALBUMIN 3.2* 2.8*   No results for input(s): LIPASE, AMYLASE in the last 168 hours. No results for input(s): AMMONIA in the last 168 hours. CBC:  Recent Labs Lab 05/25/15 1300 05/26/15 0435 05/27/15 0450 05/28/15 0506 05/29/15 0745  WBC 6.6 5.0 5.3 8.4 7.5  NEUTROABS 3.2 2.5  --   --   --   HGB 11.7* 10.7* 11.1* 9.9* 10.3*  HCT 36.7* 33.2* 33.6* 30.2* 31.8*  MCV 87.6 88.5 86.4 86.5 87.6  PLT 200 196 196 181 174   Cardiac Enzymes:  Recent Labs Lab 05/28/15 0506  CKTOTAL 59   BNP: BNP (last 3 results) No results for input(s): BNP in the last 8760 hours.  ProBNP (last 3 results) No results for input(s): PROBNP in the last 8760 hours.  CBG: No results for input(s): GLUCAP in the last 168 hours.     SignedFlorencia Reasons MD, PhD  [facility] 05/30/2015, 4:44 PM

## 2015-05-30 NOTE — Care Management Note (Addendum)
Case Management Note  Patient Details  Name: Barry Cook MRN: [id number] Date of Birth: 10-Sep-1955  Subjective/Objective:     60 yo admitted with Intractable back pain. Hx of Metastatic cancer               Action/Plan: From home with wife  Expected Discharge Date:   (unknown)               Expected Discharge Plan:  Home w Hospice Care  In-House Referral:     Discharge planning Services  CM Consult  Post Acute Care Choice:  Hospice Choice offered to:  Spouse  DME Arranged:    DME Agency:     HH Arranged:  Disease Management [place] Agency:  [facility]  Status of Service:  In process, will continue to follow  Medicare Important Message Given:    Date Medicare IM Given:    Medicare IM give by:    Date Additional Medicare IM Given:    Additional Medicare Important Message give by:     If discussed at [place] of Stay Meetings, dates discussed:    Additional Comments: CM consult for home hospice choice. This CM met with pt and wife at bedside to offer choice. Pt sleeping off and on during conversation. Wife [name] would like pt to go home with [facility]. She states they did a good job with her mother. Address and phone numbers verified with [name]. [name] states she can get pt home in her truck but they will need a hospital bed, wheelchair, bedside commode and over bed table for home. Pt currently has home oxygen. Referral called to [facility]. Information faxed to [phone]. Out of facility DNR filled out by MD and will need to be sent home with pt. Prescriptions covered by hospice will be faxed to [facility]. CM will continue to follow.  15:31 Addendum:  Prescriptions for Ativan and Roxicodone faxed to [facility] per instruction of Hospice rep. Staff RN instructed to send home out of facility DNR form. No other CM needs communicated. Lynnell Catalan, RN 05/30/2015, 12:13 PM

## 2015-05-31 ENCOUNTER — Telehealth: Payer: Self-pay | Admitting: *Deleted

## 2015-05-31 ENCOUNTER — Telehealth: Payer: Self-pay | Admitting: Internal Medicine

## 2015-05-31 ENCOUNTER — Telehealth: Payer: Self-pay | Admitting: Medical Oncology

## 2015-05-31 ENCOUNTER — Other Ambulatory Visit: Payer: Self-pay | Admitting: *Deleted

## 2015-05-31 DIAGNOSIS — C7970 Secondary malignant neoplasm of unspecified adrenal gland: Secondary | ICD-10-CM

## 2015-05-31 NOTE — Telephone Encounter (Signed)
cancel all appts per pof as pt is hospice now

## 2015-05-31 NOTE — Telephone Encounter (Signed)
PEr Julien Nordmann pt is not goijng to be getting any more treatment . Beth notified.ONc tx request sent to cancel appts

## 2015-05-31 NOTE — Telephone Encounter (Signed)
Received call from Beth/Hospice/[facility] stating that pt was d/c yest from hosp but script for Cymbalta & gabapentin were not given.  She req that these be sent to [facility].  She also states that he is indigent with Hospice & wonders if megace can be changed to prednisone 20 mg daily due to cost of megace.  She also states that they are not covering Protonix & the family wants that sent to [facility].  This message was routed to Dr. Julien Nordmann & RN

## 2015-05-31 NOTE — Telephone Encounter (Signed)
Beth RN with Hospice called in reference to referral received on th is patient.  Barry Cook was discharged from [facility] yesterday.  "We need to ensure Dr. Julien Nordmann does not intend for this patient to receive immunotherapy Ketruda as scheduled on 06-05-2015 and 06-26-2015.  Please call [phone].

## 2015-05-31 NOTE — Telephone Encounter (Signed)
MM patient

## 2015-05-31 NOTE — Telephone Encounter (Signed)
Barry Cook is asking if pt is going to receive any more chemo/immuno therapy.I left message for her to call back.

## 2015-05-31 NOTE — Telephone Encounter (Signed)
Ok to [place] these changes and order it

## 2015-06-01 MED ORDER — DULOXETINE HCL 20 MG PO CPEP: 20.0000 mg | ORAL_CAPSULE | Freq: Every day | ORAL | Status: DC

## 2015-06-01 MED ORDER — GABAPENTIN 300 MG PO CAPS: 300.0000 mg | ORAL_CAPSULE | Freq: Three times a day (TID) | ORAL | Status: DC

## 2015-06-01 MED ORDER — PREDNISONE 20 MG PO TABS: 20.0000 mg | ORAL_TABLET | Freq: Every day | ORAL | Status: DC

## 2015-06-01 MED ORDER — PANTOPRAZOLE SODIUM 40 MG PO TBEC: 40.0000 mg | DELAYED_RELEASE_TABLET | Freq: Every day | ORAL | Status: AC

## 2015-06-01 NOTE — Telephone Encounter (Signed)
Done

## 2015-06-02 MED FILL — Lactated Ringer's Solution: INTRAVENOUS | Qty: 1000 | Status: AC

## 2015-06-02 MED FILL — Propofol IV Emul 200 MG/20ML (10 MG/ML): INTRAVENOUS | Qty: 10 | Status: AC

## 2015-06-02 MED FILL — Phenylephrine-NaCl Pref Syr 0.4 MG/10ML-0.9% (40 MCG/ML): INTRAVENOUS | Qty: 10 | Status: AC

## 2015-06-02 MED FILL — Lidocaine HCl IV Inj 20 MG/ML: INTRAVENOUS | Qty: 5 | Status: AC

## 2015-06-02 MED FILL — Ephedrine Sulfate Inj 50 MG/ML: INTRAMUSCULAR | Qty: 1 | Status: AC

## 2015-06-05 ENCOUNTER — Other Ambulatory Visit: Payer: Self-pay

## 2015-06-05 ENCOUNTER — Ambulatory Visit: Payer: Self-pay | Admitting: Internal Medicine

## 2015-06-05 ENCOUNTER — Ambulatory Visit: Payer: Self-pay

## 2015-06-07 ENCOUNTER — Telehealth: Payer: Self-pay | Admitting: *Deleted

## 2015-06-07 MED ORDER — FENTANYL 75 MCG/HR TD PT72: 75.0000 ug | MEDICATED_PATCH | TRANSDERMAL | Status: DC

## 2015-06-07 MED ORDER — ZOLPIDEM TARTRATE 10 MG PO TABS: 10.0000 mg | ORAL_TABLET | Freq: Every evening | ORAL | Status: DC | PRN

## 2015-06-07 NOTE — Telephone Encounter (Signed)
Call from [place], request to  Increase fentanyl patch to 109mg and ambien to '10mg'$ . Reviewed with MD, ok to increase, [facility]

## 2015-06-13 ENCOUNTER — Telehealth: Payer: Self-pay | Admitting: *Deleted

## 2015-06-13 ENCOUNTER — Other Ambulatory Visit: Payer: Self-pay | Admitting: Internal Medicine

## 2015-06-13 ENCOUNTER — Telehealth: Payer: Self-pay | Admitting: Internal Medicine

## 2015-06-13 ENCOUNTER — Other Ambulatory Visit: Payer: Self-pay | Admitting: *Deleted

## 2015-06-13 NOTE — Telephone Encounter (Signed)
I will be happy to see him again for follow-up visit and discussion of the options. If they agree please arrange for him to come for the first available follow-up visit.

## 2015-06-13 NOTE — Telephone Encounter (Signed)
Aware of follow up 1/25

## 2015-06-13 NOTE — Telephone Encounter (Signed)
Wife called- states patient is wanting to know if there is anything else that can be done to treat his cancer. Any aggressive cancer treatment or any cancer trials, any PT to help legs?  Didn't get to talk with Dr Julien Nordmann when he was being discharged from hospital, only Palliative Care doctors.  Please advise.

## 2015-06-15 ENCOUNTER — Other Ambulatory Visit: Payer: Self-pay | Admitting: Internal Medicine

## 2015-06-21 ENCOUNTER — Telehealth: Payer: Self-pay | Admitting: Internal Medicine

## 2015-06-21 ENCOUNTER — Ambulatory Visit: Payer: Self-pay | Admitting: Internal Medicine

## 2015-06-21 NOTE — Telephone Encounter (Signed)
Returned call to patient wife re r/s 1/20 appointment. Gave wife new appointment for 2/20.

## 2015-06-22 ENCOUNTER — Telehealth: Payer: Self-pay | Admitting: *Deleted

## 2015-06-22 ENCOUNTER — Other Ambulatory Visit: Payer: Self-pay | Admitting: *Deleted

## 2015-06-22 MED ORDER — OXYCODONE HCL 15 MG PO TABS: 15.0000 mg | ORAL_TABLET | ORAL | Status: AC | PRN

## 2015-06-22 NOTE — Telephone Encounter (Signed)
TC from pt's wife inquiring about rescheduled appt time for her husband. She states she called about '50' times yesterday and had not gotten a response. Reviewed upcoming appts with her. Appt has been made for February. Wife voiced understanding and will be arranging transportation for him as she needs assistance.

## 2015-06-22 NOTE — Telephone Encounter (Signed)
Returned call to [employer] with Hospice regarding pt in pain and wife giving pt 2 oxycodone '15mg'$  tablets q4hrs. Last refill on 1/24. Pt's wife advised she gave pt the pain meds because he was in pain. Hospice  Rn will advise Pt and wife no additional refills until time to refill and Fentanyl patch will not be increased at this time. No further concerns.

## 2015-06-22 NOTE — Telephone Encounter (Signed)
Request from [place]. For Pain med refill. Rx faxed 1/25

## 2015-06-23 ENCOUNTER — Encounter: Payer: Self-pay | Admitting: Internal Medicine

## 2015-06-23 NOTE — Progress Notes (Signed)
forms left in my box. i placed for dr. Julien Nordmann to sign

## 2015-06-26 ENCOUNTER — Other Ambulatory Visit: Payer: Self-pay | Admitting: Medical Oncology

## 2015-06-26 ENCOUNTER — Other Ambulatory Visit: Payer: Self-pay

## 2015-06-26 ENCOUNTER — Encounter: Payer: Self-pay | Admitting: Internal Medicine

## 2015-06-26 ENCOUNTER — Ambulatory Visit: Payer: Self-pay

## 2015-06-26 ENCOUNTER — Ambulatory Visit: Payer: Self-pay | Admitting: Oncology

## 2015-06-26 DIAGNOSIS — C7970 Secondary malignant neoplasm of unspecified adrenal gland: Secondary | ICD-10-CM

## 2015-06-26 MED ORDER — ZOLPIDEM TARTRATE 10 MG PO TABS: 10.0000 mg | ORAL_TABLET | Freq: Every evening | ORAL | Status: DC | PRN

## 2015-06-26 NOTE — Progress Notes (Signed)
I faxed forms to [phone]

## 2015-06-26 NOTE — Progress Notes (Signed)
FAXED [facility]

## 2015-06-29 ENCOUNTER — Other Ambulatory Visit: Payer: Self-pay | Admitting: Internal Medicine

## 2015-07-05 ENCOUNTER — Ambulatory Visit (INDEPENDENT_AMBULATORY_CARE_PROVIDER_SITE_OTHER): Payer: Medicaid Other | Admitting: Urology

## 2015-07-05 DIAGNOSIS — R32 Unspecified urinary incontinence: Secondary | ICD-10-CM | POA: Diagnosis not present

## 2015-07-05 DIAGNOSIS — N401 Enlarged prostate with lower urinary tract symptoms: Secondary | ICD-10-CM

## 2015-07-05 DIAGNOSIS — R3915 Urgency of urination: Secondary | ICD-10-CM

## 2015-07-12 ENCOUNTER — Other Ambulatory Visit: Payer: Self-pay | Admitting: Medical Oncology

## 2015-07-12 DIAGNOSIS — C7970 Secondary malignant neoplasm of unspecified adrenal gland: Secondary | ICD-10-CM

## 2015-07-12 DIAGNOSIS — G4700 Insomnia, unspecified: Secondary | ICD-10-CM

## 2015-07-12 MED ORDER — FENTANYL 75 MCG/HR TD PT72: 75.0000 ug | MEDICATED_PATCH | TRANSDERMAL | Status: AC

## 2015-07-12 MED ORDER — DULOXETINE HCL 20 MG PO CPEP: 20.0000 mg | ORAL_CAPSULE | Freq: Every day | ORAL | Status: AC

## 2015-07-12 MED ORDER — GABAPENTIN 300 MG PO CAPS: ORAL_CAPSULE | ORAL | Status: AC

## 2015-07-12 NOTE — Progress Notes (Signed)
Fentanyl, gabapentin and cymbalta faxed to local pharmacy

## 2015-07-17 ENCOUNTER — Encounter: Payer: Self-pay | Admitting: Internal Medicine

## 2015-07-17 ENCOUNTER — Ambulatory Visit (HOSPITAL_BASED_OUTPATIENT_CLINIC_OR_DEPARTMENT_OTHER): Payer: Medicaid Other | Admitting: Internal Medicine

## 2015-07-17 ENCOUNTER — Telehealth: Payer: Self-pay | Admitting: Internal Medicine

## 2015-07-17 VITALS — BP 71/51 | HR 98 | Temp 97.6°F | Resp 18 | Ht 72.0 in

## 2015-07-17 DIAGNOSIS — C799 Secondary malignant neoplasm of unspecified site: Secondary | ICD-10-CM

## 2015-07-17 DIAGNOSIS — R531 Weakness: Secondary | ICD-10-CM

## 2015-07-17 DIAGNOSIS — C7951 Secondary malignant neoplasm of bone: Secondary | ICD-10-CM | POA: Diagnosis not present

## 2015-07-17 DIAGNOSIS — C3431 Malignant neoplasm of lower lobe, right bronchus or lung: Secondary | ICD-10-CM | POA: Diagnosis not present

## 2015-07-17 DIAGNOSIS — C7971 Secondary malignant neoplasm of right adrenal gland: Secondary | ICD-10-CM

## 2015-07-17 DIAGNOSIS — C7972 Secondary malignant neoplasm of left adrenal gland: Secondary | ICD-10-CM

## 2015-07-17 NOTE — Telephone Encounter (Signed)
,   gave pt AVS and Calendar.... KJ, sent msg to HIM for referral to [facility]

## 2015-07-17 NOTE — Progress Notes (Signed)
[place] [phone]   [phone]  OFFICE PROGRESS NOTE  No PCP Per Patient No address on file  DIAGNOSIS: Stage IV (T3, N3,M1b) non-small cell lung cancer, poorly differentiated carcinoma, PDL 1 expression 60% diagnosed in November 2016. It presented with large cavitary right lower lobe lung mass in addition to ipsilateral and contralateral mediastinal lymphadenopathy as well as right axillary, right cervical and upper retroperitoneal nodal metastases in addition to metastatic bone disease and bilateral adrenal metastasis.  PRIOR THERAPY:  1) Palliative radiotherapy to the metastatic bone lesion in the cervical spine. 2) Ketruda (pembrolizumab) 200 mg IV every 3 weeks. First dose 04/24/2015. Discontinued based on the patient his request.  CURRENT THERAPY: Palliative and hospice care.  INTERVAL HISTORY: Barry Cook 60 y.o. male returns to the clinic today for follow-up visit accompanied by his wife. The patient was started on treatment with Ketruda (pembrolizumab) and first dose was given on 04/24/2015. One week later he was admitted to [facility] with significant weakness and fatigue in the lower extremities as well as urinary incontinence. Imaging studies at that time showed evidence for disease progression with significant metastatic bone disease. The patient his wife at that time decided against further treatment and the requested palliative and hospice care. He has been followed by the hospice care of [facility] for the last 3 months or so. He has improvement in the weakness of the right upper extremity but continues to have weakness in the right lower extremity with urinary incontinence. He is in a lot of pain medication including fentanyl patch 125 g/hour every 3 days in addition to oxycodone 15 mg by mouth Q4 hours as needed for pain. He also developed decubitus ulcer and currently managed by the hospice care. He denied  having any significant chest pain, shortness breath, cough or hemoptysis. He denied having any significant weight loss or night sweats. The patient has no nausea or vomiting. He requested reevaluation and asked him about any clinical trials that can improve his weakness as well as his disease.  MEDICAL HISTORY: Past Medical History  Diagnosis Date  . Radiation 03/30/15 - 04/14/15    cervical spine area 30 Gy  . COPD (chronic obstructive pulmonary disease) ([place])   . Enlarged prostate   . Cancer ([place])     Lung  . Metastatic cancer ([place])   . MRSA (methicillin resistant Staphylococcus aureus)     ALLERGIES:  has No Known Allergies.  MEDICATIONS:  Current Outpatient Prescriptions  Medication Sig Dispense Refill  . Ascorbic Acid (VITAMIN C PO) Take 1 tablet by mouth daily.    . B Complex-Biotin-FA (B-COMPLEX PO) Take 1 tablet by mouth daily.    . Cholecalciferol (VITAMIN D PO) Take 1 tablet by mouth daily.    . DULoxetine (CYMBALTA) 20 MG capsule Take 1 capsule (20 mg total) by mouth daily. 15 capsule 0  . fentaNYL (DURAGESIC - DOSED MCG/HR) 25 MCG/HR patch Place 25 mcg onto the skin every 3 (three) days. Take with fentanyl 75 mcg patch    . fentaNYL (DURAGESIC - DOSED MCG/HR) 75 MCG/HR Place 1 patch (75 mcg total) onto the skin every 3 (three) days. 5 patch 0  . gabapentin (NEURONTIN) 300 MG capsule TAKE ONE CAPSULE BY MOUTH 3 TIMES A DAY. 45 capsule 1  . LORazepam (ATIVAN) 1 MG tablet Take 1 tablet (1 mg total) by mouth every 8 (eight) hours. 30 tablet 0  . Multiple Vitamin (MULTIVITAMIN WITH  MINERALS) TABS tablet Take 1 tablet by mouth daily.    Marland Kitchen oxyCODONE (ROXICODONE) 15 MG immediate release tablet Take 1 tablet (15 mg total) by mouth every 4 (four) hours as needed for pain (Hospice). 45 tablet 0  . pantoprazole (PROTONIX) 40 MG tablet Take 1 tablet (40 mg total) by mouth daily. 30 tablet 0  . polyethylene glycol (MIRALAX / GLYCOLAX) packet Take 17 g by mouth daily as needed. 14 each 0    . predniSONE (DELTASONE) 20 MG tablet TAKE ONE TABLET BY MOUTH DAILY WITH BREAKFAST. 15 tablet 0   No current facility-administered medications for this visit.    SURGICAL HISTORY:  Past Surgical History  Procedure Laterality Date  . Cyst excision    . Radiology with anesthesia N/A 05/27/2015    Procedure: RADIOLOGY WITH ANESTHESIA;  Surgeon: Medication Radiologist, MD;  Location: [facility];  Service: Radiology;  Laterality: N/A;    REVIEW OF SYSTEMS:  A comprehensive review of systems was negative except for: Constitutional: positive for fatigue Respiratory: positive for dyspnea on exertion Musculoskeletal: positive for back pain and muscle weakness   PHYSICAL EXAMINATION: General appearance: alert, cooperative and no distress Head: Normocephalic, without obvious abnormality, atraumatic Neck: no adenopathy, no JVD, supple, symmetrical, trachea midline and thyroid not enlarged, symmetric, no tenderness/mass/nodules Lymph nodes: Cervical, supraclavicular, and axillary nodes normal. Resp: clear to auscultation bilaterally Back: symmetric, no curvature. ROM normal. No CVA tenderness. Cardio: regular rate and rhythm, S1, S2 normal, no murmur, click, rub or gallop GI: soft, non-tender; bowel sounds normal; no masses,  no organomegaly Extremities: extremities normal, atraumatic, no cyanosis or edema Neurologic: Alert and oriented X 3, normal strength and tone. Normal symmetric reflexes. Normal coordination and gait  ECOG PERFORMANCE STATUS: 1 - Symptomatic but completely ambulatory  Blood pressure 71/51, pulse 98, temperature 97.6 F (36.4 C), temperature source Oral, resp. rate 18, height 6' (1.829 m), SpO2 94 %.  LABORATORY DATA: Lab Results  Component Value Date   WBC 7.5 05/29/2015   HGB 10.3* 05/29/2015   HCT 31.8* 05/29/2015   MCV 87.6 05/29/2015   PLT 174 05/29/2015      Chemistry      Component Value Date/Time   NA 137 05/29/2015 0745   NA 132* 05/15/2015 1411   K 4.2  05/29/2015 0745   K 4.0 05/15/2015 1411   CL 106 05/29/2015 0745   CO2 25 05/29/2015 0745   CO2 20* 05/15/2015 1411   BUN 12 05/29/2015 0745   BUN 12.5 05/15/2015 1411   CREATININE 0.52* 05/29/2015 0745   CREATININE 0.8 05/15/2015 1411      Component Value Date/Time   CALCIUM 8.8* 05/29/2015 0745   CALCIUM 9.3 05/15/2015 1411   ALKPHOS 66 05/26/2015 0435   ALKPHOS 78 05/15/2015 1411   AST 13* 05/26/2015 0435   AST 26 05/15/2015 1411   ALT 16* 05/26/2015 0435   ALT 58* 05/15/2015 1411   BILITOT 0.6 05/26/2015 0435   BILITOT <0.30 05/15/2015 1411       RADIOGRAPHIC STUDIES: No results found.  ASSESSMENT AND PLAN: This is a very pleasant 60 years old white male recently diagnosed with a stage IV (T3, N3, M1 B) poorly differentiated carcinoma with positive PDL 1 expression (60%) diagnosed in November 2016 status post palliative radiotherapy to metastatic cervical bone lesion. He was started on treatment with Nat Math (pembrolizumab) status post 1 cycle on 04/24/2015 but this was discontinued based on the patient is requested and was referred to hospice service. The patient  is interested in looking for other treatment options. I explained to him that I would consider him again for treatment with Nat Math (pembrolizumab) if he decided to proceed with systemic therapy. I also the option of referral for a second opinion at [facility] with Dr. Durenda Hurt. The patient is interested in the second opinion. For now he will continue with hospice unless he is interested in future treatment. For decubitus ulcer, he is currently managed by the hospice service and was treated recently with a course of antibiotics by Dr. Sonny Dandy. I will continue to see the patient an as-needed basis at this point. The patient was advised to call immediately if he has any concerning symptoms in the interval. The patient voices understanding of current disease status and treatment options and is in agreement with the  current care plan.  All questions were answered. The patient knows to call the clinic with any problems, questions or concerns. We can certainly see the patient much sooner if necessary.  Disclaimer: This note was dictated with voice recognition software. Similar sounding words can inadvertently be transcribed and may not be corrected upon review.

## 2015-07-19 ENCOUNTER — Telehealth: Payer: Self-pay | Admitting: Internal Medicine

## 2015-07-19 NOTE — Telephone Encounter (Signed)
Called Dr. Vertell Limber office to schedule second opinion.  Before they could schedule the appt they need to verify ins.  If insurance approved Dr. Vertell Limber office will call pt with appt.

## 2015-07-25 ENCOUNTER — Telehealth: Payer: Self-pay | Admitting: Medical Oncology

## 2015-07-25 NOTE — Telephone Encounter (Signed)
Pt death 

## 2015-07-26 DEATH — deceased

## 2015-09-14 ENCOUNTER — Encounter: Payer: Self-pay | Admitting: Internal Medicine

## 2015-09-14 NOTE — Progress Notes (Signed)
Forms faxed 06/26/15 I sent to medical records-pat expired

## 2015-11-14 ENCOUNTER — Other Ambulatory Visit: Payer: Self-pay | Admitting: Nurse Practitioner

## 2016-01-30 IMAGING — CT CT ABD-PELV W/ CM
2 of 5 series · 15 of 46 positions shown, 17 images · IV contrast (Omnipaque 300)
Comparison: PET-CT dated 03/28/2015.  CTA chest dated 05/09/2015.

CLINICAL DATA: Suprapubic pain, urinary retention x5 days. Newly
diagnosed lung cancer with metastases to adrenal glands and lymph
nodes.

EXAM:
CT ABDOMEN AND PELVIS WITH CONTRAST
TECHNIQUE: Multidetector CT imaging of the abdomen and pelvis was performed
using the standard protocol following bolus administration of
intravenous contrast.
CONTRAST:  100mL OMNIPAQUE IOHEXOL 300 MG/ML  SOLN

[Series 2: abd_pel_with 5.0 b40f · axial · 0.64mm/px · z∈[-502,-102]mm · 12 of 90 slices shown, 14 images]
[im 5/90  soft-tissue]
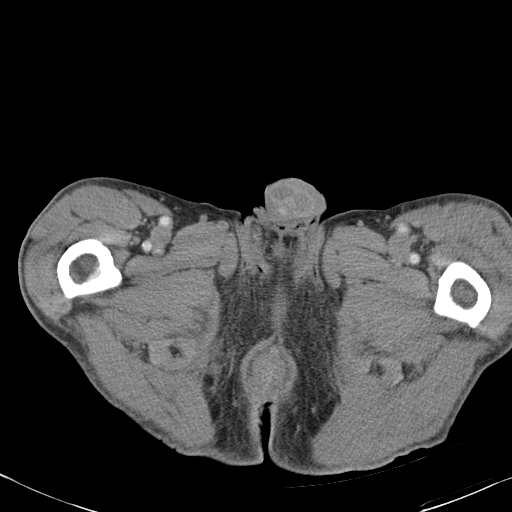
[im 5/90  bone]
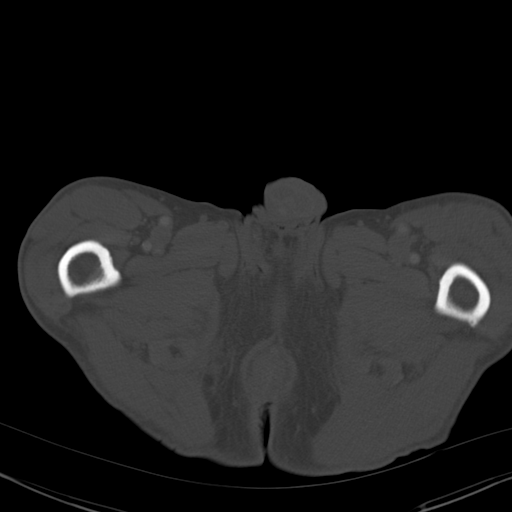
[im 15/90  soft-tissue]
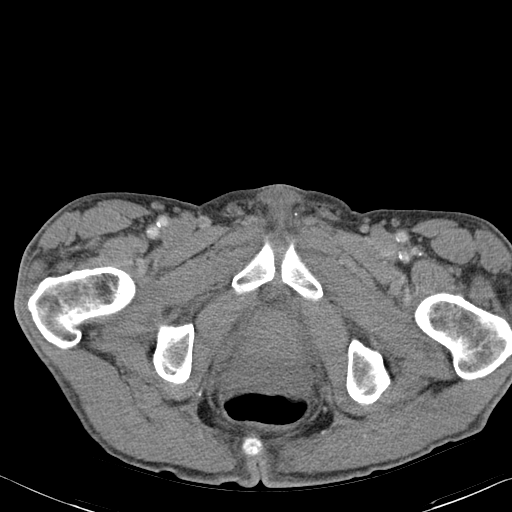
[im 19/90  soft-tissue]
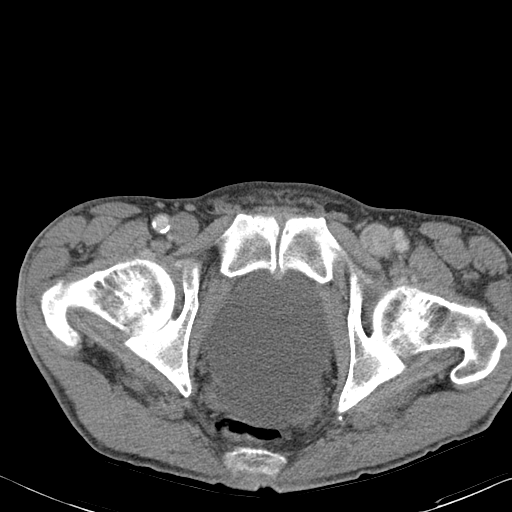
[im 29/90  soft-tissue]
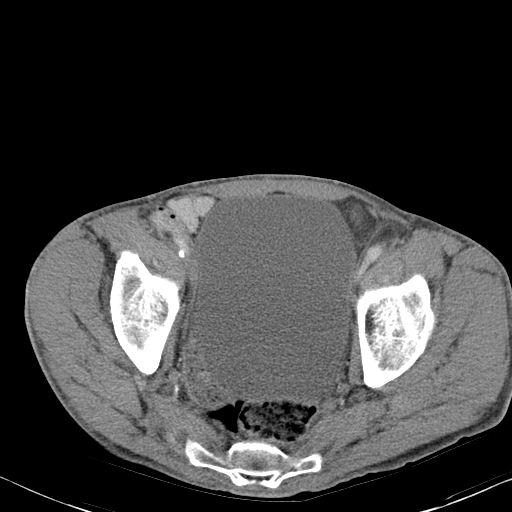
[im 33/90  soft-tissue]
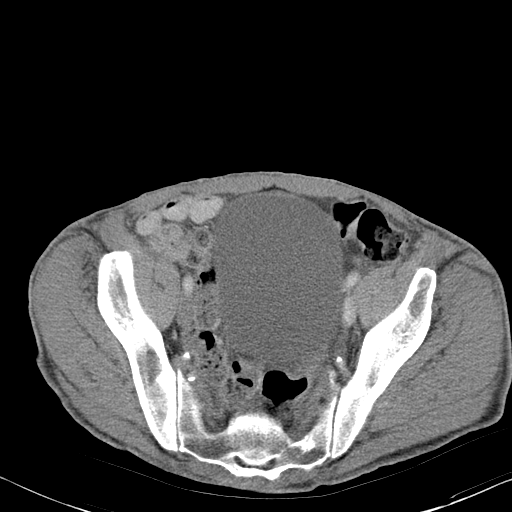
[im 43/90  soft-tissue]
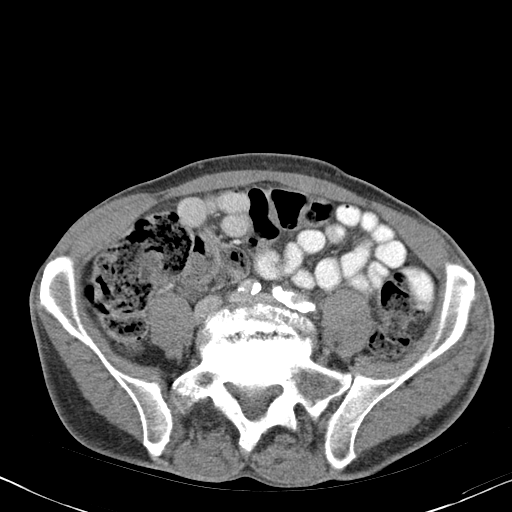
[im 47/90  soft-tissue]
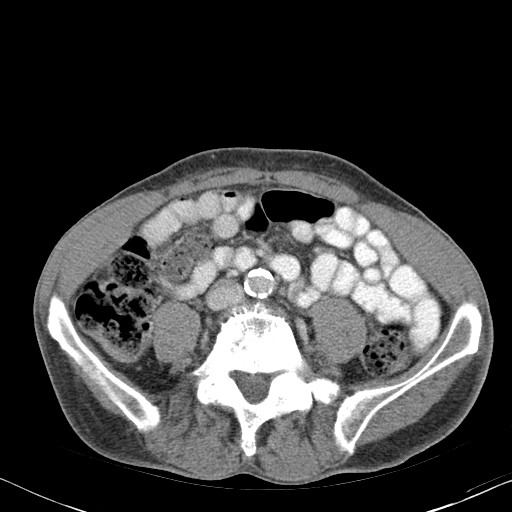
[im 57/90  soft-tissue]
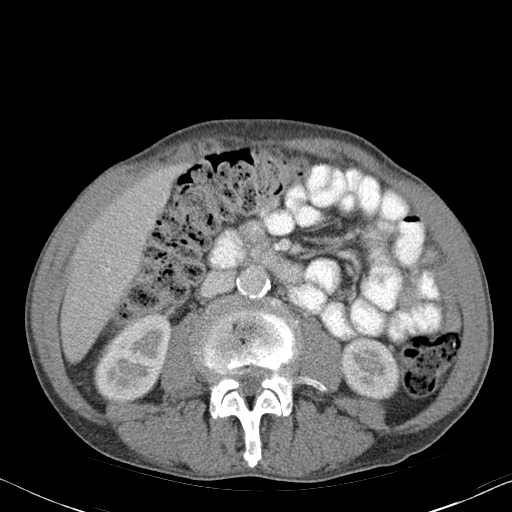
[im 61/90  soft-tissue]
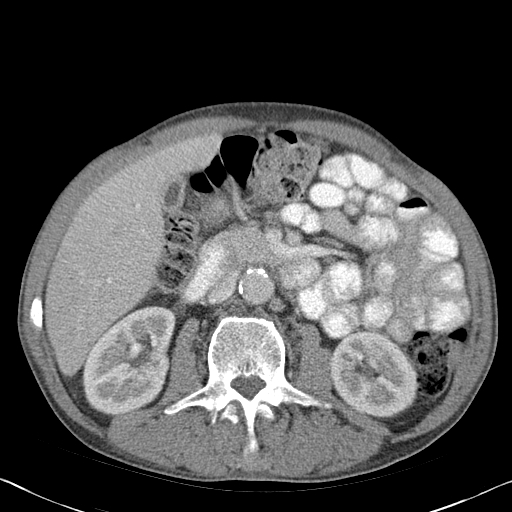
[im 61/90  bone]
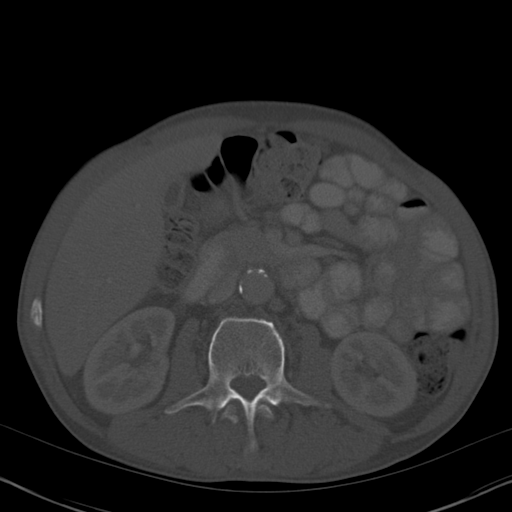
[im 71/90  soft-tissue]
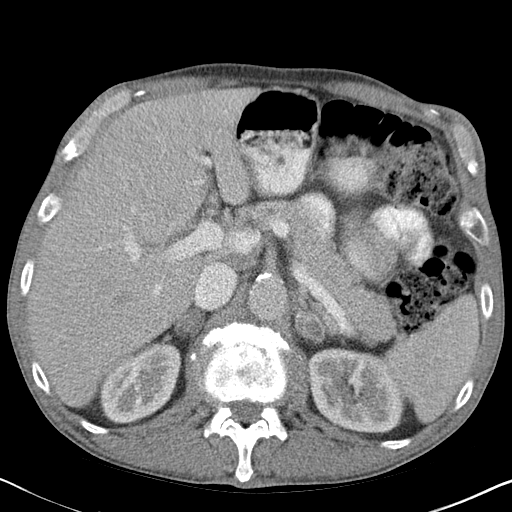
[im 75/90  soft-tissue]
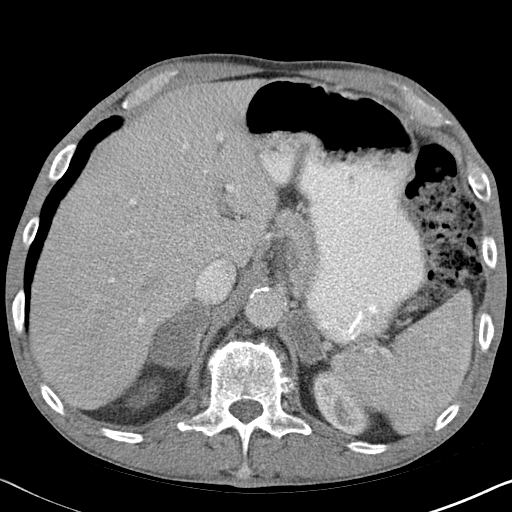
[im 85/90  soft-tissue]
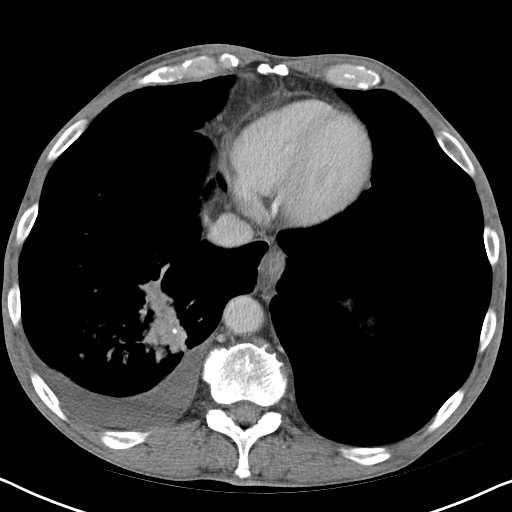

[Series 4: abd_pel_with 3.0 spo · coronal · 0.66mm/px · 3 of 92 slices shown]
[im 31/92  soft-tissue]
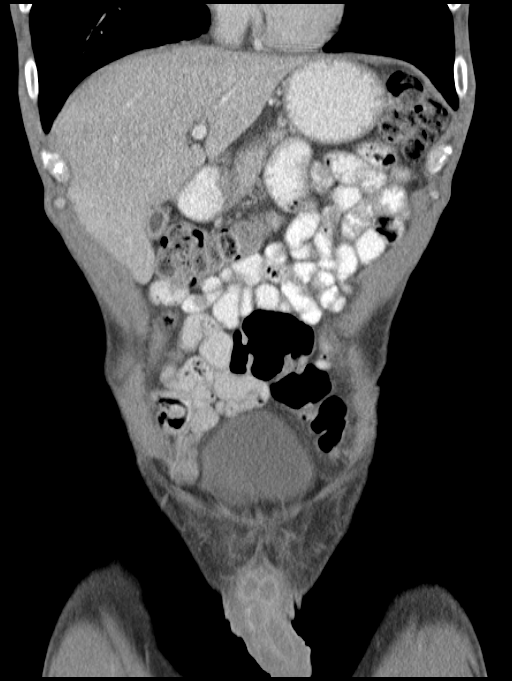
[im 41/92  soft-tissue]
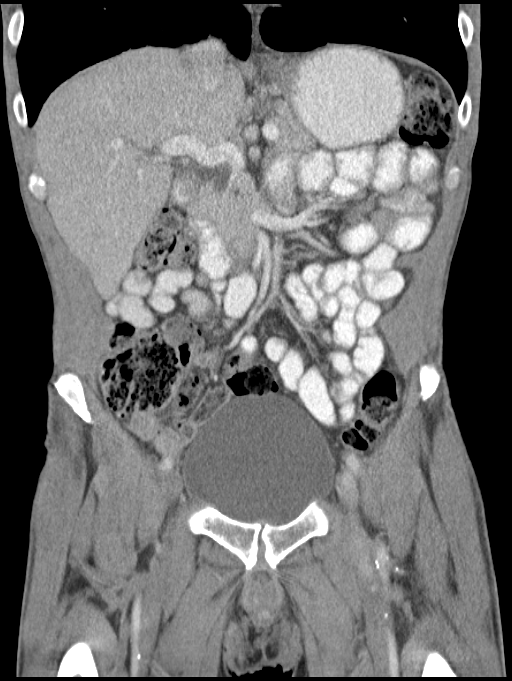
[im 51/92  soft-tissue]
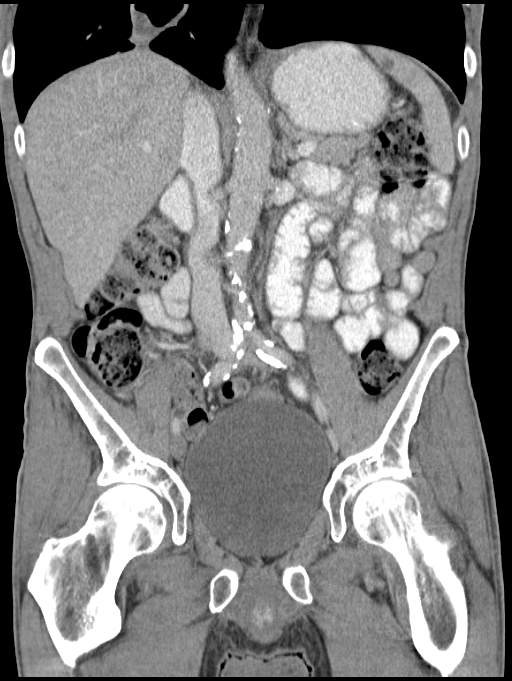

[15 of 46 positions shown; findings below may reference images not displayed]

FINDINGS: Lower chest: Thick-walled cavitary mass with air-fluid level in the
right lower lobe, measuring 5.3 x 4.4 cm, previously 8.1 x 7.5 cm on
recent CT chest.

Additional 2.7 x 3.0 cm satellite nodule inferiorly (series 3/ image
7) with associated probable lymphangitic carcinomatosis in the right
lower lobe (series 3/image 7).

Small right pleural effusion.

Hepatobiliary: Liver is within normal limits.

Gallbladder is unremarkable. No intrahepatic or extrahepatic ductal
dilatation.

Pancreas: Within normal limits.

Spleen: Two lesions in the anterior spleen measuring up to 1.5 cm,
previously 2.2 cm, at least one of which was hypermetabolic and
likely reflects a metastasis.

Adrenals/Urinary Tract: Bilateral adrenal metastases, measuring
x 3.1 cm on the right (previously 5.2 cm) and 2.8 x 1.7 cm on the
left (previously 3.6 cm).

Kidneys are within normal limits.  No hydronephrosis.

Bladder is moderately distended.

Stomach/Bowel: Stomach is within normal limits.

No evidence of bowel obstruction.

Appendix is not discretely visualized.

Vascular/Lymphatic: Atherosclerotic calcifications of the abdominal
aorta and branch vessels.

12 mm short axis gastrohepatic node (series 2/ image 15), previously
19 mm.

9 mm short axis portacaval node (series 2/image 21) and 7 mm short
axis left para-aortic node (series 2/ image 29), within the upper
limits of normal.

Reproductive: Mild prostatomegaly.

Other: No abdominopelvic ascites.

Musculoskeletal: Degenerative changes of the visualized
thoracolumbar spine, most prominent at L5-S1.
IMPRESSION: Bladder is moderately distended.  Mild prostatomegaly.

5.3 x 4.4 cm thick-walled cavitary mass in the right lower lobe,
corresponding to known primary bronchogenic neoplasm, decreased.

Associated 3.0 cm satellite nodule in the right lower lobe with
probable lymphangitic carcinomatosis. Small right pleural effusion.

Bilateral adrenal and splenic metastases, improved. Gastrohepatic
nodal metastasis, improved.

## 2020-12-20 ENCOUNTER — Other Ambulatory Visit (HOSPITAL_COMMUNITY): Payer: Self-pay
